# Patient Record
Sex: Female | Born: 1975 | Race: Black or African American | Hispanic: No | Marital: Single | Smoking: Current every day smoker
Health system: Southern US, Community
[De-identification: ages and names within clinical notes are randomized; demographics above are authoritative.]

## PROBLEM LIST (undated history)

## (undated) ENCOUNTER — Emergency Department (HOSPITAL_BASED_OUTPATIENT_CLINIC_OR_DEPARTMENT_OTHER): Admission: EM | Payer: Self-pay | Source: Home / Self Care

## (undated) DIAGNOSIS — G629 Polyneuropathy, unspecified: Secondary | ICD-10-CM

## (undated) DIAGNOSIS — I10 Essential (primary) hypertension: Secondary | ICD-10-CM

## (undated) DIAGNOSIS — E7800 Pure hypercholesterolemia, unspecified: Secondary | ICD-10-CM

## (undated) DIAGNOSIS — R569 Unspecified convulsions: Secondary | ICD-10-CM

## (undated) DIAGNOSIS — E079 Disorder of thyroid, unspecified: Secondary | ICD-10-CM

## (undated) DIAGNOSIS — F23 Brief psychotic disorder: Secondary | ICD-10-CM

## (undated) DIAGNOSIS — F319 Bipolar disorder, unspecified: Secondary | ICD-10-CM

---

## 1997-04-14 HISTORY — PX: TUBAL LIGATION: SHX77

## 1999-05-28 ENCOUNTER — Encounter: Payer: Self-pay | Admitting: Emergency Medicine

## 1999-05-28 ENCOUNTER — Encounter: Payer: Self-pay | Admitting: Internal Medicine

## 1999-05-28 ENCOUNTER — Inpatient Hospital Stay (HOSPITAL_COMMUNITY): Admission: EM | Admit: 1999-05-28 | Discharge: 1999-05-30 | Payer: Self-pay | Admitting: Emergency Medicine

## 1999-05-29 ENCOUNTER — Encounter: Payer: Self-pay | Admitting: Internal Medicine

## 1999-05-31 ENCOUNTER — Encounter (HOSPITAL_COMMUNITY): Admission: RE | Admit: 1999-05-31 | Discharge: 1999-08-29 | Payer: Self-pay | Admitting: Dentistry

## 1999-06-03 ENCOUNTER — Encounter: Admission: RE | Admit: 1999-06-03 | Discharge: 1999-06-03 | Payer: Self-pay | Admitting: Internal Medicine

## 1999-06-05 ENCOUNTER — Encounter: Admission: RE | Admit: 1999-06-05 | Discharge: 1999-06-05 | Payer: Self-pay | Admitting: Internal Medicine

## 1999-06-05 ENCOUNTER — Ambulatory Visit (HOSPITAL_COMMUNITY): Admission: RE | Admit: 1999-06-05 | Discharge: 1999-06-05 | Payer: Self-pay | Admitting: Internal Medicine

## 1999-06-05 ENCOUNTER — Encounter: Payer: Self-pay | Admitting: Internal Medicine

## 1999-06-05 ENCOUNTER — Inpatient Hospital Stay (HOSPITAL_COMMUNITY): Admission: EM | Admit: 1999-06-05 | Discharge: 1999-06-07 | Payer: Self-pay | Admitting: Emergency Medicine

## 1999-09-14 ENCOUNTER — Inpatient Hospital Stay (HOSPITAL_COMMUNITY): Admission: EM | Admit: 1999-09-14 | Discharge: 1999-09-17 | Payer: Self-pay | Admitting: Emergency Medicine

## 1999-09-25 ENCOUNTER — Encounter: Admission: RE | Admit: 1999-09-25 | Discharge: 1999-09-25 | Payer: Self-pay | Admitting: Hematology and Oncology

## 1999-10-07 ENCOUNTER — Encounter: Admission: RE | Admit: 1999-10-07 | Discharge: 1999-10-07 | Payer: Self-pay | Admitting: Internal Medicine

## 1999-10-30 ENCOUNTER — Encounter (HOSPITAL_COMMUNITY): Admission: RE | Admit: 1999-10-30 | Discharge: 2000-01-28 | Payer: Self-pay | Admitting: Dentistry

## 1999-10-31 ENCOUNTER — Inpatient Hospital Stay (HOSPITAL_COMMUNITY): Admission: AD | Admit: 1999-10-31 | Discharge: 1999-11-05 | Payer: Self-pay | Admitting: Hematology and Oncology

## 1999-10-31 ENCOUNTER — Encounter: Payer: Self-pay | Admitting: Internal Medicine

## 1999-10-31 ENCOUNTER — Encounter: Admission: RE | Admit: 1999-10-31 | Discharge: 1999-10-31 | Payer: Self-pay | Admitting: Hematology and Oncology

## 1999-10-31 ENCOUNTER — Ambulatory Visit (HOSPITAL_COMMUNITY): Admission: RE | Admit: 1999-10-31 | Discharge: 1999-10-31 | Payer: Self-pay | Admitting: Hematology and Oncology

## 1999-12-12 ENCOUNTER — Encounter: Admission: RE | Admit: 1999-12-12 | Discharge: 1999-12-12 | Payer: Self-pay | Admitting: Hematology and Oncology

## 1999-12-27 ENCOUNTER — Encounter: Admission: RE | Admit: 1999-12-27 | Discharge: 1999-12-27 | Payer: Self-pay | Admitting: Internal Medicine

## 2000-02-05 ENCOUNTER — Inpatient Hospital Stay (HOSPITAL_COMMUNITY): Admission: EM | Admit: 2000-02-05 | Discharge: 2000-02-09 | Payer: Self-pay | Admitting: Emergency Medicine

## 2000-02-05 ENCOUNTER — Encounter: Payer: Self-pay | Admitting: Emergency Medicine

## 2000-02-20 ENCOUNTER — Encounter: Admission: RE | Admit: 2000-02-20 | Discharge: 2000-02-20 | Payer: Self-pay | Admitting: Internal Medicine

## 2000-02-27 ENCOUNTER — Encounter: Admission: RE | Admit: 2000-02-27 | Discharge: 2000-02-27 | Payer: Self-pay | Admitting: Hematology and Oncology

## 2000-03-12 ENCOUNTER — Encounter: Admission: RE | Admit: 2000-03-12 | Discharge: 2000-03-12 | Payer: Self-pay | Admitting: Internal Medicine

## 2000-05-19 ENCOUNTER — Encounter: Payer: Self-pay | Admitting: Internal Medicine

## 2000-05-19 ENCOUNTER — Inpatient Hospital Stay (HOSPITAL_COMMUNITY): Admission: EM | Admit: 2000-05-19 | Discharge: 2000-05-23 | Payer: Self-pay | Admitting: Emergency Medicine

## 2000-05-19 ENCOUNTER — Encounter: Payer: Self-pay | Admitting: Emergency Medicine

## 2000-06-04 ENCOUNTER — Encounter: Admission: RE | Admit: 2000-06-04 | Discharge: 2000-06-04 | Payer: Self-pay | Admitting: Internal Medicine

## 2000-06-08 ENCOUNTER — Encounter: Payer: Self-pay | Admitting: Internal Medicine

## 2000-06-08 ENCOUNTER — Ambulatory Visit (HOSPITAL_COMMUNITY): Admission: RE | Admit: 2000-06-08 | Discharge: 2000-06-08 | Payer: Self-pay | Admitting: Internal Medicine

## 2000-06-11 ENCOUNTER — Encounter (HOSPITAL_COMMUNITY): Admission: RE | Admit: 2000-06-11 | Discharge: 2000-09-09 | Payer: Self-pay | Admitting: Dentistry

## 2000-08-31 ENCOUNTER — Encounter: Admission: RE | Admit: 2000-08-31 | Discharge: 2000-08-31 | Payer: Self-pay | Admitting: Internal Medicine

## 2000-09-01 ENCOUNTER — Encounter: Admission: RE | Admit: 2000-09-01 | Discharge: 2000-09-01 | Payer: Self-pay | Admitting: Hematology and Oncology

## 2000-09-01 ENCOUNTER — Ambulatory Visit (HOSPITAL_COMMUNITY): Admission: RE | Admit: 2000-09-01 | Discharge: 2000-09-01 | Payer: Self-pay | Admitting: Hematology and Oncology

## 2000-11-11 ENCOUNTER — Ambulatory Visit (HOSPITAL_COMMUNITY): Admission: RE | Admit: 2000-11-11 | Discharge: 2000-11-11 | Payer: Self-pay | Admitting: Internal Medicine

## 2000-11-11 ENCOUNTER — Encounter: Admission: RE | Admit: 2000-11-11 | Discharge: 2000-11-11 | Payer: Self-pay | Admitting: Internal Medicine

## 2000-12-19 ENCOUNTER — Emergency Department (HOSPITAL_COMMUNITY): Admission: EM | Admit: 2000-12-19 | Discharge: 2000-12-19 | Payer: Self-pay | Admitting: Emergency Medicine

## 2001-02-26 ENCOUNTER — Emergency Department (HOSPITAL_COMMUNITY): Admission: EM | Admit: 2001-02-26 | Discharge: 2001-02-26 | Payer: Self-pay | Admitting: Emergency Medicine

## 2001-07-26 ENCOUNTER — Encounter: Admission: RE | Admit: 2001-07-26 | Discharge: 2001-07-26 | Payer: Self-pay | Admitting: Internal Medicine

## 2001-08-30 ENCOUNTER — Encounter: Admission: RE | Admit: 2001-08-30 | Discharge: 2001-08-30 | Payer: Self-pay | Admitting: Internal Medicine

## 2001-11-01 ENCOUNTER — Encounter (HOSPITAL_COMMUNITY): Admission: RE | Admit: 2001-11-01 | Discharge: 2002-01-30 | Payer: Self-pay | Admitting: Dentistry

## 2002-05-20 ENCOUNTER — Encounter: Admission: RE | Admit: 2002-05-20 | Discharge: 2002-05-20 | Payer: Self-pay | Admitting: Internal Medicine

## 2002-06-11 ENCOUNTER — Inpatient Hospital Stay (HOSPITAL_COMMUNITY): Admission: EM | Admit: 2002-06-11 | Discharge: 2002-06-13 | Payer: Self-pay | Admitting: Emergency Medicine

## 2002-06-12 ENCOUNTER — Encounter: Payer: Self-pay | Admitting: Internal Medicine

## 2002-06-12 ENCOUNTER — Encounter: Payer: Self-pay | Admitting: Emergency Medicine

## 2002-10-03 ENCOUNTER — Encounter: Admission: RE | Admit: 2002-10-03 | Discharge: 2002-10-03 | Payer: Self-pay | Admitting: Internal Medicine

## 2003-03-21 ENCOUNTER — Encounter: Admission: RE | Admit: 2003-03-21 | Discharge: 2003-03-21 | Payer: Self-pay | Admitting: Internal Medicine

## 2003-09-18 ENCOUNTER — Emergency Department (HOSPITAL_COMMUNITY): Admission: EM | Admit: 2003-09-18 | Discharge: 2003-09-18 | Payer: Self-pay | Admitting: Family Medicine

## 2003-09-22 ENCOUNTER — Encounter: Admission: RE | Admit: 2003-09-22 | Discharge: 2003-09-22 | Payer: Self-pay | Admitting: Internal Medicine

## 2003-10-13 ENCOUNTER — Encounter: Admission: RE | Admit: 2003-10-13 | Discharge: 2003-10-13 | Payer: Self-pay | Admitting: Internal Medicine

## 2004-05-15 ENCOUNTER — Ambulatory Visit: Payer: Self-pay | Admitting: Internal Medicine

## 2005-07-15 ENCOUNTER — Emergency Department (HOSPITAL_COMMUNITY): Admission: EM | Admit: 2005-07-15 | Discharge: 2005-07-15 | Payer: Self-pay | Admitting: Emergency Medicine

## 2007-04-15 HISTORY — PX: ANKLE SURGERY: SHX546

## 2009-06-13 ENCOUNTER — Emergency Department (HOSPITAL_COMMUNITY): Admission: EM | Admit: 2009-06-13 | Discharge: 2009-06-14 | Payer: Self-pay | Admitting: Emergency Medicine

## 2010-04-08 ENCOUNTER — Emergency Department (HOSPITAL_BASED_OUTPATIENT_CLINIC_OR_DEPARTMENT_OTHER)
Admission: EM | Admit: 2010-04-08 | Discharge: 2010-04-08 | Payer: Self-pay | Source: Home / Self Care | Admitting: Emergency Medicine

## 2010-04-11 ENCOUNTER — Emergency Department (HOSPITAL_BASED_OUTPATIENT_CLINIC_OR_DEPARTMENT_OTHER)
Admission: EM | Admit: 2010-04-11 | Discharge: 2010-04-11 | Payer: Self-pay | Source: Home / Self Care | Admitting: Emergency Medicine

## 2010-06-14 ENCOUNTER — Emergency Department (HOSPITAL_BASED_OUTPATIENT_CLINIC_OR_DEPARTMENT_OTHER)
Admission: EM | Admit: 2010-06-14 | Discharge: 2010-06-14 | Disposition: A | Discharge status: Home / Self Care | Payer: Self-pay | Attending: Emergency Medicine | Admitting: Emergency Medicine

## 2010-06-14 DIAGNOSIS — K089 Disorder of teeth and supporting structures, unspecified: Secondary | ICD-10-CM | POA: Insufficient documentation

## 2010-06-14 DIAGNOSIS — K047 Periapical abscess without sinus: Secondary | ICD-10-CM | POA: Insufficient documentation

## 2010-06-14 DIAGNOSIS — E1169 Type 2 diabetes mellitus with other specified complication: Secondary | ICD-10-CM | POA: Insufficient documentation

## 2010-06-14 DIAGNOSIS — F17200 Nicotine dependence, unspecified, uncomplicated: Secondary | ICD-10-CM | POA: Insufficient documentation

## 2010-06-14 DIAGNOSIS — Z794 Long term (current) use of insulin: Secondary | ICD-10-CM | POA: Insufficient documentation

## 2010-06-14 DIAGNOSIS — Z79899 Other long term (current) drug therapy: Secondary | ICD-10-CM | POA: Insufficient documentation

## 2010-06-14 LAB — DIFFERENTIAL
Basophils Absolute: 0 10*3/uL (ref 0.0–0.1)
Basophils Relative: 0 % (ref 0–1)
Eosinophils Absolute: 0 10*3/uL (ref 0.0–0.7)
Eosinophils Relative: 0 % (ref 0–5)
Lymphs Abs: 2.3 10*3/uL (ref 0.7–4.0)
Monocytes Absolute: 0.4 10*3/uL (ref 0.1–1.0)
Monocytes Relative: 5 % (ref 3–12)
Neutro Abs: 5.9 10*3/uL (ref 1.7–7.7)
Neutrophils Relative %: 69 % (ref 43–77)

## 2010-06-14 LAB — BASIC METABOLIC PANEL
BUN: 8 mg/dL (ref 6–23)
CO2: 17 mEq/L — ABNORMAL LOW (ref 19–32)
Chloride: 105 mEq/L (ref 96–112)
GFR calc Af Amer: >60 mL/min (ref >60)
GFR calc non Af Amer: >60 mL/min (ref >60)
Potassium: 4.8 mEq/L (ref 3.5–5.1)
Sodium: 136 mEq/L (ref 135–145)

## 2010-06-14 LAB — URINALYSIS, ROUTINE W REFLEX MICROSCOPIC
Hgb urine dipstick: NEGATIVE
Leukocytes, UA: NEGATIVE
Nitrite: NEGATIVE
Protein, ur: NEGATIVE mg/dL
Specific Gravity, Urine: 1.043 — ABNORMAL HIGH (ref 1.005–1.030)
Urine Glucose, Fasting: >1000 mg/dL — AB
pH: 6 (ref 5.0–8.0)

## 2010-06-14 LAB — CBC
HCT: 36.9 % (ref 36.0–46.0)
Hemoglobin: 13.1 g/dL (ref 12.0–15.0)
MCH: 30.8 pg (ref 26.0–34.0)
MCV: 86.6 fL (ref 78.0–100.0)
Platelets: 336 10*3/uL (ref 150–400)
RBC: 4.26 MIL/uL (ref 3.87–5.11)
RDW: 13.1 % (ref 11.5–15.5)
WBC: 8.6 10*3/uL (ref 4.0–10.5)

## 2010-06-14 LAB — URINE MICROSCOPIC-ADD ON

## 2010-07-03 ENCOUNTER — Emergency Department (HOSPITAL_BASED_OUTPATIENT_CLINIC_OR_DEPARTMENT_OTHER)
Admission: EM | Admit: 2010-07-03 | Discharge: 2010-07-03 | Disposition: A | Discharge status: Home / Self Care | Payer: Self-pay | Attending: Emergency Medicine | Admitting: Emergency Medicine

## 2010-07-03 DIAGNOSIS — E119 Type 2 diabetes mellitus without complications: Secondary | ICD-10-CM | POA: Insufficient documentation

## 2010-07-03 DIAGNOSIS — N764 Abscess of vulva: Secondary | ICD-10-CM | POA: Insufficient documentation

## 2010-07-08 LAB — URINE MICROSCOPIC-ADD ON

## 2010-07-08 LAB — URINALYSIS, ROUTINE W REFLEX MICROSCOPIC
Ketones, ur: NEGATIVE mg/dL
Leukocytes, UA: NEGATIVE
Nitrite: NEGATIVE
Specific Gravity, Urine: 1.037 — ABNORMAL HIGH (ref 1.005–1.030)
pH: 5.5 (ref 5.0–8.0)

## 2010-07-08 LAB — GLUCOSE, CAPILLARY: Glucose-Capillary: 109 mg/dL — ABNORMAL HIGH (ref 70–99)

## 2010-08-30 NOTE — Discharge Summary (Signed)
[place]. [facility]  Patient:    Zoe Clay, Zoe Clay                     MRN: [id number] Adm. Date:  [id number] Disc. Date: [id number] Attending:  Phifer, Harriett Sine Welcome Dictator:   Zella Ball, M.D.                           Discharge Summary  DISCHARGE DIAGNOSES: 1. Diabetic ketoacidosis. 2. Insulin-dependent diabetes mellitus. 3. Hypovolemia. 4. Graves disease. 5. 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 6. History of noncompliance. 7. Signed out against medical advice.  DISCHARGE MEDICATIONS: 1. PTU 200 mg p.o. t.i.d. 2. Propranolol 40 mg p.o. t.i.d. 3. Insulin Regular/NPH (70/30) 40 units subcu q.a.m. and 20 units subcu q.p.m.  SPECIAL DISCHARGE INSTRUCTIONS:  The patient was to check her blood sugars three times per day and she was to be called at home to schedule thyroid ablation.  PROCEDURES IN HOSPITAL:  None.  CONSULTATIONS IN HOSPITAL:  None.  PRESENTING HISTORY:  This is a 35 year old African-American female well known to the medicine service secondary to multiple admissions for diabetic ketoacidosis and thyrotoxicosis, who presented with a chief complaint of nausea, vomiting, confusion, and loss of consciousness.  The patient was discharged from [facility] the day before presentation to [facility] for a similar presentation where she was unconscious secondary to diabetic ketoacidosis.  The patient stated that she was discharged from [facility] the day before presentation, but "forgot" her medications at [facility] and therefore did not have any of her medications at home and began feeling weak and nauseous.  She eventually became comatose.  She was brought to the emergency department by her boyfriend.  For full history and physical, please see dictation of February 05, 2000.  ADMISSION LABORATORY DATA:  On initial presentation, the  patient was found to have labs of sodium 136, potassium 6.8, chloride 110, bicarbonate 4, BUN 19, and glucose greater than 700.  The initial blood gas showed a pH of 7.04, a pCO2 of 14, and a pO2 of 112.  Liver enzymes:  AST 19, ALT 20, total protein 7.1, alkaline phosphatase 227, total bilirubin 2.7.  The CBC showed a white count of 13.9 and hemoglobin 11.9.  The UA showed a glucose greater than 1000, ketones greater than 800, and nothing on microscopic analysis.  The EKG and chest x-ray on admission were negative.  The patient received an amp of sodium bicarbonate in the emergency department, as well as 10 units of subcutaneous insulin and was initially placed on Glucometer with an initial drip rate of 13 units/hr.  The medicine service saw her and she was admitted for management of diabetic ketoacidosis.  HOSPITAL COURSE: #1 - DIABETIC KETOACIDOSIS:  The patient was admitted to the MICU and begun on an insulin drip of 5 units of Regular per hour.  She was also begun on PTU 100 mg p.o. q.6h.  Her progress was followed by CBGs q.1h. and BMETs q.3h.  When the patients glucose dropped to less than 300, her fluids were changed to D5 normal saline and she was replaced p.r.n. with potassium.  The patient improved over the course of the ensuing 48 hours and was transferred to a floor bed after her bicarbonate had stabilized over 15.  She was begun then on Insulin 70/30 40 units q.a.m. and 20 units  q.p.m.  She was doing quite well, however, the patient then decided to sign out against medical advice shortly after transfer to a regular bed.  She was advised to continue to take her Insulin 70/30 and to follow up as soon as possible as an outpatient.  She was given a prescription for the 70/30 prior to discharge.  #2 - GRAVES DISEASE:  The patient had been diagnosed with Graves disease in 2000 and it was felt that she was in thyroid storm on presentation as she had gone without PTU for greater  than 30 hours.  She was started on 200 mg of PTU and then given 100 mg q.6h.  Luckily there was a very similar admission to this very recently, which Fritzi Mandes, M.D., her endocrinologist, wrote a very helpful consult note, so as this admission was pretty similar to that admission, his advice on that previous admission was followed.  As stated previously, the patient did sign out AMA.  She therefore was instructed to continue her PTU 200 mg t.i.d.  It was also felt after telephone discussion with Fritzi Mandes, M.D., that the patient would most likely benefit from thyroid ablation and the patient was informed that we would call her to schedule this as an outpatient.  #3 - HISTORY OF NONCOMPLIANCE AND LOW LEVEL OF FUNCTIONING, LIKELY MILD MENTAL RETARDATION:  The patient has a history of multiple, multiple admissions for DKA.  She really seems quite unable to handle her illness and unwilling to take responsibility for it when it does occur.  This was evidenced when she did sign out against medical advice.  DISCHARGE LABORATORY DATA:  WBC 4.2, hemoglobin 8.5, platelets 280. Sodium 143, potassium 3.9, chloride 114, bicarbonate 25, glucose 137, BUN less than 3, creatinine 0.3, calcium 7.4.  TSH less than 0.019.  Alkaline phosphatase 227, ALT 20, total bilirubin 2.7, prealbumin 10.8.  The urine pregnancy test was negative. DD:  08/13/00 TD:  08/15/00 Job: [id number]

## 2010-08-30 NOTE — Discharge Summary (Signed)
[place]. [facility]  Patient:    Zoe Clay, Zoe Clay                       MRN: [id number] Adm. Date:  [id number] Disc. Date: [id number] Attending:  Alfonso Ramus Dictator:   Janyce Llanos, M.D.                           Discharge Summary  DISCHARGE DIAGNOSES: 1. Insulin-dependent diabetes mellitus with diabetic ketoacidosis. 2. Graves disease with thyrotoxicosis. 3. Major depression.  DISCHARGE MEDICATIONS: 1. Zoloft 50 mg p.o. q.d. 2. Insulin Lispro 4 units subcutaneously t.i.d. q.a.c. 3. Insulin Lantus 13 units subcutaneously q.h.s. 4. Tapazole 30 mg p.o. q.d. 5. Propranolol 60 mg p.o. b.i.d.  PROCEDURES:  None.  CONSULTATIONS:  Obtained from endocrinology regarding this patients thyrotoxicosis as well as insulin-dependent diabetes.  Dr. Criss Alvine performed this consult and recommended the change in her insulin regimen on which she was discharged.  Furthermore, he recommended the use of Tapazole with a 30% remission of Graves disease over one year.  He recommended doing this prior to considering radiation ablation of the thyroid.  ADMISSION HISTORY AND PHYSICAL:  The patient is a 35 year old African-American female with a history of multiple admissions for diabetic ketoacidosis and thyrotoxicosis who presented to the outpatient clinic on the morning of admission complaining of feeling bad with abdominal pain and vomiting for the previous two days.  The patient last took her insulin the day prior to admission and has not taken any insulin since.  Since then, the patient has felt too poorly to take anything p.o. including her medications.  PAST MEDICAL HISTORY: 1. Type 1 diabetes with multiple admissions for diabetic ketoacidosis.  The    patient has been diabetic for approximately one and one-half years. 2. Graves disease with her last TSH being less than 0.019 and a free T4 of    7.98 in June 2001.  Graves disease was diagnosed in October 2000. 3.  Medicine noncompliance. 4. Iron-deficiency anemia. 5. Major depression.  FAMILY HISTORY:  The patients father died at age 46 with some sort of heart problem that was related to his thyroid dysfunction.  Her grandmother passed away with breast cancer, and her grandfather had colon cancer.  SOCIAL HISTORY:  The patient lives with her three children and sometimes with her boyfriend.  The patients mother assists with the children and also helps Randee with her medicines.  The patient denies any tobacco, alcohol, or illicit drug use.  REVIEW OF SYSTEMS:  Noncontributory.  MEDICATIONS ON ADMISSION: 1. Insulin 70/30, 27 units in the morning and 22 units at night. 2. PTU 50 mg p.o. b.i.d. 3. Propranolol 60 mg p.o. b.i.d. 4. Ferrous sulfate 325 mg p.o. b.i.d.  ALLERGIES:  She is not allergic to any medicines.  PHYSICAL EXAMINATION ON ADMISSION:  VITAL SIGNS:  Temperature 97.0, blood pressure 131/68, right-handed 133, respiratory rate 25, 100% saturated on room air.  GENERAL:  This is an ill-appearing 35 year old African-American female who is lying supine in mild distress.  HEENT:  Marked bilateral proptosis.  Pupils are equal, round and reactive to light.  Throat is clear without exudate.  NECK:  No lymphadenopathy.  CARDIOVASCULAR:  Tachycardic but regular with a normal S1 and S2.  PMI is not displaced; however, the precordium is hyperactive and bounding.  There is a 2/6 aortic systolic murmur.  There is no JVD.  RESPIRATORY:  Clear to  auscultation bilaterally.  ABDOMEN:  Positive bowel sounds.  Diffusely tender.  There is no hepatosplenomegaly, no mass, no rebound, no guarding.  EXTREMITIES:  Without edema.  NEUROLOGIC:  She is awake and oriented x 3.  Strength and sensation unable to be obtained due to the patients somnolence.  Cranial nerves II-XII intact. Babinskis downgoing bilaterally.  LABORATORY DATA:  Initial ABG on room air is pH 7.17, PC02 25.6, P02 105,  and bicarb 9.2.  Initial blood glucose was 738.  Initial potassium 5.1, sodium 128 with an anion gap greater than 28.  CBC: White count 10.4, hemoglobin 9.1, hematocrit 28.2, platelets 434, reticulocytes 1.0.  B12 was 380, iron 66, TIBC 157.  HOSPITAL COURSE:  The patient was admitted to the stepdown unit on an insulin drip for her diabetic ketoacidosis.  The drip was started at 5 units per hour; however, the anion gap closed rather quickly, and this drip was quickly tapered off, and fluids were changed to D5 normal saline over a time span of about 6 hours.  For her thyrotoxicosis, propranolol was given p.o. as well as IV.  Heart rate declined into the low 100s with a normal systolic blood pressure of 120 to 130.  Endocrinology consult was obtained for her insulin-dependent diabetes as well as her Graves disease and thyrotoxicosis.  Dr. Criss Alvine recommended changing her insulin regimen to insulin Lantus as well as Lispro and suggested that we try a years therapy on Tapazole instead of PTU which may offer up to 30% remission over the course of one year.  He recommended doing this prior to considering radioactive ablation of the patients thyroid.  The patient is to followup with Dr. Felicity Coyer in the clinic.  Since medication noncompliance has been such an issue with this patient, radioactive thyroid ablation would not be a bad option.  DISCHARGE LABORATORY DATA:  Sodium 132, potassium 3.8, chloride 97, bicarb 29, BUN 8, creatinine 0.4, glucose 265.  White count 7.3, hemoglobin 11.4, hematocrit 34.7, platelets 330.  The patient was discharged with home health to help her with her new medication regimen, and she is to follow up with Dr. Felicity Coyer on August 16. DD:  11/11/99 TD:  11/12/99 Job: [id number]

## 2010-08-30 NOTE — Discharge Summary (Signed)
[place]. [facility]  Patient:    Zoe Clay, Zoe Clay                       MRN: [id number] Adm. Date:  05/19/00 Disc. Date: 05/23/00 Attending:  Alvester Morin, M.D. Dictator:   Doreatha Martin, M.D.                           Discharge Summary  DISCHARGE DIAGNOSES: 1. Diabetic ketoacidosis. 2. Hypoglycemia. 3. Anemia. 4. Hypothyroidism. 5. Diabetes mellitus type 1.  DISCHARGE MEDICATIONS: 1. PTU 200 mg t.i.d. 2. Propanolol 40 mg t.i.d. 3. Regular Insulin 10 units before every meal. 4. Lantis 15 units at bedtime.  HISTORY OF PRESENT ILLNESS:  Ms. Sprung is a 35 year old African-American female with a two-year history of type 1 diabetes mellitus with multiple admissions for DKA and history of Graves disease who presents with a three-day history of nausea and vomiting.  The patient states that she was in her usual state of health until three days prior to admission when she became increasingly nauseated and began having bouts of emesis.  She states that she has not missed her insulin doses and that her home CBGs were within normal limits.  However, on the morning of admission she found her CBG to be greater than 500.  At that time, the patient took an extra 10 unit dose of Regular Insulin and decided to come to the emergency room.  REVIEW OF SYSTEMS:  The patient denies any productive cough, fever, dysuria, rashes, or diarrhea.  The patient does have occasional chills and occasional stabbing chest pain and abdominal pain.  The abdominal pain began when she started having emesis.  She denies any hematemesis.  No exertional chest pain. The patient states that she has been compliant with all of her medications.  PAST MEDICAL HISTORY: 1. Type 1 diabetes mellitus diagnosed in August of 2000 with multiple    admissions for DKA and leaving against medical advice. 2. Graves disease diagnosed in September of 2000 with arrangements for    possible ablation of  the thyroid, however, patient noncompliant and    leaving against medical advice on more than one occasion. 3. History of anemia. 4. Bilateral tubal ligation in 1999. 5. History of depression. 6. History of gastritis. 7. 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  SOCIAL HISTORY:  The patient lives in [place] with fiance and children. The patient is unemployed.  She denies alcohol, tobacco use, or drug use.  FAMILY HISTORY:  Significant for a relative with diabetes mellitus.  PHYSICAL EXAMINATION:  VITAL SIGNS: Temperature 96.7, blood pressure 138/69, pulse 162, respirations 26.  GENERAL: This is a young, African-American female who is slow to respond.  Alert and oriented x 3.  HEENT: Normocephalic, atraumatic, PERRL.  Pink, moist, mucous membranes.  No sinus tenderness. Bilateral tympanic membranes clear.  NECK: No JVD.  Slightly enlarged thyroid. Supple neck.  HEART: Tachycardic with regular rhythm, no murmurs, rubs, or gallops.  LUNGS: Clear to auscultation bilaterally.  No crackles and no wheezing.  ABDOMEN: Soft, nontender, and nondistended with active bowel sounds.  EXTREMITIES: Trace lower extremity edema bilaterally.  No cords. SKIN: No rashes or ulcers or lesions.  LABORATORY DATA:  ABG; pH 7.094, pCO2 21, pO2 116, bicarb 6.  Sodium 141, potassium 4.6, chloride 114, CO2 8, BUN 14, glucose 482.  Chest x-ray clear bilaterally.  EKG sinus tachycardia with rate of 150, no ST  or T wave changes.  HOSPITAL COURSE:  #1 - DKA.  The patient was admitted to critical care unit and a central line was placed for replenishment of her fluids.  The patient responded very well to insulin drip and fluids.  Within the next 24 hours, her anion gap had closed and her bicarbonate level was 19.  As well she was taking p.o. well on the following morning.  At that time, the patient was transferred to regular bed for further monitoring.  It is felt that the DKA occurred secondary  to noncompliance with her medications as there was no evidence of infection or any other stress.  #2 - Diabetes mellitus type 1.  This patient had episodes of hypoglycemia when prescribed NPH Insulin.  She was then switched to 70/30 Insulin and she also had episodes of hypoglycemia that were asymptomatic.  Therefore, it was determined that this patient is not able to tolerate longacting insulin preparations and was placed on Regular Insulin just before meals three times a day and Lantis 15 units at bedtime.  The patient is to follow up in the [facility].  She has been instructed to seek medical care in the event that her blood sugars are elevated and/or below 60.  She has also been encouraged to comply with her medical therapy.  #3 - Hypothyroidism/Graves disease.  The patient had tachycardia at the time of admission.  It is believed that this is also secondary to noncompliance with medical prescriptions.  This could also have been secondary to stress of acute illness.  The patient was scheduled for thyroid uptake scan two to three weeks after discharge.  The patient is to continue with PTU and propanolol. She will follow up with Dr. Mady Haagensen in the outpatient clinic.  #4 - Anemia.  This patient has longstanding anemia.  There was no evidence of active bleeding at the time of admission.  This problem is to be followed by her primary care physician as an outpatient.DD:  10/06/00 TD:  10/06/00 Job: [id number]

## 2010-08-30 NOTE — H&P (Signed)
[place]. [facility]  Patient:    Zoe Clay, Zoe Clay                       MRN: [id number] Adm. Date:  [id number] Disc. Date: [id number] Attending:  Alfonso Ramus Dictator:   Milana Huntsman, M.D.                         History and Physical  DATE OF BIRTH:  06-23-1975  CHIEF COMPLAINT:  Nausea, vomiting, obtundation.  HISTORY OF PRESENT ILLNESS:  This is a 35 year old African-American female very well known to the MED B service secondary to multiple admissions for diabetic ketoacidosis and thyrotoxicosis who presents today with chief complaint of nausea, vomiting, and confusion/eventual loss of consciousness. Patient had been discharged from [facility] yesterday after being admitted there on October 21 for a similar presentation of unconsciousness secondary to diabetic ketoacidosis.  Patient states that she was discharged from [facility] yesterday morning at about 11 oclock, but she left there without her medications and, subsequently, did not have any of the medications that they gave her at home.  However, she states she did have her insulin which she continued to take when she was home. She states she took Lantus insulin 10 units q.h.s. and Humalog 5 units q.a.c. She said the only medication that she believes she missed was her thyroid medications of which she was unclear what she should be taking.  Patient states that she had been checking her blood sugars at home and they had been in the 200 to 300 range yesterday evening but then decreased to about 100 overnight and, when her boyfriend left for work this morning at approximately 6 oclock, she was feeling still fairly well.  However, she states she did take her morning insulin dose but, then at about 9 oclock in the morning, she said she started to get confused, began feeling nauseous and vomiting, and these symptoms continued until she decided to  present to the [facility] emergency department at about 1 p.m.  She then called her boyfriend who brought her to the emergency department and, by the time she arrived at the [facility] emergency department, she was unconscious.  This patients recent history is significant for her type 1 diabetes diagnosed in August 2000 and Graves disease in September 2000 since which time she has been admitted apparently almost monthly either at this hospital, [facility], or at [facility].  In reading through her old charts, there seems to be some concern that she is either noncompliant with her medications or unable to fully understand the medication regimen that she needs to be on.  At any rate, she is hospitalized for this problem quite often.  PAST MEDICAL HISTORY: 1. Type 1 diabetes diagnosed in August 2000 with multiple DKA admissions as    stated above. 2. Graves disease diagnosed in September 2000. 3. History of anemia. 4. Depression. 5. History of gastritis.  PAST SURGICAL HISTORY:  Patient is status post bilateral tubal ligation.  MEDICATIONS:  Some conflicting information here between the patient and her [facility] history and physical, but, per the patient, she is taking Lantus 10 units q.h.s. and Humalog 5 units q.a.c.  She does not know what her thyroid medications are.  Per [facility] records, her medications are: 1. Lente 10 units. 2. Humalog 25 units q.a.c. 3.  Propythiouracil or PTU 15 mg q.i.d. 4. Propranolol 40 mg t.i.d. 5. Pepcid 20 mg b.i.d.  SOCIAL HISTORY:  Patient lives in [place] with her three children.  Her mother lives nearby and looks in on her and apparently helps her with her medication.  She has a boyfriend who is the father of one of her children and she has been unemployed for the past several months secondary to her illnesses.  She denies tobacco, alcohol, or illicit drug use.  FAMILY HISTORY:  Father died of  diabetes-related complications in his 21s.  PHYSICAL EXAMINATION:  VITAL SIGNS:  Temperature 97.8, blood pressure 121/58, pulse 150, respiratory rate 28.  Patient saturating 100% on room air.  GENERAL:  This is a lethargic African-American female with slurred speech who will verbally respond to questions when asked.  HEENT:  Pupils equal and reactive to light, positive ophthalmus and mucous membranes pink, somewhat dry.  NECK:  No JVD, slight goiter.  CARDIOVASCULAR:  Tachycardic, regular rhythm, no murmurs, rubs, or gallops noted.  LUNGS:  Clear to auscultation bilaterally.  ABDOMEN:  Soft without guarding, positive bowel sounds, slightly tender to palpation on upper midline.  EXTREMITIES:  Trace edema.  LABORATORIES ON PRESENTATION:  Patients initial i-STAT showed a sodium 136, potassium 6.8, chloride 110, bicarbonate 4, BUN 19, and glucose greater than 700.  Repeat for official was sodium 135, potassium 6.6, chloride 103, bicarbonate less than 10, BUN 21, creatinine 1.2, and glucose 747.  Initial blood gas showed a pH of 7.04, PCO2 of 14.2, and a PO2 of 112.  Liver enzymes: AST 19, ALT 20, total protein 7.1, alkaline phosphatase 227, total bilirubin 2.7.  CBC:  White count of 13.6, hemoglobin 11.9, platelets of 516. Urinalysis:  Glucose greater than 1000, ketones greater than 80.  Nothing on micro.  EKG showed sinus tachycardia.  Chest x-ray showed no acute distress.  EMERGENCY DEPARTMENT COURSE:  Patient was given one amp of sodium bicarbonate in the emergency department as well as 10 units of subcutaneous insulin and placed on the Glucomander initially at a drip rate of 13 units an hour.  ASSESSMENT AND PLAN:  This is a 35 year old African-American female with diabetic ketoacidosis with thyrotoxicosis who was discharged yesterday from [facility] for the same illness.  Problem 1. Diabetic ketoacidosis.  Patients serum blood sugars are trending down,  now less than 200.  So, when patient is in the 250 to 300 range, will start D5 half normal  saline as well as potassium and will follow capillary blood glucoses with q. hour capillary blood glucose checks and q. three hour basic metabolic panels. Patient has already had roughly 4 L of fluid and has a good urine output, so we will decrease her IV fluid rate to 400 cc per hour.  Problem 2. Thyrotoxicosis.  Impression is that her diabetic ketoacidosis was precipitated by her thyrotoxicosis as she has been apparently without her PTU for greater than 30 hours.  We gave her one 200 mg dose of PTU in the emergency department and now plan to give her 100 mg q.6h. until she has normalized.  On a previous admission, Dr. Criss Alvine saw the patient and wrote out a differential of continued medical management of her thyrotoxicosis versus thyroid ablation therapy, and it seems that this ablation therapy might be something that we should address with the patient during this hospitalization.  However, with her history of noncompliance, this could be a problem and will have to really look into  the strength of her social support and if she would be able to take thyroid medications on a regular basis, unclear which way would be worse for her. Problem 3. Slightly increased white count.  Apparently, patient was discharged from [facility] with Ciprofloxacin.  However, it is unclear why she had that.  There is nothing on urinalysis to suggest urinary tract infection; however, we do have blood cultures pending and will follow her left shift as her diabetic ketoacidosis resolves.  Problem 4. Slightly increased creatinine/ increased alkaline phosphatase/increased total bilirubin.  Believe this is secondary to dehydration and will follow by labs. DD:  02/05/00 TD:  02/05/00 Job: [id number]

## 2010-08-30 NOTE — Discharge Summary (Signed)
[place]. [facility]  Patient:    Zoe Clay, Zoe Clay                       MRN: [id number] Adm. Date:  [id number] Disc. Date: [id number] Attending:  Madaline Clay Dictator:   Zoe Clay CC:         Zoe Clay/outpatient clinic             Zoe Clay, M.D./dental medicine, [facility]                           Discharge Summary  DISCHARGE DIAGNOSES: 1. Diabetes mellitus, type 1, with diabetic ketoacidosis. 2. Hyperthyroidism. 3. Dental abscess. 4. Normocytic anemia with hemoglobin 10.8.  DISCHARGE MEDICATIONS: 1. Amoxicillin 500 mg p.o. t.i.d. x 12 days for a total of 14 days of treatment. 2. Propranolol 60 mg p.o. t.i.d. 3. Propylthiouracil (PTU) 100 mg p.o. t.i.d. 4. Ramipril 2.5 mg p.o. q.d. 5. 70/30 insulin 20 units q.a.m. and 15 units q.p.m. 6. ______ rinse mouth t.i.d. until bottle completed. 7. Darvocet-N 100 one p.o. q.6h. p.r.n. pain, dispensed #15.  SPECIAL INSTRUCTIONS: 1. Diabetic diet, as discussed, with hospital nutritionist. 2. Follow-up:  Zoe Clay on Monday, 06/03/99 at 2 p.m. in the [facility] clinic. 3. Appointment w/Zoe Clay on Thursday, 06/20/99, at 4 p.m. in [facility] clinic 4. Appointment w/Dr. Kristin Clay, 06/05/99, at 10:15 a.m. in dental medicine clinic. 5. [facility] on 06/04/99 at 10 a.m.  PROCEDURES: 1. Panorex on 05/29/99 revealed periapical abscesses forming at the roots of the right first mandibular molar with the presence of multiple dental caries.  2. Two-view PA and lateral chest x-ray on 05/28/99 revealed a normal chest x-ray.  3. Right femoral line placement on 05/28/99.  Please refer to Dr. ______s note n the hospital chart.  HISTORY OF PRESENT ILLNESS:  Zoe Clay is a 35 year old African-American female  with newly diagnosed insulin dependent diabetes mellitus and hyperthyroidism. he patient presented two weeks ago, prior to admission at [facility] with chief complaint  of weakness and palpitation.  The patient also reported a 0- pound weight loss over the past 1-1/2 months.  She also complained of intermittent shortness of breath, nausea with one episode of emesis prior to admission.  The  patient also complained of intense diaphoresis, nervousness and heat intolerance. Zoe Clay further complaints included a toothache, which she said had been present for the past two days.  She denied any fever, chills, night sweats, chest pain,  abdominal pain, hematemesis, melena, hematochezia, diarrhea or dysuria.  PHYSICAL EXAMINATION:  GENERAL:  She is a cachectic, lethargic and ill-appearing African-American female with prominent proptosis.  VITAL SIGNS:  Temperature 97.3, blood pressure 143/78, pulse 108 to 152, respirations 18 and pulse ox 99% on room air.  HEENT:  Normocephalic, atraumatic.  Pupils equal, round and reactive to light and accommodation; extraocular movements intact.  Positive for conjunctival pallor, and icteric.  Oropharynx revealed dry mucous membranes.  No ulcers or exudate.  NECK:  Supple, without any lymphadenopathy; positive for a smooth nontender thyroid goiter.  No bruits.  LUNGS:  Clear to auscultation bilaterally.  CARDIOVASCULAR:  Revealed prominent tachycardia; no evidence of murmurs, gallops or rubs.  ABDOMEN:  Soft, nontender/nondistended, with decreased bowel sounds.  No hepato- splenomegaly appreciated.  EXTREMITIES:  No edema; nontender, with 1+ dorsalis pedis and posterior tibial bilaterally.  NEUROLOGIC:  She was alert and oriented x 3.  Cranial nerves were grossly intact; exam was nonfocal.  ADMISSION LABORATORY DATA SUMMARY:  Sodium 129, potassium 4.6, chloride 102, CO2 was 14, glucose 446, BUN 14, creatinine 0.8, calcium 9.0 and Total protein 6.6,  albumin 3.1, AST 27, ALT 30, alk phos 142, Total bilirubin 1.6.  Admission EKG revealed sinus tachycardia with a heart rate of 153.  Serum acetone  was read as moderate.  HbA1C of 11.2.  Serial CK and CM MB are as follows: CK #1 was 19 and MB 1.1 with Troponin-I less than 0.03. CK #2 was 19 and MB 0.8 with Troponin-I less than 0.03. CK #3 was 19 and MB 0.6 with Troponin-I ______. CK #4 was 17 and MB 0.3 with Troponin-I less than 0.03.  TSH less than 0.019, free T4 was 2.32 (reference range 0.89 to 1.80) and T3 was  285.0 (reference range 60.0 to 181.0).  Urine pregnancy negative.  Urine drug screen negative.  Serial BMPs can be referenced in the laboratory section of the chart.  Urinalysis revealed glucose greater than 1000 and ketones greater than 80; other- wise, within normal limits.  BMP from the morning of 05/30/99: sodium 138, potassium 3.3, chloride 111, CO2 as 25, BUN 4, creatinine 0.3, glucose 276.  HOSPITAL COURSE:  By problem: 1. Diabetic ketoacidosis. The patient presented in diabetic ketoacidosis as evidenced by her bicarbonate level of 14 with an anion gap of 13.  The patient was admitted to the ICU where she was given IV fluids for rehydration and was started on an insulin drip.  The patient was monitored with q.1h. capillary blood glucoses and q.2h. BMPs.  The patient responded to the aforementioned intervention.  The patients bicarbonate  returned to normal as indicated, and her listed labs and her anion gap closed. The patients diabetic ketoacidosis resolved with the fluid resuscitation and insulin drip.  The patient was switched to her home insulin schedule once she had corrected.  The patient tolerated a right femoral line placement for her fluid rehydration without any problems.  The patient did develop a transient hypokalemia with correction of her DKA, which was corrected with p.o. potassium chloride replacement.  The patient underwent extensive diabetic counseling and teaching during her hospital course.  Our hospital nutrition team discussed her diabetes with her in detail.  I also  discussed the diagnosis of diabetes and the lifestyle changes that  were required to maintain good health with diabetes.  I also was able to discuss this with Ms. Ventresca mother, who stated that she understood the importance of appropriate medical follow-up and insulin treatment.  The patient was scheduled  continued outpatient education with our diabetic management team, as listed in he follow-up section.  2. Hyperthyroidism. The patient had clinical findings suggestive of Graves hyperthyroidism.  The patients blood pressure and heart rate were controlled with Inderal 60 mg p.o. t.i.d.  She was also continued on her admission PTU dosages as were started at [facility].  I instructed the patient to continue her Inderal at the listed dose, but to closely monitor her pulse and told her to be wary of any symptoms of hypotension, including light-headedness.  The patient stated she would contact me if she had any of the aforementioned symptoms.  3. Dental abscess. It is likely that Ms. Laszlo dental abscess contributed to her DKA.  The patient  was started on amoxicillin during her hospital course and had follow-up with Dr. Kristin Clay.  Thankfully, Dr. Kristin Clay with dental medicine saw Ms. Meuser and  recommended that we treat her dental abscess with amoxicillin and ______  rinse  until her infection had resolved.  He agreed to see Ms. Scheidegger in his outpatient  clinic for further treatment, including possible extraction.  Ms. Gangemi was given the follow-up with Dr. Kristin Clay, as listed above.  4. Normocytic anemia. The patient is a menstruating young female, which is not likely the etiology of her normocytic anemia.  However, Ms. Vanosdol anemia may also be influenced by her hyper- thyroidism. DD:  07/04/99 TD:  07/04/99 Job: [id number]

## 2010-08-30 NOTE — Discharge Summary (Signed)
[place]. [facility]  Patient:    Zoe Clay, Zoe Clay                       MRN: [id number] Adm. Date:  [id number] Disc. Date: [id number] Attending:  Madaline Guthrie Dictator:   Rene Paci, M.D. CC:         Madaline Guthrie, M.D.                           Discharge Summary  DISCHARGE DIAGNOSES: 1. Status post diabetic ketoacidosis. 2. Type 1 diabetes, poorly controlled. 3. Graves disease. 4. Iron deficiency anemia. 5. Depression.  DISCHARGE MEDICATIONS: 1. Insulin 70/30 25 units q.a.m. and 20 units q.p.m. 2. Propranolol 60 mg one p.o. b.i.d. 3. PTU 50 mg three tablets p.o. b.i.d. 4. Zoloft 50 mg one tablet p.o. q.h.s. 5. Iron sulfate 325 mg one tablet p.o. b.i.d.  FOLLOWUP:  Scheduled for Wednesday, June 13, at 2 p.m. at the outpatient clinic.  At that appointment, Dr. Felicity Coyer will review log of CBGs as well as symptoms of hyperthyroidism.  PROCEDURES:  None.  CONSULTATIONS:  None.  HISTORY OF PRESENT ILLNESS:  This is a 35 year old woman with diabetes diagnosed in January 2001, with history of multiple DKA episodes who presents to [facility] with complaint of vomiting numerous times the morning of admission but no hematemesis.  She has been unable to keep anything down including liquids.  She reports running out of her insulin yesterday and says she has only missed two doses.  She says that she feels cramping in her abdomen, shaking all over and like her heart is racing.  She also claims polydipsia and polyuria.  CBG this morning before coming to the emergency department was 464.  She denies any fevers, chills, chest pain, cough or pleurisy.  She denies any diarrhea, dysuria or vaginal discharge.  SOCIAL HISTORY:  She lives with her three children and boyfriend.  The mother lives near and checks in on her to help ensure medical compliance.  The mother is responsible for obtaining the patients medications.  The patient has been unemployed  since January 2001, when she was diagnosed with diabetes.  She denies any alcohol, drugs or tobacco.  PHYSICAL EXAMINATION:  GENERAL:  She is a thin, tremulous, ill-appearing young woman.  VITAL SIGNS:  Temperature 97.3, blood pressure 144/75, heart rate 140-166, respiratory rate 24, saturations 100% on room air.  HEENT:  Reveals mild to moderate proptosis with exophthalmos, pupils are equal, round and reactive to light, no lid lag.  Oropharynx is clear.  NECK:  Obvious visible goiter.  Mildly tender to palpation.  No thyroid nodules or bruits.  No LAD.  Neck is supple with full range of motion.  CARDIAC:  Tachycardic with normal S1 and S2.  LUNGS:  Clear to auscultation bilaterally.  ABDOMEN:  Soft, nontender with good bowel sounds, though the patient has voluntary guarding over the epigastric area.  EXTREMITIES:  Warm without edema.  NEUROLOGIC:  She has brisk biceps with patellar reflexes.  No gross deficits.  LABORATORY DATA AND X-RAY FINDINGS:  ABG of pH 7.22, pCO2 37, pO2 129.  Sodium 135, potassium 6.2, chloride 98, CO2 16, BUN 18, creatinine 0.9, glucose 491. Alk phos of 194, total bilirubin 2.4.  Anion gap of 21.  Amylase and lipase normal.  Urinalysis with greater than 1000 glucose, greater than 80 ketones and negative for leukocyte esterase or nitrites.  Urine  pregnancy test was negative.  HOSPITAL COURSE:  #1 - DIABETIC KETOACIDOSIS:  The patient was admitted to step-down after being given 10 units of regular IV bolus.  She was begun on an insulin drip and followed by protocol with CBGs every hour and BMP every two hours to follow her bicarb, potassium as well as glucose.  Potassium was repleted as the acidosis corrected.  Her IV fluids were changed to normal saline and CBG was less than 200.  She was rehydrated aggressively until CO2 was greater than 20 with two BMPs in a row.  The acidosis was corrected without difficulty.  #2 - DIABETES MELLITUS TYPE 1,  POORLY CONTROLLED:  The patient needed continued teaching for improved compliance with her insulin regimen.  The patient is somewhat immature and fails to take responsibility for her own disease and in fact is very depressed believing that his not fair that these diseases have happened to her.  She blames her mother for running out of insulin and much effort was spent during this hospitalization in trying to encourage the patient to take responsibility for her own wellness.  Her insulin doses were adjusted and slightly increased from the time of admission. She will now be taking 20 units in the evening as well as the previous 25 units a.m.  #3 - HYPERTHYROIDISM/GRAVES DISEASE:  This continues to be a patient problems as these symptoms of tachycardia, being jittery and heat intolerance are at times unbearable to the patient.  We felt that compliance might also be improved with b.i.d. dosing, so the biggest change made was changing her from t.i.d. dosing to b.i.d. of her PTU and Inderal.  Otherwise, the dose of medications have not been changed during this hospitalization.  #4 - DEPRESSION:  The patient previously had been prescribed Zoloft at 25 mg daily, but says that she has not been taking this medication for some weeks as she does not believe it has helped her at all.  As the patient appears to have symptoms of depression and helplessness related to her other diseases, Zoloft was restarted during this hospitalization at a higher dose of 50 mg q.d.  The patient already reports feeling better and says she will continue these medications on an outpatient basis.  #5 - ANEMIA:  Although the patient was initially heme concentrated with a hemoglobin of 13.6 secondary to dehydration, her final hemoglobin was 8.5 when the patient was stabilized.  Iron levels were checked and the patient was found to be significantly iron deficient with a low ferritin level and a high TIBC level.  Therefore,  iron sulfate was added to the patients regimen and  she will continue to take one tablet twice daily as an outpatient for improvement of her iron deficiency anemia.  Overall, the problem is likely secondary to heavy menses. DD:  09/25/99 TD:  09/29/99 Job: [id number]

## 2010-08-30 NOTE — Discharge Summary (Signed)
[place]. [facility]  Patient:    Zoe Clay, Zoe Clay                       MRN: [id number] Adm. Date:  [id number] Disc. Date: [id number] Attending:  Madaline Guthrie Dictator:   Dr. Remer Macho CC:         Dr. Mizelle/outpatient clinic             Adelene Amas. [place], M.D.             Cindra Eves, M.D./dental medicine                           Discharge Summary  DISCHARGE DIAGNOSES: 1. Diabetes mellitus, type 1 - status post diabetic ketoacidosis. 2. Hyperthyroidism. 3. Medical noncompliance. 4. Depression. 5. Poor dentition.  DISCHARGE MEDICATIONS: 1. Zoloft 25 mg p.o. q.a.m. 2. Inderal 40 mg p.o. t.i.d. 3. PTU 100 mg p.o. t.i.d. 4. 70/30 insulin 25 units q.a.m. and 15 units q.p.m. (home health nursing to assist    with medications).  HISTORY OF PRESENT ILLNESS:  Ms. Degroote is a 35 year old African-American female  with a history of diabetes mellitus type 1 with DKA and hyperthyroidism who presents with nausea, vomiting and abdominal pain along with "flesh feeling." he went to the [facility] emergency room on the evening prior to admission, with a  reported history and a CBG less than 60.  She reports she was observed for six hours in the emergency department and was then sent home.  On the morning of admission, she had presented to the dental clinic for an appointment with Dr. Kristin Bruins.  After Dr. Chucky May evaluation, he referred her to our outpatient clinic, where she was there found to be in diabetic ketoacidosis and admitted to the medical teaching service.  The patient did report polydipsia, polyuria and denied any associated dysuria.  ADMISSION PHYSICAL EXAMINATION:  GENERAL:  A thin, ill-appearing African-American female with a fruity-smelling breath.  VITAL SIGNS:  Temperature 98.1, pulse 148, respirations 24 and blood pressure 97/41.  HEENT:  Marked proptosis, extraocular movements intact, pupils equal, round and  reactive to  light and oropharynx with thickened secretions; no erythema or exudate.  NECK:  Supple, without mass.  Positive for mild thyromegaly/tenderness.  BACK:  Negative for spinal or CVA tenderness.  CARDIOVASCULAR:  Tachycardic without any evidence of murmurs, rubs or gallops.  RESPIRATORY:  Clear to auscultation bilaterally with no respiratory distress.  ABDOMEN:  Soft, positive bowel sounds; mild diffuse tenderness.  EXTREMITIES:  Trace bilateral edema.  She had full range of motion.  NEUROLOGIC:  She had increased DTRs bilaterally.  Cranial nerves II-XII are intact. She was grossly nonfocal.  LABORATORY DATA SUMMARY:  Admission BMP - sodium 126, potassium 6.2, chloride 94, CO2 was 14, BUN 15, creatinine 1.2, serum glucose 846, calcium 9.7, Total protein 7.4, albumin 3.5, AST 33, ALT 29, alk phos 138 and Total bilirubin 2.4.  Repeat BMP - sodium 134, potassium 4.3, chloride 106, CO2 was 21, BUN 7, creatinine 0.4, glucose 245, calcium 8.6.  TSH less than 0.019.  Free T4 was 2.31.  T3 uptake 47.9.  HOSPITAL COURSE:  By problem: 1. Diabetic ketoacidosis. The patient was admitted to the surgical ICU where she was treated with fluid resuscitation and an insulin drip.  The patients diabetic ketoacidosis resolved  after implementing the aforementioned therapy.  The patient quickly resolved, as noted by her second BMP.  Once  the patients anion gap and CO2 had corrected, she was restarted on her home insulin 70/30 schedule.  The patient remained stable during her hospital course and was transferred to a regular bed.  As noted from her 05/25/99 admission, the patient had been treated for the same  problems, including her diabetic ketoacidosis.  Given the frequency of her admission, we felt that she may need a psychiatric evaluation to evaluate her mental capacity and competence in regard to her self-care.  2. Hyperthyroidism. The patient was continued on her PTU and Inderal, as  noted above.  The patient remained stable throughout her hospital course with no changes in her medications made.  The patient is to follow-up with Dr. Herminio Heads in the outpatient clinic for continued management of her hyperthyroidism.  3. Poor dentition. The patient had presented to Dr. Kristin Bruins on the day of admission but was unable to undergo dental care, given her acute illness.  The patient will follow-up with r. Kulinski on an outpatient basis.  4. Psychosocial. Dr. Jeanie Sewer with psychiatry evaluated Ms. Mika at our request.  He deemed Ms. Aung incapable of taking care of herself and felt that she would need facilitated care.  Dr. Jeanie Sewer also felt that Ms. Licht was suffering from depression and  began her on Zoloft.  DISPOSITION:   The patient underwent continued diabetic teaching during her hospital course.  The patient was counseled and educated by both the diabetes coordinator as well as myself.  Given the assessment by Dr. Jeanie Sewer and our own evaluation, we felt that Ms. Weatherman would benefit from home health care.  Ms. Axelson had home health care established thanks to Social Work.  FOLLOW-UP: 1. Patient is to follow-up with [facility] on 06/27/99 at 9 .m. 2. Patient also to [facility] with [facility] on 06/28/99 at 8 .m. 3. Patient to follow-up with Dr. Herminio Heads on 06/20/99 at 4 p.m. 4. Patient was instructed to call Dr. Kristin Bruins to reschedule that appointment in    the dental medicine clinic. DD:  07/04/99 TD:  07/04/99 Job: [id number]

## 2010-08-30 NOTE — Consult Note (Signed)
[place]. [facility]  Patient:    Zoe Clay, Zoe Clay                       MRN: [id number] Proc. Date: 05/29/99 Adm. Date:  [id number] Disc. Date: [id number] Attending:  Phifer, Harriett Sine [place] CC:         Remer Macho, M.D.             Alvester Morin, M.D.                          Consultation Report  DATE OF BIRTH:  1975/10/16  REFERRING PHYSICIANS:  Remer Macho, M.D. and Alvester Morin, M.D.  HISTORY OF PRESENT ILLNESS:  Zoe Clay is a 35 year old black female referred by Remer Macho, M.D. and Alvester Morin, M.D. for a dental consultation. The  patient was admitted on May 28, 1999 with a history of heart palpitation, shortness of breath, and fatigue. Myocardial infarction was ruled out and subsequently diagnosed with hyperthyroidism and diabetic ketoacidosis. Dental consultation was requested for the evaluation of a lower right toothache.  PAST MEDICAL HISTORY: 1. Diabetes mellitus type 1.    a. Recently diagnosed at [facility] two weeks ago.    b. Diagnosis of diabetic ketoacidosis this admission, corrected this admission. 2. Hyperthyroidism, currently on propylthiouracil (PTU). 3. History of heart arrhythmia. Sinus tachycardia per EKG. 4. Status post bilateral tubal ligation in February of 2000. 5. History of anemia.  ALLERGIES/ADVERSE DRUG REACTIONS:  None known.  CURRENT MEDICATIONS: (Per MAR). 1. Potassium chloride 40 mEq x 1 dose. 2. Insulin 70/30 20 units every morning, 10 units every evening. 3. Enteric-coated aspirin 325 mg daily. 4. Altace 2.5 mg daily. 5. PTU 100 mg three times daily. 6. Inderal 60 mg three times daily. 7. Darvocet-N 100 every four hours as needed for pain. 8. Tylenol 650 mg every four to six hours as needed.  SOCIAL HISTORY:  The patient is single and lives alone. The patient has one 73-year-old child. The patient is a nonsmoker - nondrinker by report.  FAMILY HISTORY:  Father has a  history of diabetes mellitus and thyroid problems.  FUNCTIONAL ASSESSMENT:  The patient was independent for all ADLs prior to this admission.  REVIEW OF SYSTEMS:  (Reviewed from history and physical/chart - this admission nd is included in the dental consultation record.)  DENTAL HISTORY:  CHIEF COMPLAINT:  The patient complains of "lower right toothache for the last three days."  HISTORY OF PRESENT ILLNESS:  The patient gives a history of sharp, spontaneous ain from the lower right tooth (points to tooth #30) for the past three days. The patient also complains of right facial swelling at this time. Pain medication "helps a little bit" but it "aint strong enough".  The patient last saw a private dentist "a long time ago". The patient indicates  that she did have her teeth last cleaned in 1998 by a private dentist. The patient does not seek regular dental care.  LABORATORY DATA:  On May 28, 1999, the white cell count was elevated at 13.1, hemoglobin depressed at 10.8, hematocrit depressed at 33.3, and 372,000 platelets.  DENTAL EXAMINATION:  GENERAL:  The patient is a well-developed, slightly-built black female laying in the hospital bed in obvious acute pain.  VITAL SIGNS:  Blood pressure is 114/60, pulse is 98, respirations are 19, temperature is 98.2.  HEAD/NECK:  The patient has right facial swelling  and lymphadenopathy with tenderness to palpitation by her report. There are no acute TMJ symptoms or trismus by patient report.  INTRAORAL:  The patient has right buccal swelling associated with tooth #30. The patient allowed a minimal exam due to patient discomfort.  PERIODONTAL:  The patient has chronic periodontal disease with evidence of plaque and calculus accumulation/accretions, gingival recession, tooth mobility, and bone loss. I would need a further periodontal charting and full series of radiographs to evaluate more fully.  DENTITION:  The  patient has tooth #1 and 16 present as root segments. All other  teeth are grossly present and intact.  DENTAL CARIES:  The patient has multiple dental caries which are noted. I would  need a full series of radiographs and a more thorough exam to finalize treatment plan and treatment options.  ENDODONTIC:  There is a history of acute, spontaneous pain associated with tooth #30 for the past three days. There are multiple periapical radiolucencies associated with the apices of tooth #1, 16, 19, and 30.  CROWN OR BRIDGE/PROSTHODONTIC:  There is no history of crown or bridge restorations or prosthodontic restorations.  OCCLUSION:  The occlusion appears to be stable at this time.  RADIOGRAPHIC INTERPRETATION:  (Panoramic x-ray was taken was on May 29, 1999 - I would need a full series of radiographs to allow a fabrication of a comprehensive treatment plan in the future.)  Root segments #1 and 16 are noted. Incipient horizontal and vertical bone loss s noted. There is radiographic calculus which is noted. There are multiple periapical radiolucencies associated with the apices of tooth #1, 16, 19, and 30. There are multiple dental caries which are noted. There is one amalgam restoration on tooth #14 noted.  ASSESSMENT: 1. Acute, irreversible pulpitis of tooth #30 with right facial    swelling/lymphadenopathy . 2. Periapical radiolucency associated with tooth #1, 16, 19, and 30. 3. Multiple dental caries. 4. Chronic periodontal disease with incipient bone loss. 5. Gingival recession. 6. Accretions. 7. Multiple root segments #1 and 16.  PLAN/RECOMMENDATIONS: 1. Discuss the findings with Remer Macho, M.D. and Alvester Morin, M.D. as    indicated. Suggest initiation of antibiotic therapy to cover oral microorganisms    as follows: Unasyn IV, amoxicillin/Augmentin by mouth. Will follow up with    extraction of the offending teeth/tooth after resolution of the  facial swelling.     This procedure can be done as an inpatient or outpatient if the patient is to be    discharged soon. 2. Discussion of treatment options with the patient including risks, benefits, nd    complications. The patient initially just wanted to have the "tooth pulled", but    I will present root canal therapy versus extraction therapy as well as the need    for follow-up evaluation for comprehensive dental care to include a full series    of radiographs, periodontal therapy, dental restorations, crown or bridge    therapy, root canal therapy as indicated. 3. Suggest initiation of chlorhexidine rinse 0.12% - rinsing with 15 ml three times    daily at this time. DD:  05/31/99 TD:  05/31/99 Job: [id number]

## 2010-10-02 ENCOUNTER — Emergency Department (HOSPITAL_BASED_OUTPATIENT_CLINIC_OR_DEPARTMENT_OTHER)
Admission: EM | Admit: 2010-10-02 | Discharge: 2010-10-02 | Disposition: A | Discharge status: Home / Self Care | Payer: Self-pay | Attending: Emergency Medicine | Admitting: Emergency Medicine

## 2010-10-02 DIAGNOSIS — G40909 Epilepsy, unspecified, not intractable, without status epilepticus: Secondary | ICD-10-CM | POA: Insufficient documentation

## 2010-10-02 DIAGNOSIS — E119 Type 2 diabetes mellitus without complications: Secondary | ICD-10-CM | POA: Insufficient documentation

## 2010-10-02 DIAGNOSIS — N751 Abscess of Bartholin's gland: Secondary | ICD-10-CM | POA: Insufficient documentation

## 2010-11-10 ENCOUNTER — Emergency Department (HOSPITAL_BASED_OUTPATIENT_CLINIC_OR_DEPARTMENT_OTHER)
Admission: EM | Admit: 2010-11-10 | Discharge: 2010-11-10 | Disposition: A | Discharge status: Home / Self Care | Payer: Self-pay | Attending: Emergency Medicine | Admitting: Emergency Medicine

## 2010-11-10 ENCOUNTER — Encounter: Payer: Self-pay | Admitting: Emergency Medicine

## 2010-11-10 DIAGNOSIS — Z202 Contact with and (suspected) exposure to infections with a predominantly sexual mode of transmission: Secondary | ICD-10-CM | POA: Insufficient documentation

## 2010-11-10 DIAGNOSIS — E119 Type 2 diabetes mellitus without complications: Secondary | ICD-10-CM | POA: Insufficient documentation

## 2010-11-10 DIAGNOSIS — K029 Dental caries, unspecified: Secondary | ICD-10-CM | POA: Insufficient documentation

## 2010-11-10 DIAGNOSIS — A599 Trichomoniasis, unspecified: Secondary | ICD-10-CM | POA: Insufficient documentation

## 2010-11-10 DIAGNOSIS — K1370 Unspecified lesions of oral mucosa: Secondary | ICD-10-CM | POA: Insufficient documentation

## 2010-11-10 DIAGNOSIS — Z79899 Other long term (current) drug therapy: Secondary | ICD-10-CM | POA: Insufficient documentation

## 2010-11-10 DIAGNOSIS — E079 Disorder of thyroid, unspecified: Secondary | ICD-10-CM | POA: Insufficient documentation

## 2010-11-10 HISTORY — DX: Disorder of thyroid, unspecified: E07.9

## 2010-11-10 HISTORY — DX: Unspecified convulsions: R56.9

## 2010-11-10 LAB — URINALYSIS, ROUTINE W REFLEX MICROSCOPIC
Glucose, UA: >1000 mg/dL — AB
Ketones, ur: 15 mg/dL — AB
pH: 5.5 (ref 5.0–8.0)

## 2010-11-10 LAB — WET PREP, GENITAL

## 2010-11-10 LAB — URINE MICROSCOPIC-ADD ON

## 2010-11-10 MED ORDER — AZITHROMYCIN 250 MG PO TABS
1000.0000 mg | ORAL_TABLET | Freq: Once | ORAL | Status: AC
  Administered 2010-11-10: 1000 mg via ORAL

## 2010-11-10 MED ORDER — AZITHROMYCIN 250 MG PO TABS
ORAL_TABLET | ORAL | Status: AC
  Administered 2010-11-10: 1000 mg via ORAL
  Filled 2010-11-10: qty 4

## 2010-11-10 MED ORDER — AZITHROMYCIN 1 G PO PACK
1.0000 g | PACK | Freq: Once | ORAL | Status: DC
  Filled 2010-11-10: qty 1

## 2010-11-10 MED ORDER — LIDOCAINE HCL (PF) 1 % IJ SOLN
INTRAMUSCULAR | Status: AC
  Administered 2010-11-10: 2 mL
  Filled 2010-11-10: qty 5

## 2010-11-10 MED ORDER — CEFTRIAXONE SODIUM 250 MG IJ SOLR
250.0000 mg | Freq: Once | INTRAMUSCULAR | Status: AC
  Administered 2010-11-10: 250 mg via INTRAMUSCULAR
  Filled 2010-11-10: qty 250

## 2010-11-10 MED ORDER — METRONIDAZOLE 500 MG PO TABS
2000.0000 mg | ORAL_TABLET | Freq: Once | ORAL | Status: AC
  Administered 2010-11-10: 2000 mg via ORAL
  Filled 2010-11-10: qty 4

## 2010-11-10 NOTE — ED Provider Notes (Signed)
History     Chief Complaint  Patient presents with  . Vaginal Discharge  . Oral Swelling   Patient is a 35 y.o. female presenting with vaginal discharge and tooth pain. The history is provided by the patient. No language interpreter was used.  Vaginal Discharge This is a new problem. The current episode started in the past 7 days. The problem occurs constantly. The problem has been unchanged. Pertinent negatives include no abdominal pain, change in bowel habit, fever, headaches, neck pain, rash, sore throat, vomiting or weakness. The symptoms are aggravated by nothing. She has tried nothing for the symptoms.  Dental PainThe primary symptoms include mouth pain. Primary symptoms do not include headaches, fever or sore throat. The symptoms began yesterday. The symptoms are unchanged. The symptoms are new. The symptoms occur constantly.  Additional symptoms do not include: trouble swallowing. Medical issues do not include: alcohol problem.    Past Medical History  Diagnosis Date  . Diabetes mellitus   . Seizures   . Thyroid disease     Past Surgical History  Procedure Date  . Tubal ligation     History reviewed. No pertinent family history.  History  Substance Use Topics  . Smoking status: Current Everyday Smoker  . Smokeless tobacco: Not on file  . Alcohol Use: No    OB History    Grav Para Term Preterm Abortions TAB SAB Ect Mult Living                  Review of Systems  Constitutional: Negative for fever.  HENT: Negative for sore throat, trouble swallowing and neck pain.        Pt c/o left lower dental pain  Respiratory: Negative.   Gastrointestinal: Negative for vomiting, abdominal pain and change in bowel habit.  Genitourinary: Positive for vaginal discharge.  Skin: Negative for rash.  Neurological: Negative for weakness and headaches.  All other systems reviewed and are negative.    Physical Exam  LMP 11/07/2010  Physical Exam  Constitutional: She is  oriented to person, place, and time. She appears well-developed and well-nourished.  HENT:  Head: Normocephalic and atraumatic.  Mouth/Throat:    Eyes: Pupils are equal, round, and reactive to light.  Cardiovascular: Normal rate and regular rhythm.   Pulmonary/Chest: Effort normal and breath sounds normal.  Abdominal: Soft. There is no tenderness.  Genitourinary: Right adnexum displays no tenderness. Left adnexum displays no tenderness. Vaginal discharge found.       Pt has green vaginal discharge  Musculoskeletal: Normal range of motion.  Neurological: She is alert and oriented to person, place, and time.  Skin: Skin is warm and dry.  Psychiatric: She has a normal mood and affect.    ED Course  Procedures  MDM Pt treated for std here based on green discharge:educated pt:no need for any medication for the gums at this time      Teressa Lower, NP 11/10/10 1317

## 2010-11-10 NOTE — ED Notes (Signed)
Pt states she is having clear to white discharge x 3 days.  No odor.  Some itching.  No pain.   Also c/o gum pain on left lower gum area.  Denies fever.

## 2010-11-10 NOTE — ED Provider Notes (Signed)
Medical screening examination/treatment/procedure(s) were performed by non-physician practitioner and as supervising physician I was immediately available for consultation/collaboration.   Rolan Bucco, MD 11/10/10 1501

## 2010-11-11 LAB — GC/CHLAMYDIA PROBE AMP, GENITAL: Chlamydia, DNA Probe: NEGATIVE

## 2010-11-16 ENCOUNTER — Encounter (HOSPITAL_BASED_OUTPATIENT_CLINIC_OR_DEPARTMENT_OTHER): Payer: Self-pay | Admitting: Emergency Medicine

## 2010-11-16 ENCOUNTER — Emergency Department (HOSPITAL_BASED_OUTPATIENT_CLINIC_OR_DEPARTMENT_OTHER)
Admission: EM | Admit: 2010-11-16 | Discharge: 2010-11-16 | Disposition: A | Discharge status: Home / Self Care | Payer: Self-pay | Attending: Emergency Medicine | Admitting: Emergency Medicine

## 2010-11-16 DIAGNOSIS — F17200 Nicotine dependence, unspecified, uncomplicated: Secondary | ICD-10-CM | POA: Insufficient documentation

## 2010-11-16 DIAGNOSIS — E079 Disorder of thyroid, unspecified: Secondary | ICD-10-CM | POA: Insufficient documentation

## 2010-11-16 DIAGNOSIS — M7989 Other specified soft tissue disorders: Secondary | ICD-10-CM | POA: Insufficient documentation

## 2010-11-16 DIAGNOSIS — E1010 Type 1 diabetes mellitus with ketoacidosis without coma: Secondary | ICD-10-CM | POA: Insufficient documentation

## 2010-11-16 LAB — COMPREHENSIVE METABOLIC PANEL
ALT: 8 U/L (ref 0–35)
ALT: 14 U/L (ref 0–35)
AST: 19 U/L (ref 0–37)
AST: 19 U/L (ref 0–37)
Albumin: 3.8 g/dL (ref 3.5–5.2)
Alkaline Phosphatase: 147 U/L — ABNORMAL HIGH (ref 39–117)
CO2: 13 mEq/L — ABNORMAL LOW (ref 19–32)
CO2: 17 mEq/L — ABNORMAL LOW (ref 19–32)
Calcium: 9.6 mg/dL (ref 8.4–10.5)
Chloride: 87 mEq/L — ABNORMAL LOW (ref 96–112)
Creatinine, Ser: 0.8 mg/dL (ref 0.50–1.10)
GFR calc non Af Amer: >60 mL/min (ref >60)
GFR calc non Af Amer: >60 mL/min (ref >60)
Potassium: 4.3 mEq/L (ref 3.5–5.1)
Sodium: 126 mEq/L — ABNORMAL LOW (ref 135–145)
Sodium: 133 mEq/L — ABNORMAL LOW (ref 135–145)
Total Bilirubin: 0.3 mg/dL (ref 0.3–1.2)

## 2010-11-16 LAB — URINE MICROSCOPIC-ADD ON

## 2010-11-16 LAB — URINALYSIS, ROUTINE W REFLEX MICROSCOPIC
Bilirubin Urine: NEGATIVE
Hgb urine dipstick: NEGATIVE
Protein, ur: NEGATIVE mg/dL
Urobilinogen, UA: 0.2 mg/dL (ref 0.0–1.0)

## 2010-11-16 MED ORDER — SODIUM CHLORIDE 0.9 % IV BOLUS (SEPSIS)
1000.0000 mL | Freq: Once | INTRAVENOUS | Status: AC
  Administered 2010-11-16: 1000 mL via INTRAVENOUS

## 2010-11-16 MED ORDER — INSULIN REGULAR HUMAN 100 UNIT/ML IJ SOLN
10.0000 [IU] | Freq: Once | INTRAMUSCULAR | Status: AC
  Administered 2010-11-16: 10 [IU] via INTRAVENOUS
  Filled 2010-11-16: qty 3

## 2010-11-16 NOTE — ED Notes (Signed)
Pt report hx of diabetes and bilateral ankle surgery in 2009. Ankle  Swelling and calf pain and tightness x 24hrs Pulses intact

## 2010-11-16 NOTE — ED Notes (Signed)
CBG 152 Pt CO leg and calf pain 7/10 Cleatrice Burke RN notified
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
## 2010-11-16 NOTE — ED Notes (Signed)
Patient is resting comfortably. 

## 2010-11-16 NOTE — ED Provider Notes (Signed)
I advised pt on need for admission but she refused  Medical screening examination/treatment/procedure(s) were conducted as a shared visit with non-physician practitioner(s) and myself.  I personally evaluated the patient during the encounter  Joya Gaskins, MD 11/16/10 [phone]

## 2010-11-16 NOTE — ED Notes (Signed)
The patient's CBG is 304. It was check by the [employer] Glucose Meter.

## 2011-01-24 ENCOUNTER — Encounter (HOSPITAL_BASED_OUTPATIENT_CLINIC_OR_DEPARTMENT_OTHER): Payer: Self-pay | Admitting: *Deleted

## 2011-01-24 ENCOUNTER — Emergency Department (HOSPITAL_BASED_OUTPATIENT_CLINIC_OR_DEPARTMENT_OTHER)
Admission: EM | Admit: 2011-01-24 | Discharge: 2011-01-24 | Disposition: A | Discharge status: Home / Self Care | Payer: Self-pay | Attending: Emergency Medicine | Admitting: Emergency Medicine

## 2011-01-24 DIAGNOSIS — L0291 Cutaneous abscess, unspecified: Secondary | ICD-10-CM

## 2011-01-24 DIAGNOSIS — L02219 Cutaneous abscess of trunk, unspecified: Secondary | ICD-10-CM | POA: Insufficient documentation

## 2011-01-24 MED ORDER — HYDROCODONE-ACETAMINOPHEN 5-325 MG PO TABS
1.0000 | ORAL_TABLET | Freq: Once | ORAL | Status: AC
  Administered 2011-01-24: 1 via ORAL
  Filled 2011-01-24: qty 1

## 2011-01-24 MED ORDER — DOXYCYCLINE HYCLATE 100 MG PO TABS
100.0000 mg | ORAL_TABLET | Freq: Once | ORAL | Status: AC
  Administered 2011-01-24: 100 mg via ORAL
  Filled 2011-01-24: qty 1

## 2011-01-24 MED ORDER — DOXYCYCLINE HYCLATE 100 MG PO CAPS: 100.0000 mg | ORAL_CAPSULE | Freq: Two times a day (BID) | ORAL | Status: AC

## 2011-01-24 MED ORDER — LIDOCAINE HCL (PF) 1 % IJ SOLN
INTRAMUSCULAR | Status: AC
  Filled 2011-01-24: qty 5

## 2011-01-24 MED ORDER — LIDOCAINE-EPINEPHRINE 2 %-1:100000 IJ SOLN
20.0000 mL | Freq: Once | INTRAMUSCULAR | Status: AC
  Administered 2011-01-24: 20 mL via INTRADERMAL

## 2011-01-24 MED ORDER — LIDOCAINE-EPINEPHRINE 2 %-1:100000 IJ SOLN
INTRAMUSCULAR | Status: AC
  Administered 2011-01-24: 20 mL via INTRADERMAL
  Filled 2011-01-24: qty 1

## 2011-01-24 MED ORDER — HYDROCODONE-ACETAMINOPHEN 5-325 MG PO TABS: 1.0000 | ORAL_TABLET | Freq: Four times a day (QID) | ORAL | Status: AC | PRN

## 2011-01-24 NOTE — ED Provider Notes (Addendum)
History     CSN: [id number] Arrival date & time: 01/24/2011  7:52 PM  Chief Complaint  Patient presents with  . Abscess    (Consider location/radiation/quality/duration/timing/severity/associated sxs/prior treatment) HPI This is a 35 year old black female with a 3 day history of pain and swelling in the left pubic region. It is moderately painful and worse with palpation or movement. She denies systemic symptoms such as fever or malaise.  Past Medical History  Diagnosis Date  . Diabetes mellitus   . Seizures   . Thyroid disease     Past Surgical History  Procedure Date  . Tubal ligation   . Ankle surgery     No family history on file.  History  Substance Use Topics  . Smoking status: Current Everyday Smoker -- 0.5 packs/day  . Smokeless tobacco: Not on file  . Alcohol Use: No    OB History    Grav Para Term Preterm Abortions TAB SAB Ect Mult Living                  Review of Systems  All other systems reviewed and are negative.    Allergies  Review of patient's allergies indicates no known allergies.  Home Medications   Current Outpatient Rx  Name Route Sig Dispense Refill  . INSULIN ASPART 100 UNIT/ML [place] SOLN Subcutaneous Inject 10-12 Units into the skin 3 (three) times daily. 10 units with breakfast, lunch, and dinner when blood sugar is below 200. 12 units when blood sugar 200 or above.     . INSULIN GLARGINE 100 UNIT/ML [place] SOLN Subcutaneous Inject 45 Units into the skin at bedtime.     Marland Kitchen LEVOTHYROXINE SODIUM 125 MCG PO TABS Oral Take 125 mcg by mouth daily.      Marland Kitchen LISINOPRIL PO Oral Take 1 tablet by mouth daily.      Marland Kitchen PHENYTOIN SODIUM EXTENDED 100 MG PO CAPS Oral Take 200 mg by mouth 2 (two) times daily.     Marland Kitchen PRESCRIPTION MEDICATION Oral Take 1 tablet by mouth daily. Unknown cholesterol medication      . QUETIAPINE FUMARATE 25 MG PO TABS Oral Take 25 mg by mouth at bedtime.      . INSULIN ASPART 100 UNIT/ML [place] SOLN Subcutaneous Inject 10 Units into  the skin 3 (three) times daily before meals.      . INSULIN GLARGINE 100 UNIT/ML  SOLN Subcutaneous Inject 35 Units into the skin at bedtime.        BP 112/70  Pulse 112  Temp(Src) 98.2 F (36.8 C) (Oral)  Resp 16  SpO2 100%  LMP 01/14/2011  Physical Exam General: Well-developed, well-nourished female in no acute distress; appearance consistent with age of record HENT: normocephalic, atraumatic Eyes: Normal appearance Neck: supple Heart: regular rate and rhythm Lungs: clear to auscultation bilaterally Abdomen: soft; nondistended  Extremities: No deformity; full range of motion Neurologic: Awake, alert and oriented;motor function intact in all extremities and symmetric; no facial droop Skin: Warm and dry; abscess left pubic region Psychiatric: Normal mood and affect   ED Course  Procedures (including critical care time)     MDM  All elements of the H&P performed by myself. Langston Masker, PA-C, performed the I&D procedure following my examination the patient.        Hanley Seamen, MD 01/24/11 2155  Hanley Seamen, MD 01/24/11 2158

## 2011-01-24 NOTE — ED Notes (Signed)
Pt has an "infected hair" on her left lower abdomen x3 days.

## 2011-01-24 NOTE — ED Notes (Signed)
Patient groin area clipped to allow visual access to the abscess area.

## 2011-01-24 NOTE — ED Provider Notes (Signed)
History     CSN: [id number] Arrival date & time: 01/24/2011  7:52 PM  Chief Complaint  Patient presents with  . Abscess    (Consider location/radiation/quality/duration/timing/severity/associated sxs/prior treatment) HPI  Past Medical History  Diagnosis Date  . Diabetes mellitus   . Seizures   . Thyroid disease     Past Surgical History  Procedure Date  . Tubal ligation   . Ankle surgery     No family history on file.  History  Substance Use Topics  . Smoking status: Current Everyday Smoker -- 0.5 packs/day  . Smokeless tobacco: Not on file  . Alcohol Use: No    OB History    Grav Para Term Preterm Abortions TAB SAB Ect Mult Living                  Review of Systems  Allergies  Review of patient's allergies indicates no known allergies.  Home Medications   Current Outpatient Rx  Name Route Sig Dispense Refill  . INSULIN ASPART 100 UNIT/ML [place] SOLN Subcutaneous Inject 10-12 Units into the skin 3 (three) times daily. 10 units with breakfast, lunch, and dinner when blood sugar is below 200. 12 units when blood sugar 200 or above.     . INSULIN GLARGINE 100 UNIT/ML [place] SOLN Subcutaneous Inject 45 Units into the skin at bedtime.     Marland Kitchen LEVOTHYROXINE SODIUM 125 MCG PO TABS Oral Take 125 mcg by mouth daily.      Marland Kitchen LISINOPRIL PO Oral Take 1 tablet by mouth daily.      Marland Kitchen PHENYTOIN SODIUM EXTENDED 100 MG PO CAPS Oral Take 200 mg by mouth 2 (two) times daily.     Marland Kitchen PRESCRIPTION MEDICATION Oral Take 1 tablet by mouth daily. Unknown cholesterol medication      . QUETIAPINE FUMARATE 25 MG PO TABS Oral Take 25 mg by mouth at bedtime.      . INSULIN ASPART 100 UNIT/ML [place] SOLN Subcutaneous Inject 10 Units into the skin 3 (three) times daily before meals.      . INSULIN GLARGINE 100 UNIT/ML  SOLN Subcutaneous Inject 35 Units into the skin at bedtime.        BP 112/70  Pulse 112  Temp(Src) 98.2 F (36.8 C) (Oral)  Resp 16  SpO2 100%  LMP 01/14/2011  Physical  Exam  ED Course  INCISION AND DRAINAGE Date/Time: 01/24/2011 9:55 PM Performed by: Langston Masker Authorized by: Langston Masker Consent: Verbal consent obtained. Risks and benefits: risks, benefits and alternatives were discussed Consent given by: patient Patient understanding: patient states understanding of the procedure being performed Patient identity confirmed: verbally with patient Time out: Immediately prior to procedure a "time out" was called to verify the correct patient, procedure, equipment, support staff and site/side marked as required. Type: abscess Anesthesia: local infiltration Patient sedated: no Scalpel size: 11 Incision type: single straight Complexity: simple Drainage: purulent Drainage amount: copious Wound treatment: wound left open Packing material: 1/4 in iodoform gauze Patient tolerance: Patient tolerated the procedure well with no immediate complications.   (including critical care time)  Labs Reviewed - No data to display No results found.   No diagnosis found.    MDM  Pt advised to return in 2 days for packing removal        Langston Masker, [place] 01/24/11 2156

## 2011-01-24 NOTE — ED Notes (Signed)
States she has an infected hair follicle in her groin area. Hx of same with I&D in the past.

## 2011-02-17 ENCOUNTER — Emergency Department (INDEPENDENT_AMBULATORY_CARE_PROVIDER_SITE_OTHER): Payer: Self-pay

## 2011-02-17 ENCOUNTER — Emergency Department (HOSPITAL_BASED_OUTPATIENT_CLINIC_OR_DEPARTMENT_OTHER)
Admission: EM | Admit: 2011-02-17 | Discharge: 2011-02-17 | Disposition: A | Discharge status: Home / Self Care | Payer: Self-pay | Attending: Emergency Medicine | Admitting: Emergency Medicine

## 2011-02-17 ENCOUNTER — Encounter (HOSPITAL_BASED_OUTPATIENT_CLINIC_OR_DEPARTMENT_OTHER): Payer: Self-pay | Admitting: Family Medicine

## 2011-02-17 DIAGNOSIS — M542 Cervicalgia: Secondary | ICD-10-CM

## 2011-02-17 DIAGNOSIS — F319 Bipolar disorder, unspecified: Secondary | ICD-10-CM | POA: Insufficient documentation

## 2011-02-17 DIAGNOSIS — X503XXA Overexertion from repetitive movements, initial encounter: Secondary | ICD-10-CM | POA: Insufficient documentation

## 2011-02-17 DIAGNOSIS — M545 Low back pain: Secondary | ICD-10-CM

## 2011-02-17 DIAGNOSIS — E079 Disorder of thyroid, unspecified: Secondary | ICD-10-CM | POA: Insufficient documentation

## 2011-02-17 DIAGNOSIS — Z79899 Other long term (current) drug therapy: Secondary | ICD-10-CM | POA: Insufficient documentation

## 2011-02-17 DIAGNOSIS — S335XXA Sprain of ligaments of lumbar spine, initial encounter: Secondary | ICD-10-CM | POA: Insufficient documentation

## 2011-02-17 DIAGNOSIS — T148XXA Other injury of unspecified body region, initial encounter: Secondary | ICD-10-CM

## 2011-02-17 DIAGNOSIS — E7800 Pure hypercholesterolemia, unspecified: Secondary | ICD-10-CM | POA: Insufficient documentation

## 2011-02-17 DIAGNOSIS — E119 Type 2 diabetes mellitus without complications: Secondary | ICD-10-CM | POA: Insufficient documentation

## 2011-02-17 DIAGNOSIS — F17200 Nicotine dependence, unspecified, uncomplicated: Secondary | ICD-10-CM | POA: Insufficient documentation

## 2011-02-17 HISTORY — DX: Essential (primary) hypertension: I10

## 2011-02-17 HISTORY — DX: Bipolar disorder, unspecified: F31.9

## 2011-02-17 HISTORY — DX: Pure hypercholesterolemia, unspecified: E78.00

## 2011-02-17 IMAGING — CR DG CERVICAL SPINE COMPLETE 4+V
6 series · 6 of 6 positions shown · non-contrast
Comparison: None

CLINICAL DATA: Neck stiffness, onset of pain at work

CERVICAL SPINE - COMPLETE 4+ VIEW

[w c-spine lat]
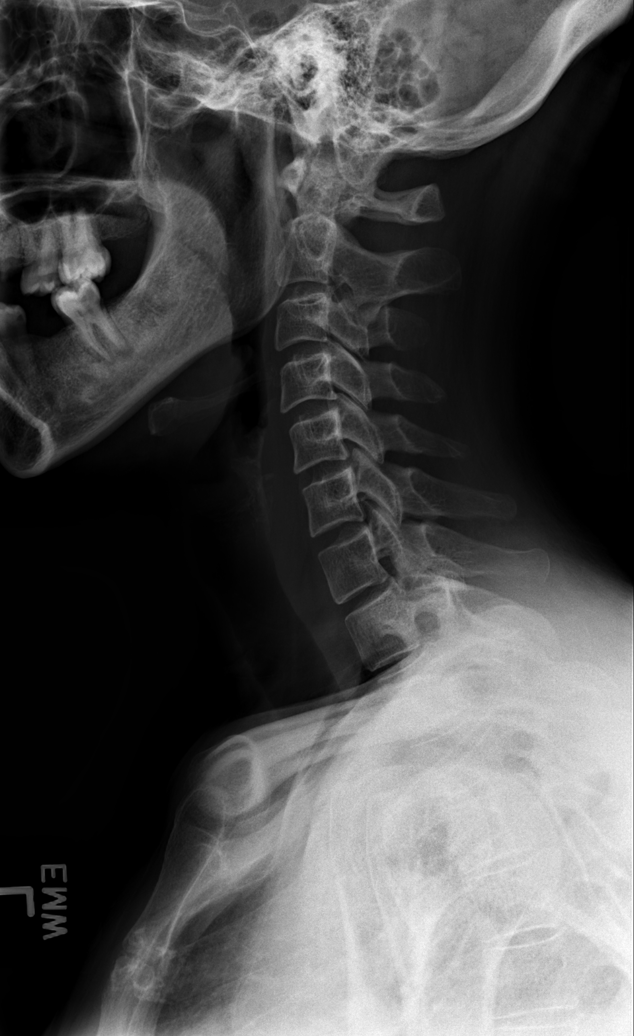

[w c-spine oblique (1 of 2)]
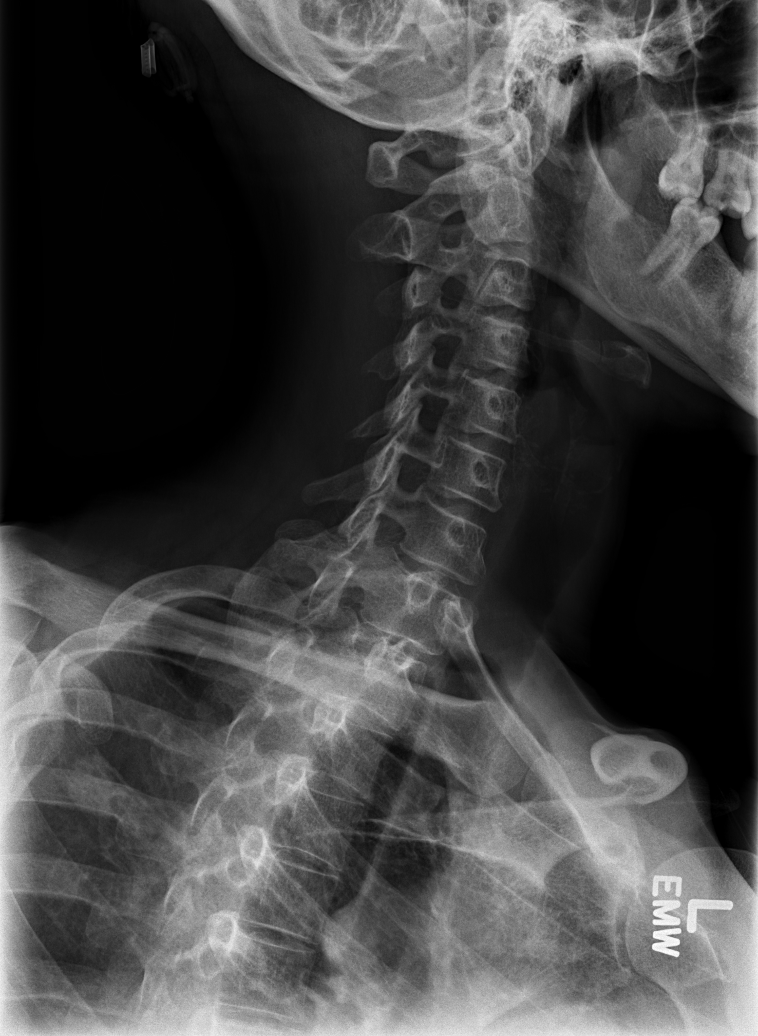

[w c-spine oblique (2 of 2)]
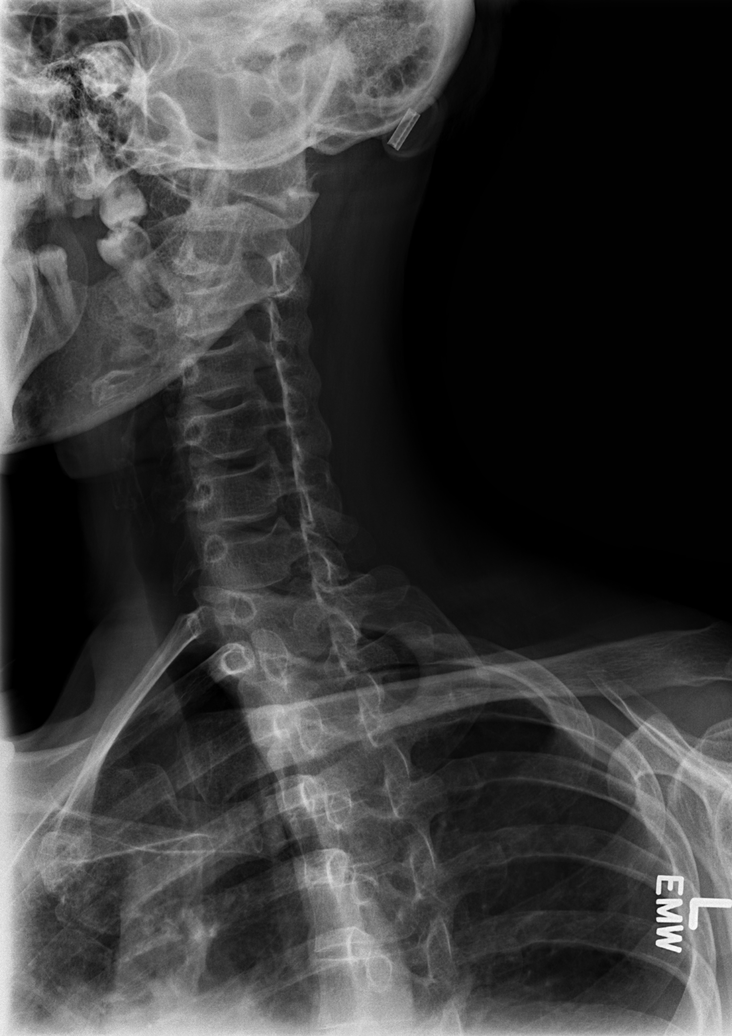

[w c-spine a.p.]
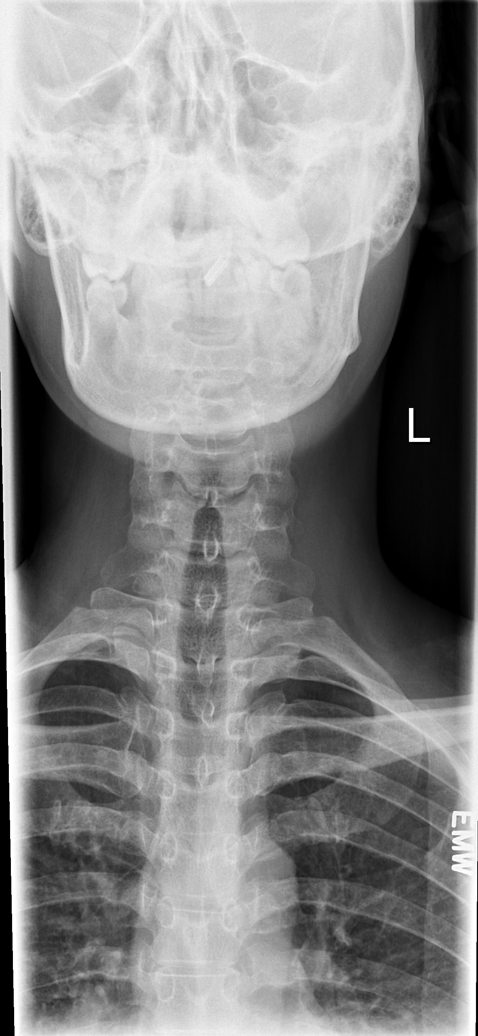

[w c-spine odontoid (1 of 2)]
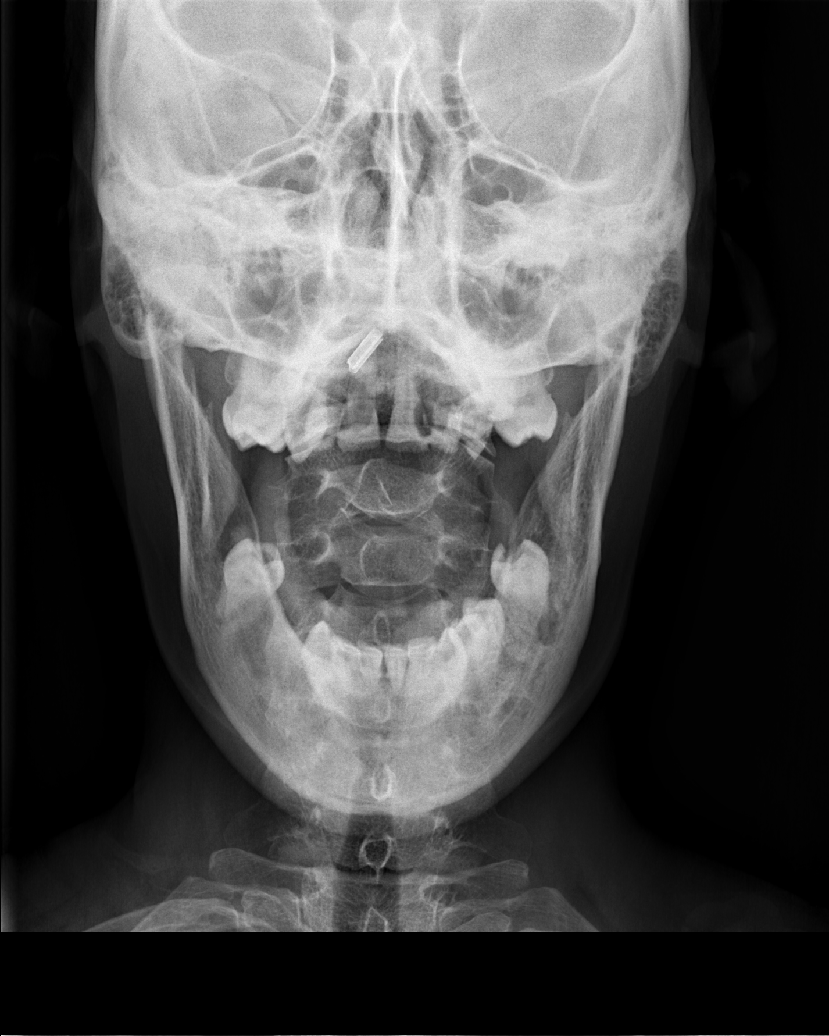

[w c-spine odontoid (2 of 2)]
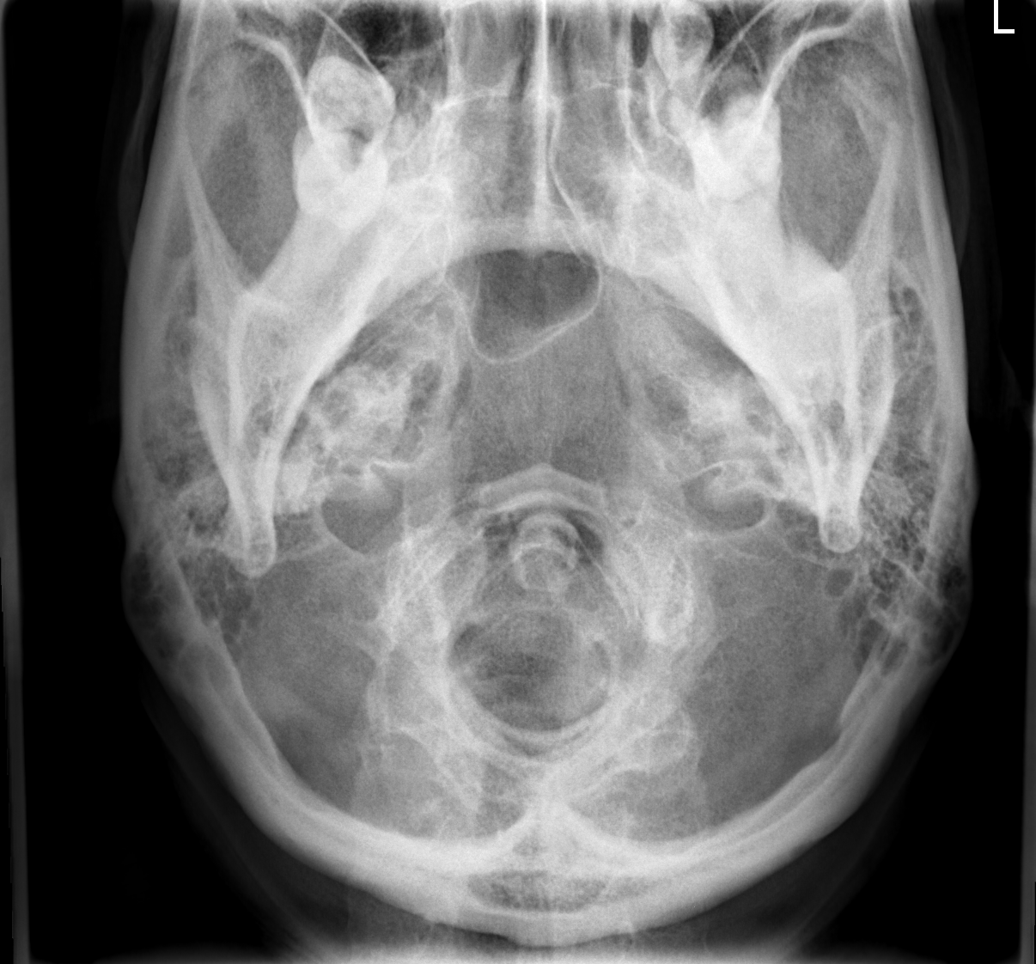

[6 of 6 positions shown; findings below may reference images not displayed]

FINDINGS: Question osseous demineralization versus artifact from
overpenetration.
Vertebral body and disc space heights maintained.
Prevertebral soft tissues normal thickness.
Bony foramina patent.
No acute fracture, subluxation or bone destruction.
Questionable sclerotic focus anterolateral right second rib versus
summation artifact.
IMPRESSION: No acute cervical spine abnormalities.
Questionable nonspecific sclerotic focus at right second rib, of
uncertain significance.

## 2011-02-17 IMAGING — CR DG LUMBAR SPINE COMPLETE 4+V
6 series · 6 of 6 positions shown · non-contrast
Comparison: None

CLINICAL DATA: Low back pain, onset at work

LUMBAR SPINE - COMPLETE 4+ VIEW

[t l-spine a.p.]
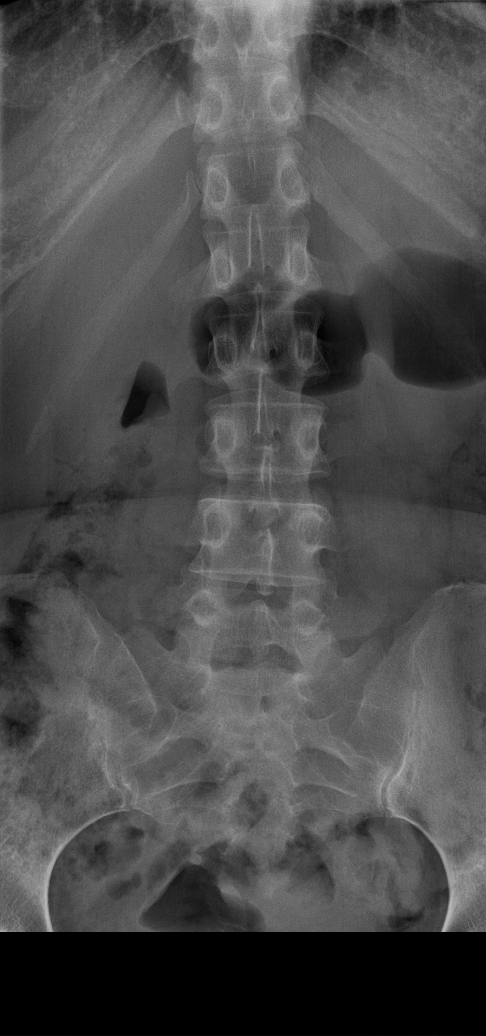

[t l-spine oblique exposure (1 of 3)]
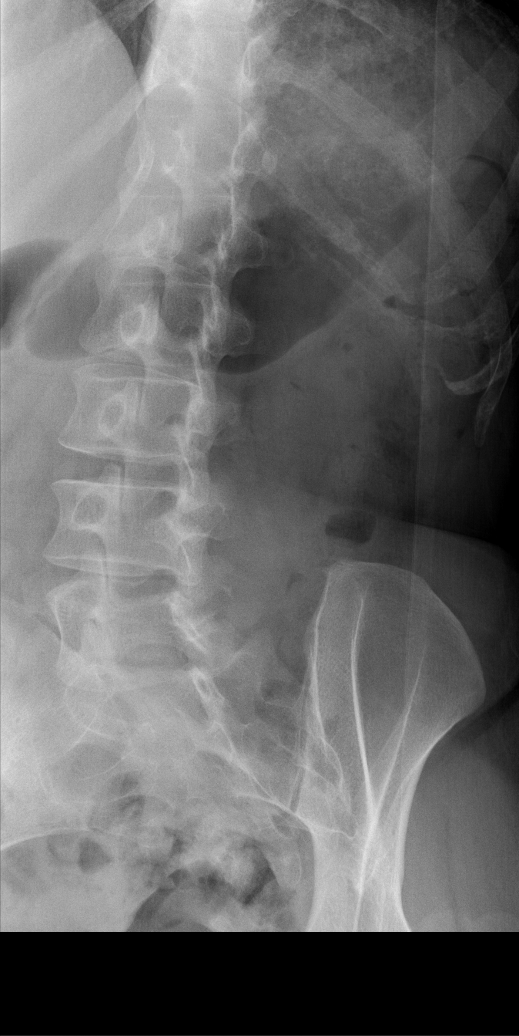

[t l-spine oblique exposure (2 of 3)]
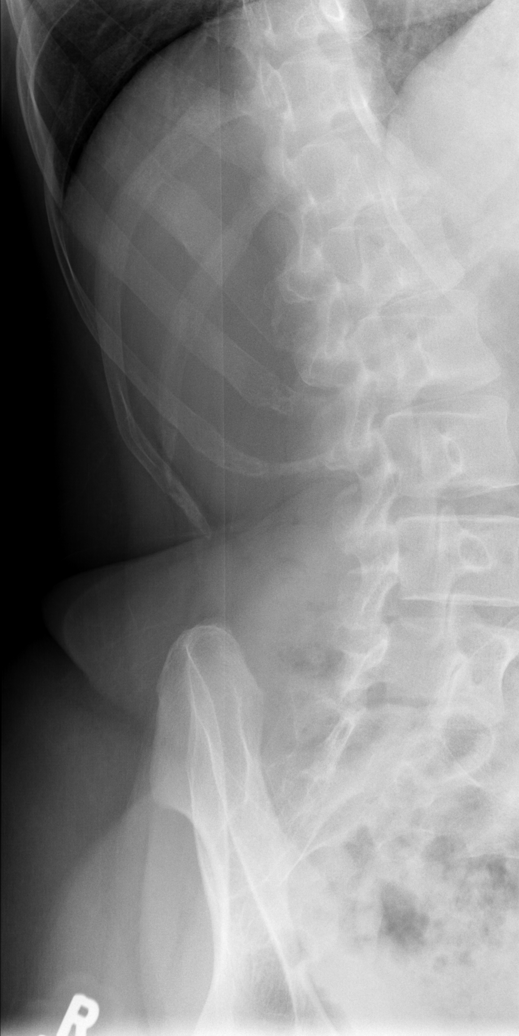

[t l-spine oblique exposure (3 of 3)]
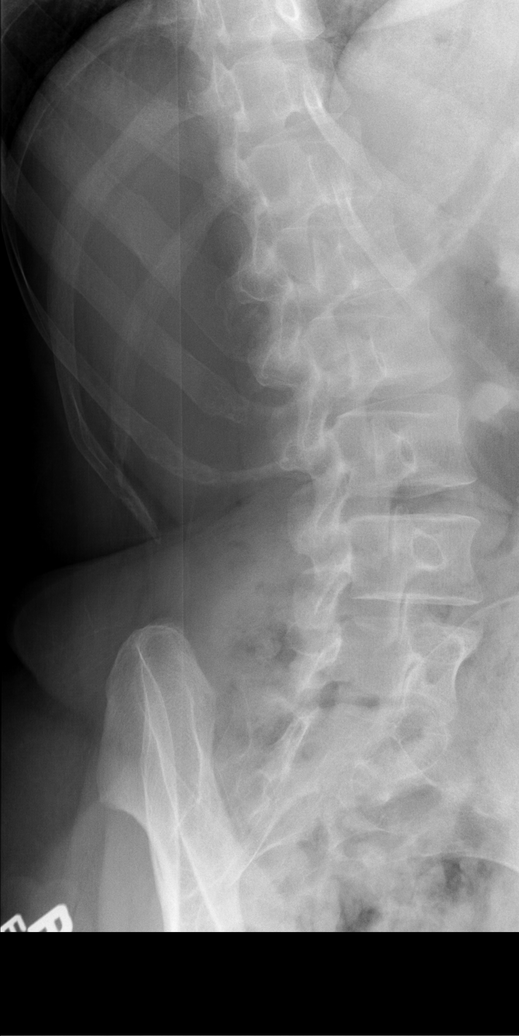

[t l-spine lat]
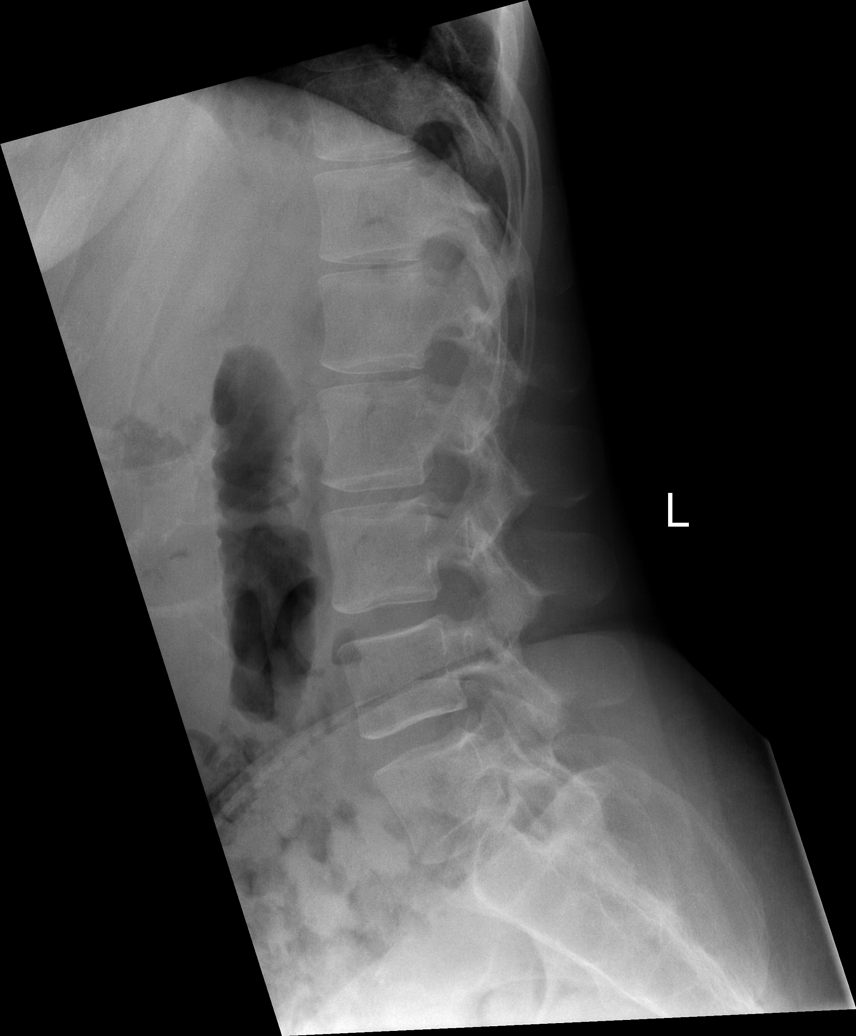

[t l-spine l5-s1 spot]
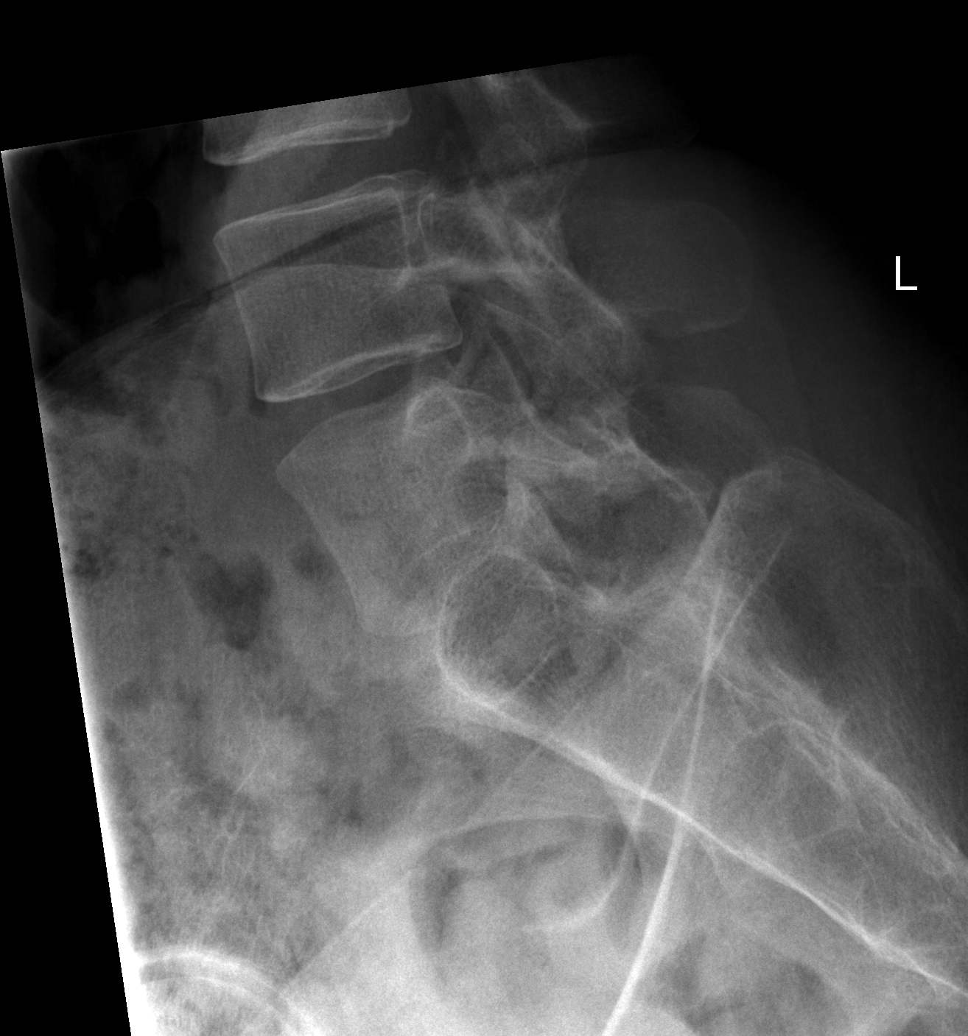

[6 of 6 positions shown; findings below may reference images not displayed]

FINDINGS: Five non-rib bearing lumbar vertebrae.
Bones appear demineralized.
Mild broad-based levoconvex scoliosis questioned versus positional
finding.
Vertebral body and disc space heights maintained.
No acute fracture, subluxation or bone destruction.
No spondylolysis.
SI joints grossly unremarkable.
IMPRESSION: Question mild levoconvex scoliosis.
No acute bony abnormalities.

## 2011-02-17 MED ORDER — CYCLOBENZAPRINE HCL 10 MG PO TABS: 10.0000 mg | ORAL_TABLET | Freq: Two times a day (BID) | ORAL | Status: AC | PRN

## 2011-02-17 MED ORDER — IBUPROFEN 800 MG PO TABS
800.0000 mg | ORAL_TABLET | Freq: Once | ORAL | Status: AC
  Administered 2011-02-17: 800 mg via ORAL
  Filled 2011-02-17: qty 1

## 2011-02-17 NOTE — ED Provider Notes (Signed)
History     CSN: [id number] Arrival date & time: 02/17/2011  9:10 AM   First MD Initiated Contact with Patient 02/17/11 [phone]      Chief Complaint  Patient presents with  . Back Pain    (Consider location/radiation/quality/duration/timing/severity/associated sxs/prior treatment) Patient is a 35 y.o. female presenting with back pain. The history is provided by the patient.  Back Pain  This is a new problem. The current episode started 2 days ago. The problem occurs constantly. The problem has not changed since onset.The pain is associated with lifting heavy objects. The pain is present in the lumbar spine (c-spine). The quality of the pain is described as stabbing and aching. The pain does not radiate. The pain is at a severity of 7/10. The pain is moderate. The symptoms are aggravated by certain positions and bending. The pain is the same all the time. Stiffness is present all day. Pertinent negatives include no fever, no headaches, no bowel incontinence, no bladder incontinence, no paresthesias, no paresis, no tingling and no weakness. She has tried analgesics for the symptoms. The treatment provided no relief.    Past Medical History  Diagnosis Date  . Diabetes mellitus   . Seizures   . Thyroid disease   . Hypertension   . High cholesterol   . Bipolar 1 disorder     Past Surgical History  Procedure Date  . Tubal ligation   . Ankle surgery     No family history on file.  History  Substance Use Topics  . Smoking status: Current Everyday Smoker -- 0.5 packs/day  . Smokeless tobacco: Not on file  . Alcohol Use: No    OB History    Grav Para Term Preterm Abortions TAB SAB Ect Mult Living                  Review of Systems  Constitutional: Negative for fever.  Gastrointestinal: Negative for bowel incontinence.  Genitourinary: Negative for bladder incontinence.  Musculoskeletal: Positive for back pain.  Neurological: Negative for tingling, weakness, headaches and  paresthesias.    Allergies  Review of patient's allergies indicates no known allergies.  Home Medications   Current Outpatient Rx  Name Route Sig Dispense Refill  . SIMVASTATIN PO Oral Take by mouth.      . AMBIEN PO Oral Take by mouth.      . INSULIN ASPART 100 UNIT/ML [place] SOLN Subcutaneous Inject 10-12 Units into the skin 3 (three) times daily. 10 units with breakfast, lunch, and dinner when blood sugar is below 200. 12 units when blood sugar 200 or above.     . INSULIN ASPART 100 UNIT/ML [place] SOLN Subcutaneous Inject 10 Units into the skin 3 (three) times daily before meals.      . INSULIN GLARGINE 100 UNIT/ML [place] SOLN Subcutaneous Inject 45 Units into the skin at bedtime.     . INSULIN GLARGINE 100 UNIT/ML [place] SOLN Subcutaneous Inject 35 Units into the skin at bedtime.      Marland Kitchen LEVOTHYROXINE SODIUM 125 MCG PO TABS Oral Take 125 mcg by mouth daily.      Marland Kitchen LISINOPRIL PO Oral Take 1 tablet by mouth daily.      Marland Kitchen PHENYTOIN SODIUM EXTENDED 100 MG PO CAPS Oral Take 200 mg by mouth 2 (two) times daily.     Marland Kitchen PRESCRIPTION MEDICATION Oral Take 1 tablet by mouth daily. Unknown cholesterol medication      . QUETIAPINE FUMARATE 25 MG PO TABS Oral Take 25  mg by mouth at bedtime.        BP 121/74  Pulse 99  Temp(Src) 97.4 F (36.3 C) (Oral)  Resp 20  SpO2 100%  LMP 02/17/2011  Physical Exam  Nursing note and vitals reviewed. Constitutional: She is oriented to person, place, and time. She appears well-developed and well-nourished. No distress.  HENT:  Head: Normocephalic and atraumatic.  Mouth/Throat: Oropharynx is clear and moist.  Eyes: EOM are normal. Pupils are equal, round, and reactive to light.  Neck: Normal range of motion. Neck supple. No spinous process tenderness and no muscular tenderness present. No rigidity. Normal range of motion present.  Cardiovascular: Normal rate, regular rhythm, normal heart sounds and intact distal pulses.   No murmur heard. Pulmonary/Chest: Effort normal  and breath sounds normal. She has no wheezes. She has no rales.  Abdominal: Soft. She exhibits no distension. There is no tenderness. There is no CVA tenderness.  Musculoskeletal: She exhibits no tenderness.       Cervical back: She exhibits tenderness, bony tenderness, pain and spasm.       Lumbar back: She exhibits tenderness and pain. She exhibits normal range of motion, no swelling, no deformity and normal pulse.       Spasm in bilateral trapezium muscles  Neurological: She is alert and oriented to person, place, and time. Coordination normal.  Reflex Scores:      Patellar reflexes are 1+ on the right side and 1+ on the left side. Skin: Skin is warm and dry. No rash noted.  Psychiatric: She has a normal mood and affect.    ED Course  Procedures (including critical care time)  Labs Reviewed - No data to display Dg Cervical Spine Complete  02/17/2011  *RADIOLOGY REPORT*  Clinical Data: Neck stiffness, onset of pain at work  CERVICAL SPINE - COMPLETE 4+ VIEW  Comparison: None  Findings: Question osseous demineralization versus artifact from overpenetration. Vertebral body and disc space heights maintained. Prevertebral soft tissues normal thickness. Bony foramina patent. No acute fracture, subluxation or bone destruction. Questionable sclerotic focus anterolateral right second rib versus summation artifact.  IMPRESSION: No acute cervical spine abnormalities. Questionable nonspecific sclerotic focus at right second rib, of uncertain significance.  Original Report Authenticated By: Lollie Marrow, M.D.   Dg Lumbar Spine Complete  02/17/2011  *RADIOLOGY REPORT*  Clinical Data: Low back pain, onset at work  LUMBAR SPINE - COMPLETE 4+ VIEW  Comparison: None  Findings: Five non-rib bearing lumbar vertebrae. Bones appear demineralized. Mild broad-based levoconvex scoliosis questioned versus positional finding. Vertebral body and disc space heights maintained. No acute fracture, subluxation or bone  destruction. No spondylolysis. SI joints grossly unremarkable.  IMPRESSION: Question mild levoconvex scoliosis. No acute bony abnormalities.  Original Report Authenticated By: Lollie Marrow, M.D.     No diagnosis found.    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  No neurovascular compromise and no incontinence.  Pt has no infectious sx, hx of CA  or other red flags concerning for pathologic back pain.  Pt is able to ambulate.  Normal strength and reflexes on exam.  States did feel her neck and back pop when picking up the pt. Will give pt pain control and check C and L spine films.  Films neg.  Will give muscle relaxor and NSAIDs.         Gwyneth Sprout, MD 02/17/11 1353

## 2011-02-17 NOTE — ED Notes (Signed)
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

## 2011-03-15 ENCOUNTER — Encounter (HOSPITAL_BASED_OUTPATIENT_CLINIC_OR_DEPARTMENT_OTHER): Payer: Self-pay | Admitting: *Deleted

## 2011-03-15 ENCOUNTER — Emergency Department (HOSPITAL_BASED_OUTPATIENT_CLINIC_OR_DEPARTMENT_OTHER)
Admission: EM | Admit: 2011-03-15 | Discharge: 2011-03-16 | Disposition: A | Discharge status: Home / Self Care | Payer: Self-pay | Attending: Emergency Medicine | Admitting: Emergency Medicine

## 2011-03-15 DIAGNOSIS — E7800 Pure hypercholesterolemia, unspecified: Secondary | ICD-10-CM | POA: Insufficient documentation

## 2011-03-15 DIAGNOSIS — I10 Essential (primary) hypertension: Secondary | ICD-10-CM | POA: Insufficient documentation

## 2011-03-15 DIAGNOSIS — F17200 Nicotine dependence, unspecified, uncomplicated: Secondary | ICD-10-CM | POA: Insufficient documentation

## 2011-03-15 DIAGNOSIS — E079 Disorder of thyroid, unspecified: Secondary | ICD-10-CM | POA: Insufficient documentation

## 2011-03-15 DIAGNOSIS — Z79899 Other long term (current) drug therapy: Secondary | ICD-10-CM | POA: Insufficient documentation

## 2011-03-15 DIAGNOSIS — E1169 Type 2 diabetes mellitus with other specified complication: Secondary | ICD-10-CM | POA: Insufficient documentation

## 2011-03-15 DIAGNOSIS — F319 Bipolar disorder, unspecified: Secondary | ICD-10-CM | POA: Insufficient documentation

## 2011-03-15 DIAGNOSIS — R739 Hyperglycemia, unspecified: Secondary | ICD-10-CM

## 2011-03-15 LAB — GLUCOSE, CAPILLARY: Glucose-Capillary: 161 mg/dL — ABNORMAL HIGH (ref 70–99)

## 2011-03-15 NOTE — ED Notes (Signed)
Pt states she feels like her sugar has been up since yesterday. Ordered a steak and cheese sub at work and has felt bed since.
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
## 2011-03-16 ENCOUNTER — Encounter (HOSPITAL_BASED_OUTPATIENT_CLINIC_OR_DEPARTMENT_OTHER): Payer: Self-pay

## 2011-03-16 MED ORDER — ONDANSETRON 4 MG PO TBDP: 4.0000 mg | ORAL_TABLET | Freq: Three times a day (TID) | ORAL | Status: AC | PRN

## 2011-04-02 ENCOUNTER — Encounter (HOSPITAL_BASED_OUTPATIENT_CLINIC_OR_DEPARTMENT_OTHER): Payer: Self-pay | Admitting: *Deleted

## 2011-04-02 ENCOUNTER — Emergency Department (HOSPITAL_BASED_OUTPATIENT_CLINIC_OR_DEPARTMENT_OTHER)
Admission: EM | Admit: 2011-04-02 | Discharge: 2011-04-02 | Discharge status: Against Medical Advice | Payer: Self-pay | Attending: Emergency Medicine | Admitting: Emergency Medicine

## 2011-04-02 DIAGNOSIS — R739 Hyperglycemia, unspecified: Secondary | ICD-10-CM

## 2011-04-02 DIAGNOSIS — R7309 Other abnormal glucose: Secondary | ICD-10-CM | POA: Insufficient documentation

## 2011-04-02 LAB — GLUCOSE, CAPILLARY: Glucose-Capillary: 371 mg/dL — ABNORMAL HIGH (ref 70–99)

## 2011-04-02 NOTE — ED Notes (Signed)
BS 371

## 2011-04-02 NOTE — ED Notes (Signed)
Pt sent here from pMD office for High BS abd n/d

## 2011-04-03 ENCOUNTER — Encounter (HOSPITAL_BASED_OUTPATIENT_CLINIC_OR_DEPARTMENT_OTHER): Payer: Self-pay | Admitting: *Deleted

## 2011-04-03 ENCOUNTER — Emergency Department (HOSPITAL_BASED_OUTPATIENT_CLINIC_OR_DEPARTMENT_OTHER)
Admission: EM | Admit: 2011-04-03 | Discharge: 2011-04-03 | Disposition: A | Discharge status: Home / Self Care | Payer: Self-pay | Attending: Emergency Medicine | Admitting: Emergency Medicine

## 2011-04-03 DIAGNOSIS — I10 Essential (primary) hypertension: Secondary | ICD-10-CM | POA: Insufficient documentation

## 2011-04-03 DIAGNOSIS — E108 Type 1 diabetes mellitus with unspecified complications: Secondary | ICD-10-CM | POA: Insufficient documentation

## 2011-04-03 DIAGNOSIS — Z794 Long term (current) use of insulin: Secondary | ICD-10-CM | POA: Insufficient documentation

## 2011-04-03 DIAGNOSIS — E7800 Pure hypercholesterolemia, unspecified: Secondary | ICD-10-CM | POA: Insufficient documentation

## 2011-04-03 DIAGNOSIS — E079 Disorder of thyroid, unspecified: Secondary | ICD-10-CM | POA: Insufficient documentation

## 2011-04-03 DIAGNOSIS — E1065 Type 1 diabetes mellitus with hyperglycemia: Secondary | ICD-10-CM

## 2011-04-03 DIAGNOSIS — F319 Bipolar disorder, unspecified: Secondary | ICD-10-CM | POA: Insufficient documentation

## 2011-04-03 LAB — GLUCOSE, CAPILLARY: Glucose-Capillary: 337 mg/dL — ABNORMAL HIGH (ref 70–99)

## 2011-04-03 MED ORDER — PENICILLIN V POTASSIUM 500 MG PO TABS: 500.0000 mg | ORAL_TABLET | Freq: Three times a day (TID) | ORAL | Status: AC

## 2011-04-03 NOTE — ED Notes (Signed)
Patient ate cereal and milk at 0630 just prior to arrival.

## 2011-04-03 NOTE — ED Notes (Signed)
Patient states she had high blood sugar yesterday of 300+.  Called her doctor and was told to come to the ed.  Was here yesterday and signed out ama.  Today she is stating she had high blood sugar which was 89 this morning.  Patient is very angry because she was not seen yesterday.

## 2011-04-03 NOTE — ED Provider Notes (Signed)
History     CSN: [id number]  Arrival date & time 04/03/11  7253   First MD Initiated Contact with Patient 04/03/11 [phone]      Chief Complaint  Patient presents with  . Hyperglycemia    blood sugar 89    (Consider location/radiation/quality/duration/timing/severity/associated sxs/prior treatment) HPI  Patient states she was here for hyperglycemia yesterday, because her teeth and mouth were hurting.  She was seen at her pmd because she had diarrhea.  She had loose stools x 4, no fever, vomited x 2.  She was seen by her pmd and bs was "high" and told to come to hospital.  Patient came here states she waited a long time and left ama.  She states bs was 350 here.  Diarrhea and vomiting resolved.  BS this a.m. 89. Patient ate breakfast with frosted flakes and blood sugar increased to 337.  Patient took usual a.m. Regular 10 units.  Patient with toothache and states she is not eating as much as usual.    Past Medical History  Diagnosis Date  . Diabetes mellitus   . Seizures   . Thyroid disease   . Hypertension   . High cholesterol   . Bipolar 1 disorder     Past Surgical History  Procedure Date  . Tubal ligation   . Ankle surgery     No family history on file.  History  Substance Use Topics  . Smoking status: Current Some Day Smoker -- 0.5 packs/day    Types: Cigarettes  . Smokeless tobacco: Not on file  . Alcohol Use: No    OB History    Grav Para Term Preterm Abortions TAB SAB Ect Mult Living                  Review of Systems  All other systems reviewed and are negative.    Allergies  Review of patient's allergies indicates no known allergies.  Home Medications   Current Outpatient Rx  Name Route Sig Dispense Refill  . ARIPIPRAZOLE 10 MG PO TABS Oral Take 10 mg by mouth daily.      . ASPIRIN 81 MG PO TABS Oral Take 81 mg by mouth daily.      . INSULIN ASPART 100 UNIT/ML [place] SOLN Subcutaneous Inject 10-12 Units into the skin 3 (three) times daily. 10 units  with breakfast, lunch, and dinner when blood sugar is below 200. 12 units when blood sugar 200 or above.     . INSULIN GLARGINE 100 UNIT/ML [place] SOLN Subcutaneous Inject 45 Units into the skin at bedtime.     Marland Kitchen LEVOTHYROXINE SODIUM 125 MCG PO TABS Oral Take 125 mcg by mouth daily.      Marland Kitchen LISINOPRIL PO Oral Take 1 tablet by mouth daily.      Marland Kitchen NICOTINE 10 MG IN INHA Inhalation Inhale 2 puffs into the lungs daily as needed. To stop smoking     . PHENYTOIN SODIUM EXTENDED 100 MG PO CAPS Oral Take 200 mg by mouth 2 (two) times daily.     . QUETIAPINE FUMARATE 25 MG PO TABS Oral Take 25 mg by mouth at bedtime.      Marland Kitchen SIMVASTATIN PO Oral Take 1 tablet by mouth daily.     Marland Kitchen ZOLPIDEM TARTRATE 10 MG PO TABS Oral Take 10 mg by mouth at bedtime as needed. For sleep       BP 120/80  Pulse 114  Temp(Src) 97.6 F (36.4 C) (Oral)  Resp 20  Ht 5\' 4"  (1.626 m)  Wt 133 lb (60.328 kg)  BMI 22.83 kg/m2  SpO2 100%  LMP 03/19/2011  Physical Exam  Nursing note and vitals reviewed. Constitutional: She is oriented to person, place, and time. She appears well-developed and well-nourished.  HENT:  Head: Normocephalic and atraumatic.       Diffuse poor dentition, no abscess noted, some tenderness noted over right upper second molar  Eyes: Conjunctivae and EOM are normal. Pupils are equal, round, and reactive to light.  Neck: Normal range of motion. Neck supple.  Cardiovascular: Normal rate, regular rhythm, normal heart sounds and intact distal pulses.   Pulmonary/Chest: Effort normal and breath sounds normal.  Abdominal: Soft. Bowel sounds are normal.  Musculoskeletal: Normal range of motion.  Neurological: She is oriented to person, place, and time. She has normal reflexes.  Skin: Skin is warm and dry.  Psychiatric: She has a normal mood and affect. Her behavior is normal. Judgment and thought content normal.    ED Course  Procedures (including critical care time)  Labs Reviewed - No data to display No  results found.   No diagnosis found.    MDM  Patient without current complaints.  Patient with hyperglycemia as per expected after breakfast of frosted flakes.  Patient instructed regarding diet and scheduled for follow up at her doctor's office tomorrow per previous schedule.          Hilario Quarry, MD 04/03/11 [phone]

## 2011-05-01 ENCOUNTER — Encounter (HOSPITAL_BASED_OUTPATIENT_CLINIC_OR_DEPARTMENT_OTHER): Payer: Self-pay | Admitting: Family Medicine

## 2011-05-01 ENCOUNTER — Emergency Department (HOSPITAL_BASED_OUTPATIENT_CLINIC_OR_DEPARTMENT_OTHER)
Admission: EM | Admit: 2011-05-01 | Discharge: 2011-05-01 | Disposition: A | Discharge status: Home / Self Care | Payer: Self-pay | Attending: Emergency Medicine | Admitting: Emergency Medicine

## 2011-05-01 DIAGNOSIS — J329 Chronic sinusitis, unspecified: Secondary | ICD-10-CM | POA: Insufficient documentation

## 2011-05-01 DIAGNOSIS — E079 Disorder of thyroid, unspecified: Secondary | ICD-10-CM | POA: Insufficient documentation

## 2011-05-01 DIAGNOSIS — E7800 Pure hypercholesterolemia, unspecified: Secondary | ICD-10-CM | POA: Insufficient documentation

## 2011-05-01 DIAGNOSIS — I10 Essential (primary) hypertension: Secondary | ICD-10-CM | POA: Insufficient documentation

## 2011-05-01 DIAGNOSIS — K089 Disorder of teeth and supporting structures, unspecified: Secondary | ICD-10-CM | POA: Insufficient documentation

## 2011-05-01 DIAGNOSIS — F17200 Nicotine dependence, unspecified, uncomplicated: Secondary | ICD-10-CM | POA: Insufficient documentation

## 2011-05-01 DIAGNOSIS — K0889 Other specified disorders of teeth and supporting structures: Secondary | ICD-10-CM

## 2011-05-01 DIAGNOSIS — E119 Type 2 diabetes mellitus without complications: Secondary | ICD-10-CM | POA: Insufficient documentation

## 2011-05-01 DIAGNOSIS — F319 Bipolar disorder, unspecified: Secondary | ICD-10-CM | POA: Insufficient documentation

## 2011-05-01 DIAGNOSIS — Z79899 Other long term (current) drug therapy: Secondary | ICD-10-CM | POA: Insufficient documentation

## 2011-05-01 MED ORDER — TRAMADOL HCL 50 MG PO TABS
50.0000 mg | ORAL_TABLET | Freq: Once | ORAL | Status: AC
  Administered 2011-05-01: 50 mg via ORAL
  Filled 2011-05-01: qty 1

## 2011-05-01 MED ORDER — AMOXICILLIN-POT CLAVULANATE 875-125 MG PO TABS
1.0000 | ORAL_TABLET | Freq: Once | ORAL | Status: AC
  Administered 2011-05-01: 1 via ORAL
  Filled 2011-05-01: qty 1

## 2011-05-01 MED ORDER — OXYMETAZOLINE HCL 0.05 % NA SOLN
1.0000 | Freq: Once | NASAL | Status: AC
  Administered 2011-05-01: 1 via NASAL
  Filled 2011-05-01: qty 15

## 2011-05-01 MED ORDER — AMOXICILLIN-POT CLAVULANATE 875-125 MG PO TABS: 1.0000 | ORAL_TABLET | Freq: Two times a day (BID) | ORAL | Status: AC

## 2011-05-01 MED ORDER — TRAMADOL HCL 50 MG PO TABS: 50.0000 mg | ORAL_TABLET | Freq: Three times a day (TID) | ORAL | Status: AC | PRN

## 2011-05-01 NOTE — ED Notes (Signed)
Pt c/o right lower "gum aching because tooth is gone" and right ear pain x 1 day. Pt denies fever, v/d.

## 2011-05-01 NOTE — ED Provider Notes (Signed)
History     CSN: [id number]  Arrival date & time 05/01/11  1417   First MD Initiated Contact with Patient 05/01/11 1429      Chief Complaint  Patient presents with  . Dental Pain  . Otalgia    (Consider location/radiation/quality/duration/timing/severity/associated sxs/prior treatment) HPI  Past Medical History  Diagnosis Date  . Diabetes mellitus   . Seizures   . Thyroid disease   . Hypertension   . High cholesterol   . Bipolar 1 disorder     Past Surgical History  Procedure Date  . Tubal ligation   . Ankle surgery     No family history on file.  History  Substance Use Topics  . Smoking status: Current Some Day Smoker -- 0.5 packs/day    Types: Cigarettes  . Smokeless tobacco: Not on file  . Alcohol Use: No    OB History    Grav Para Term Preterm Abortions TAB SAB Ect Mult Living                  Review of Systems  Constitutional: Positive for fever. Negative for chills.  HENT: Positive for congestion, sore throat, rhinorrhea, mouth sores, dental problem and sinus pressure. Negative for hearing loss, trouble swallowing, voice change and ear discharge.   Eyes: Negative for visual disturbance.  Respiratory: Negative.   Cardiovascular: Negative.   Gastrointestinal: Negative for nausea, vomiting and diarrhea.  Genitourinary: Negative.   Musculoskeletal: Negative.   Skin: Negative for rash.  Neurological: Positive for headaches. Negative for dizziness, syncope and light-headedness.    Allergies  Review of patient's allergies indicates no known allergies.  Home Medications   Current Outpatient Rx  Name Route Sig Dispense Refill  . ARIPIPRAZOLE 10 MG PO TABS Oral Take 10 mg by mouth daily.      . ASPIRIN 81 MG PO TABS Oral Take 81 mg by mouth daily.      . INSULIN ASPART 100 UNIT/ML [place] SOLN Subcutaneous Inject 10-12 Units into the skin 3 (three) times daily. 10 units with breakfast, lunch, and dinner when blood sugar is below 200. 12 units when blood  sugar 200 or above.     . INSULIN GLARGINE 100 UNIT/ML [place] SOLN Subcutaneous Inject 45 Units into the skin at bedtime.     Marland Kitchen LEVOTHYROXINE SODIUM 125 MCG PO TABS Oral Take 125 mcg by mouth daily.      Marland Kitchen LISINOPRIL PO Oral Take 1 tablet by mouth daily.      Marland Kitchen NICOTINE 10 MG IN INHA Inhalation Inhale 2 puffs into the lungs daily as needed. To stop smoking     . PHENYTOIN SODIUM EXTENDED 100 MG PO CAPS Oral Take 200 mg by mouth 2 (two) times daily.     . QUETIAPINE FUMARATE 25 MG PO TABS Oral Take 25 mg by mouth at bedtime.      Marland Kitchen SIMVASTATIN PO Oral Take 1 tablet by mouth daily.     Marland Kitchen ZOLPIDEM TARTRATE 10 MG PO TABS Oral Take 10 mg by mouth at bedtime as needed. For sleep       BP 119/70  Pulse 111  Temp(Src) 97.5 F (36.4 C) (Oral)  Resp 16  Ht 5\' 4"  (1.626 m)  Wt 125 lb (56.7 kg)  BMI 21.46 kg/m2  SpO2 99%  LMP 03/19/2011  Physical Exam  Constitutional: She is oriented to person, place, and time. She appears well-developed and well-nourished. No distress.  HENT:  Head: Normocephalic and atraumatic. No trismus in  the jaw.  Right Ear: Hearing, external ear and ear canal normal. A middle ear effusion is present.  Nose:    Mouth/Throat: Oropharynx is clear and moist. No oral lesions. Abnormal dentition. No dental abscesses, uvula swelling, lacerations or dental caries. No oropharyngeal exudate, posterior oropharyngeal edema, posterior oropharyngeal erythema or tonsillar abscesses.    Eyes: Conjunctivae and EOM are normal. Pupils are equal, round, and reactive to light.  Cardiovascular: Regular rhythm.  Tachycardia present.   Pulmonary/Chest: Effort normal. No respiratory distress.  Abdominal: She exhibits no distension.  Musculoskeletal: She exhibits no edema.  Neurological: She is alert and oriented to person, place, and time. No cranial nerve deficit. She exhibits normal muscle tone. Coordination normal.  Skin: Skin is warm and dry. She is not diaphoretic. No erythema.    Psychiatric: She has a normal mood and affect.    ED Course  Procedures (including critical care time)  Labs Reviewed - No data to display No results found.   No diagnosis found.    MDM  This 36 year old female with diabetes now presents with one day of pain in her right jaw. On exam the patient has mild middle ear effusion, tenderness to palpation about the right maxillary sinus, and no overt signs of infection near her previously removed teeth. Given the patient's tachycardia, presentation there is concern for sinusitis. The patient will be prescribed antibiotics, analgesics and decongestants. She already has follow-up scheduled.  D/C in stable condition.        Gerhard Munch, MD 05/01/11 1454

## 2012-04-01 ENCOUNTER — Emergency Department (HOSPITAL_BASED_OUTPATIENT_CLINIC_OR_DEPARTMENT_OTHER)
Admission: EM | Admit: 2012-04-01 | Discharge: 2012-04-02 | Disposition: A | Discharge status: Home / Self Care | Payer: Self-pay | Attending: Emergency Medicine | Admitting: Emergency Medicine

## 2012-04-01 ENCOUNTER — Encounter (HOSPITAL_BASED_OUTPATIENT_CLINIC_OR_DEPARTMENT_OTHER): Payer: Self-pay | Admitting: *Deleted

## 2012-04-01 DIAGNOSIS — E7800 Pure hypercholesterolemia, unspecified: Secondary | ICD-10-CM | POA: Insufficient documentation

## 2012-04-01 DIAGNOSIS — Z9889 Other specified postprocedural states: Secondary | ICD-10-CM | POA: Insufficient documentation

## 2012-04-01 DIAGNOSIS — I10 Essential (primary) hypertension: Secondary | ICD-10-CM | POA: Insufficient documentation

## 2012-04-01 DIAGNOSIS — Z794 Long term (current) use of insulin: Secondary | ICD-10-CM | POA: Insufficient documentation

## 2012-04-01 DIAGNOSIS — F319 Bipolar disorder, unspecified: Secondary | ICD-10-CM | POA: Insufficient documentation

## 2012-04-01 DIAGNOSIS — M79609 Pain in unspecified limb: Secondary | ICD-10-CM | POA: Insufficient documentation

## 2012-04-01 DIAGNOSIS — G8918 Other acute postprocedural pain: Secondary | ICD-10-CM | POA: Insufficient documentation

## 2012-04-01 DIAGNOSIS — F17200 Nicotine dependence, unspecified, uncomplicated: Secondary | ICD-10-CM | POA: Insufficient documentation

## 2012-04-01 DIAGNOSIS — Z79899 Other long term (current) drug therapy: Secondary | ICD-10-CM | POA: Insufficient documentation

## 2012-04-01 DIAGNOSIS — Z7982 Long term (current) use of aspirin: Secondary | ICD-10-CM | POA: Insufficient documentation

## 2012-04-01 DIAGNOSIS — R569 Unspecified convulsions: Secondary | ICD-10-CM | POA: Insufficient documentation

## 2012-04-01 DIAGNOSIS — E119 Type 2 diabetes mellitus without complications: Secondary | ICD-10-CM | POA: Insufficient documentation

## 2012-04-01 DIAGNOSIS — E079 Disorder of thyroid, unspecified: Secondary | ICD-10-CM | POA: Insufficient documentation

## 2012-04-01 DIAGNOSIS — M79606 Pain in leg, unspecified: Secondary | ICD-10-CM

## 2012-04-01 LAB — CBC WITH DIFFERENTIAL/PLATELET
Basophils Absolute: 0 10*3/uL (ref 0.0–0.1)
Basophils Relative: 0 % (ref 0–1)
HCT: 33.2 % — ABNORMAL LOW (ref 36.0–46.0)
Hemoglobin: 11.6 g/dL — ABNORMAL LOW (ref 12.0–15.0)
Lymphocytes Relative: 49 % — ABNORMAL HIGH (ref 12–46)
MCHC: 34.9 g/dL (ref 30.0–36.0)
Monocytes Absolute: 0.5 10*3/uL (ref 0.1–1.0)
Monocytes Relative: 7 % (ref 3–12)
Neutro Abs: 3.3 10*3/uL (ref 1.7–7.7)
Neutrophils Relative %: 43 % (ref 43–77)
RBC: 3.73 MIL/uL — ABNORMAL LOW (ref 3.87–5.11)
WBC: 7.7 10*3/uL (ref 4.0–10.5)

## 2012-04-01 MED ORDER — TRAMADOL HCL 50 MG PO TABS
50.0000 mg | ORAL_TABLET | Freq: Once | ORAL | Status: AC
  Administered 2012-04-01: 50 mg via ORAL
  Filled 2012-04-01: qty 1

## 2012-04-01 NOTE — ED Notes (Signed)
Family at bedside. 

## 2012-04-01 NOTE — ED Provider Notes (Signed)
History     CSN: [id number]  Arrival date & time 04/01/12  2234   First MD Initiated Contact with Patient 04/01/12 2317      Chief Complaint  Patient presents with  . Leg Pain    (Consider location/radiation/quality/duration/timing/severity/associated sxs/prior treatment) Patient is a 36 y.o. female presenting with leg pain. The history is provided by the patient.  Leg Pain  The incident occurred more than 1 week ago. The incident occurred at home. Injury mechanism: post surgery on ankles in 2009. The pain location is generalized. The quality of the pain is described as aching. The pain is severe. The pain has been constant since onset. Pertinent negatives include no numbness, no inability to bear weight, no loss of motion, no muscle weakness, no loss of sensation and no tingling. She reports no foreign bodies present. Nothing aggravates the symptoms. She has tried nothing for the symptoms. The treatment provided no relief.  On neurontin for pain not helping x 2 days walking a lot at work.  No falls.  Past Medical History  Diagnosis Date  . Diabetes mellitus   . Seizures   . Thyroid disease   . Hypertension   . High cholesterol   . Bipolar 1 disorder     Past Surgical History  Procedure Date  . Tubal ligation   . Ankle surgery     No family history on file.  History  Substance Use Topics  . Smoking status: Current Some Day Smoker -- 0.5 packs/day    Types: Cigarettes  . Smokeless tobacco: Not on file  . Alcohol Use: No    OB History    Grav Para Term Preterm Abortions TAB SAB Ect Mult Living                  Review of Systems  Neurological: Negative for tingling and numbness.  All other systems reviewed and are negative.    Allergies  Review of patient's allergies indicates no known allergies.  Home Medications   Current Outpatient Rx  Name  Route  Sig  Dispense  Refill  . ARIPIPRAZOLE 10 MG PO TABS   Oral   Take 10 mg by mouth daily.            . ASPIRIN 81 MG PO TABS   Oral   Take 81 mg by mouth daily.           . INSULIN ASPART 100 UNIT/ML [place] SOLN   Subcutaneous   Inject 10-12 Units into the skin 3 (three) times daily. 10 units with breakfast, lunch, and dinner when blood sugar is below 200. 12 units when blood sugar 200 or above.          . INSULIN GLARGINE 100 UNIT/ML [place] SOLN   Subcutaneous   Inject 45 Units into the skin at bedtime.          Marland Kitchen LEVOTHYROXINE SODIUM 125 MCG PO TABS   Oral   Take 125 mcg by mouth daily.           Marland Kitchen LISINOPRIL PO   Oral   Take 1 tablet by mouth daily.           Marland Kitchen NICOTINE 10 MG IN INHA   Inhalation   Inhale 2 puffs into the lungs daily as needed. To stop smoking          . PHENYTOIN SODIUM EXTENDED 100 MG PO CAPS   Oral   Take 200 mg by mouth 2 (two)  times daily.          . QUETIAPINE FUMARATE 25 MG PO TABS   Oral   Take 25 mg by mouth at bedtime.           Marland Kitchen SIMVASTATIN PO   Oral   Take 1 tablet by mouth daily.          Marland Kitchen ZOLPIDEM TARTRATE 10 MG PO TABS   Oral   Take 10 mg by mouth at bedtime as needed. For sleep            BP 106/63  Pulse 112  Temp 98 F (36.7 C) (Oral)  Resp 18  Ht 5\' 5"  (1.651 m)  Wt 137 lb (62.143 kg)  BMI 22.80 kg/m2  SpO2 100%  Physical Exam  Constitutional: She is oriented to person, place, and time. She appears well-developed and well-nourished.  HENT:  Head: Normocephalic and atraumatic.  Mouth/Throat: Oropharynx is clear and moist.  Eyes: Conjunctivae normal are normal. Pupils are equal, round, and reactive to light.  Neck: Normal range of motion. Neck supple.  Cardiovascular: Normal rate, regular rhythm and intact distal pulses.   Pulmonary/Chest: Effort normal and breath sounds normal.  Abdominal: Soft. Bowel sounds are normal. There is no tenderness.  Musculoskeletal: Normal range of motion. She exhibits no edema and no tenderness.       FROM with 5/5 strength of BLE. Sensation intact cap refill < 2 sec to  toes B.    Neurological: She is alert and oriented to person, place, and time. She has normal reflexes.  Skin: Skin is warm and dry. No rash noted. No erythema. No pallor.  Psychiatric: She has a normal mood and affect.    ED Course  Procedures (including critical care time)  Labs Reviewed - No data to display No results found.   No diagnosis found.    MDM  Neuropathic pain from surgery in 2009.  Worse due to increased activity in the past 2 days.  Follow up with your PMD.  Elevate legs nsaids for pain.  Patient verbalize understanding and agrees to follow up       Sheritha Louis K Brayten Komar-Rasch, MD 04/02/12 [phone]
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
## 2012-04-02 LAB — BASIC METABOLIC PANEL
BUN: 12 mg/dL (ref 6–23)
CO2: 25 mEq/L (ref 19–32)
Chloride: 99 mEq/L (ref 96–112)
Creatinine, Ser: 0.5 mg/dL (ref 0.50–1.10)
GFR calc Af Amer: >90 mL/min (ref >90)
Potassium: 3.7 mEq/L (ref 3.5–5.1)

## 2012-04-02 MED ORDER — NAPROXEN 375 MG PO TABS: 375.0000 mg | ORAL_TABLET | Freq: Two times a day (BID) | ORAL | Status: DC

## 2012-06-19 ENCOUNTER — Encounter (HOSPITAL_BASED_OUTPATIENT_CLINIC_OR_DEPARTMENT_OTHER): Payer: Self-pay | Admitting: Emergency Medicine

## 2012-06-19 ENCOUNTER — Inpatient Hospital Stay (HOSPITAL_BASED_OUTPATIENT_CLINIC_OR_DEPARTMENT_OTHER): Payer: Self-pay

## 2012-06-19 ENCOUNTER — Inpatient Hospital Stay (HOSPITAL_BASED_OUTPATIENT_CLINIC_OR_DEPARTMENT_OTHER)
Admission: EM | Admit: 2012-06-19 | Discharge: 2012-06-22 | DRG: 637 | Disposition: A | Discharge status: Home / Self Care | Payer: MEDICAID | Attending: Internal Medicine | Admitting: Internal Medicine

## 2012-06-19 DIAGNOSIS — Z7982 Long term (current) use of aspirin: Secondary | ICD-10-CM

## 2012-06-19 DIAGNOSIS — I10 Essential (primary) hypertension: Secondary | ICD-10-CM | POA: Diagnosis present

## 2012-06-19 DIAGNOSIS — Z79899 Other long term (current) drug therapy: Secondary | ICD-10-CM

## 2012-06-19 DIAGNOSIS — E039 Hypothyroidism, unspecified: Secondary | ICD-10-CM

## 2012-06-19 DIAGNOSIS — D638 Anemia in other chronic diseases classified elsewhere: Secondary | ICD-10-CM | POA: Diagnosis present

## 2012-06-19 DIAGNOSIS — E871 Hypo-osmolality and hyponatremia: Secondary | ICD-10-CM

## 2012-06-19 DIAGNOSIS — R112 Nausea with vomiting, unspecified: Secondary | ICD-10-CM

## 2012-06-19 DIAGNOSIS — E1069 Type 1 diabetes mellitus with other specified complication: Secondary | ICD-10-CM | POA: Diagnosis present

## 2012-06-19 DIAGNOSIS — IMO0002 Reserved for concepts with insufficient information to code with codable children: Secondary | ICD-10-CM | POA: Diagnosis present

## 2012-06-19 DIAGNOSIS — G9341 Metabolic encephalopathy: Secondary | ICD-10-CM | POA: Diagnosis present

## 2012-06-19 DIAGNOSIS — I959 Hypotension, unspecified: Secondary | ICD-10-CM | POA: Diagnosis not present

## 2012-06-19 DIAGNOSIS — E1110 Type 2 diabetes mellitus with ketoacidosis without coma: Secondary | ICD-10-CM

## 2012-06-19 DIAGNOSIS — F319 Bipolar disorder, unspecified: Secondary | ICD-10-CM | POA: Diagnosis present

## 2012-06-19 DIAGNOSIS — E1010 Type 1 diabetes mellitus with ketoacidosis without coma: Principal | ICD-10-CM

## 2012-06-19 DIAGNOSIS — G40909 Epilepsy, unspecified, not intractable, without status epilepticus: Secondary | ICD-10-CM

## 2012-06-19 DIAGNOSIS — F17200 Nicotine dependence, unspecified, uncomplicated: Secondary | ICD-10-CM | POA: Diagnosis present

## 2012-06-19 DIAGNOSIS — N390 Urinary tract infection, site not specified: Secondary | ICD-10-CM | POA: Diagnosis present

## 2012-06-19 DIAGNOSIS — Z794 Long term (current) use of insulin: Secondary | ICD-10-CM

## 2012-06-19 DIAGNOSIS — E7800 Pure hypercholesterolemia, unspecified: Secondary | ICD-10-CM | POA: Diagnosis present

## 2012-06-19 DIAGNOSIS — J069 Acute upper respiratory infection, unspecified: Secondary | ICD-10-CM | POA: Diagnosis present

## 2012-06-19 LAB — GLUCOSE, CAPILLARY
Glucose-Capillary: >600 mg/dL (ref 70–99)
Glucose-Capillary: 422 mg/dL — ABNORMAL HIGH (ref 70–99)
Glucose-Capillary: 295 mg/dL — ABNORMAL HIGH (ref 70–99)

## 2012-06-19 LAB — URINALYSIS, ROUTINE W REFLEX MICROSCOPIC
Nitrite: NEGATIVE
Specific Gravity, Urine: 1.026 (ref 1.005–1.030)
Urobilinogen, UA: 0.2 mg/dL (ref 0.0–1.0)
pH: 5 (ref 5.0–8.0)

## 2012-06-19 LAB — CBC
HCT: 35.5 % — ABNORMAL LOW (ref 36.0–46.0)
Hemoglobin: 12 g/dL (ref 12.0–15.0)
MCH: 30.8 pg (ref 26.0–34.0)
MCHC: 33.8 g/dL (ref 30.0–36.0)
MCV: 91 fL (ref 78.0–100.0)
Platelets: 340 10*3/uL (ref 150–400)
RBC: 3.9 MIL/uL (ref 3.87–5.11)
RDW: 13.5 % (ref 11.5–15.5)
WBC: 4.1 10*3/uL (ref 4.0–10.5)

## 2012-06-19 LAB — POCT I-STAT 3, ART BLOOD GAS (G3+)
Acid-base deficit: 14 mmol/L — ABNORMAL HIGH (ref 0.0–2.0)
Bicarbonate: 11.2 mEq/L — ABNORMAL LOW (ref 20.0–24.0)
O2 Saturation: 97 %
Patient temperature: 98.6
TCO2: 12 mmol/L (ref 0–100)
pCO2 arterial: 25.4 mmHg — ABNORMAL LOW (ref 35.0–45.0)
pH, Arterial: 7.253 — ABNORMAL LOW (ref 7.350–7.450)
pO2, Arterial: 104 mmHg — ABNORMAL HIGH (ref 80.0–100.0)

## 2012-06-19 LAB — HEPATIC FUNCTION PANEL
ALT: 16 U/L (ref 0–35)
AST: 25 U/L (ref 0–37)
Albumin: 3.9 g/dL (ref 3.5–5.2)
Alkaline Phosphatase: 127 U/L — ABNORMAL HIGH (ref 39–117)
Total Protein: 7.6 g/dL (ref 6.0–8.3)

## 2012-06-19 LAB — DIFFERENTIAL
Basophils Absolute: 0 10*3/uL (ref 0.0–0.1)
Basophils Relative: 1 % (ref 0–1)
Eosinophils Absolute: 0 10*3/uL (ref 0.0–0.7)
Eosinophils Relative: 0 % (ref 0–5)
Lymphocytes Relative: 18 % (ref 12–46)
Lymphs Abs: 0.7 10*3/uL (ref 0.7–4.0)
Monocytes Absolute: 0.3 10*3/uL (ref 0.1–1.0)
Monocytes Relative: 8 % (ref 3–12)
Neutro Abs: 3 10*3/uL (ref 1.7–7.7)
Neutrophils Relative %: 73 % (ref 43–77)

## 2012-06-19 LAB — BASIC METABOLIC PANEL
Chloride: 90 mEq/L — ABNORMAL LOW (ref 96–112)
GFR calc Af Amer: >90 mL/min (ref >90)
GFR calc non Af Amer: >90 mL/min (ref >90)
Potassium: 5.7 mEq/L — ABNORMAL HIGH (ref 3.5–5.1)
Sodium: 131 mEq/L — ABNORMAL LOW (ref 135–145)

## 2012-06-19 LAB — URINE MICROSCOPIC-ADD ON

## 2012-06-19 IMAGING — CR DG CHEST 1V PORT
1 series · 1 of 1 positions shown · non-contrast
Comparison: None.

CLINICAL DATA: Status post central line placement

PORTABLE CHEST - 1 VIEW

[view not recorded]
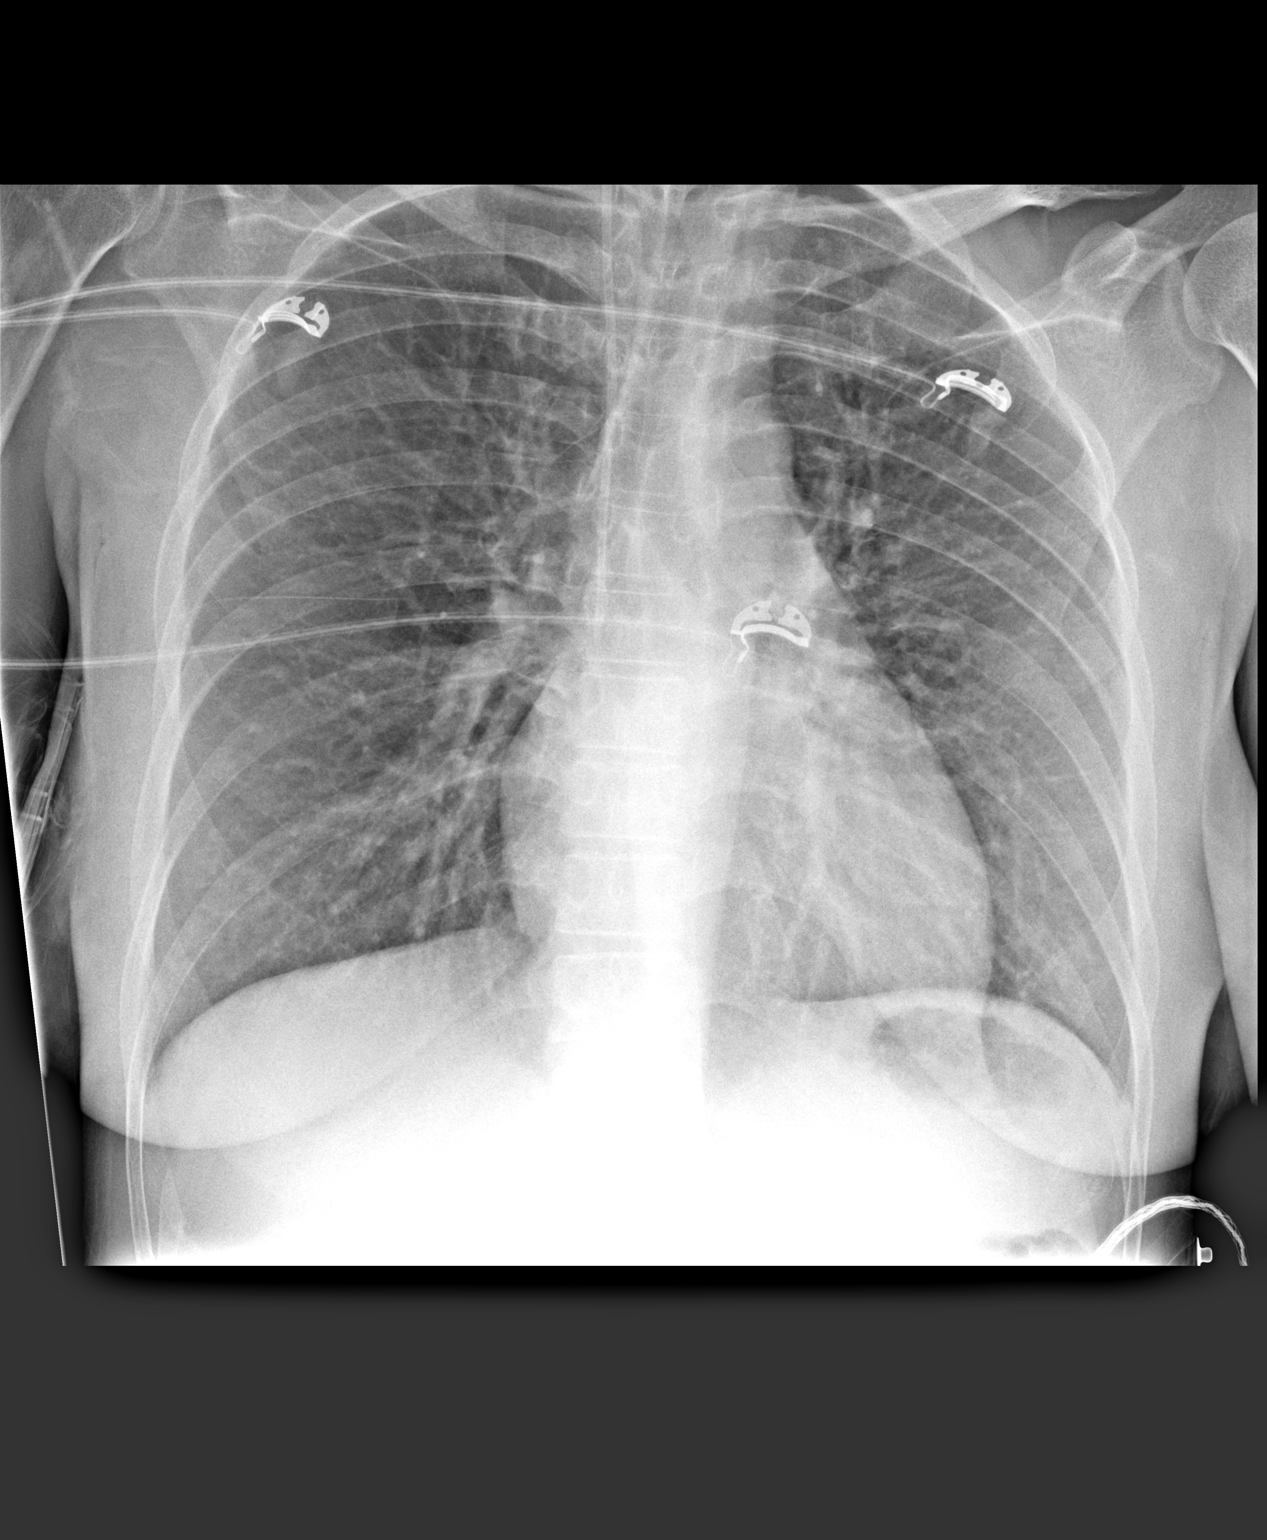

[1 of 1 positions shown; findings below may reference images not displayed]

FINDINGS: Right IJ central venous catheter.  The catheter tip
projects over the distal SVC.   No evidence of pneumothorax.  Given
portable technique, the lungs are clear.  Cardiac and mediastinal
contours within normal limits.  No acute osseous abnormality.
IMPRESSION: Right IJ central venous catheter with the tip projecting over the
distal SVC.  No evidence of complication or acute cardiopulmonary
disease.

## 2012-06-19 MED ORDER — INSULIN REGULAR HUMAN 100 UNIT/ML IJ SOLN
INTRAMUSCULAR | Status: AC
  Filled 2012-06-19: qty 1

## 2012-06-19 MED ORDER — SODIUM CHLORIDE 0.9 % IV SOLN
INTRAVENOUS | Status: DC
  Administered 2012-06-19 (×2): via INTRAVENOUS

## 2012-06-19 MED ORDER — SODIUM CHLORIDE 0.9 % IV SOLN
1000.0000 mL | INTRAVENOUS | Status: DC
  Administered 2012-06-19: 1000 mL via INTRAVENOUS

## 2012-06-19 MED ORDER — LIDOCAINE-EPINEPHRINE 2 %-1:100000 IJ SOLN
INTRAMUSCULAR | Status: AC
  Filled 2012-06-19: qty 1

## 2012-06-19 MED ORDER — SODIUM CHLORIDE 0.9 % IV SOLN
1000.0000 mL | Freq: Once | INTRAVENOUS | Status: AC
  Administered 2012-06-19: 1000 mL via INTRAVENOUS

## 2012-06-19 NOTE — ED Notes (Signed)
Assigned to 3303 @ [facility], RN aware, [facility] called for transport.

## 2012-06-19 NOTE — ED Notes (Signed)
Per EMS:  Pt has been feeling bad this am.  Blood sugar 594.  HR 112.

## 2012-06-19 NOTE — ED Provider Notes (Signed)
History  This chart was scribed for Hilario Quarry, MD, by Candelaria Stagers, ED Scribe. This patient was seen in room MH10/MH10 and the patient's care was started at 4:20 PM   CSN: [id number]  Arrival date & time 06/19/12  1530   First MD Initiated Contact with Patient 06/19/12 1549      Chief Complaint  Patient presents with  . Hyperglycemia  . Weakness    The history is provided by the patient. No language interpreter was used.   Zoe Clay is a 37 y.o. female who presents to the Emergency Department complaining of abdominal pain and hyperglycemia that started earlier today while at work.  Pt is diabetic.  She reports she felt fine this morning and reports her blood sugar level was around 300.  Pt ate breakfast and went to work and began experiencing abdominal pain, nausea, vomiting, and diarrhea around 11AM.  She reports she experienced diarrhea once and vomited twice.  She states her blood sugar level was 594 via EMS, which brought her to ED.  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  She has taken nothing for the abdominal pain.  Pt reports no current abdominal pain, but is still experiencing nausea.  Moving makes the pain worse.  She has a h/o thyroid problems, seizure, bipolar disorder, HTN, and high cholesterol.  She has experienced DKA once before last year.  Pt smokes.  She has had a tubal ligation and reports regular periods with her LMP 06/14/12.   Past Medical History  Diagnosis Date  . Diabetes mellitus   . Seizures   . Thyroid disease   . Hypertension   . High cholesterol   . Bipolar 1 disorder     Past Surgical History  Procedure Laterality Date  . Tubal ligation    . Ankle surgery      No family history on file.  History  Substance Use Topics  . Smoking status: Current Some Day Smoker -- 0.50 packs/day    Types: Cigarettes  . Smokeless tobacco: Not on file  . Alcohol Use: No    OB History   Grav Para Term Preterm  Abortions TAB SAB Ect Mult Living                  Review of Systems  Constitutional: Negative for fever and chills.       Hyperglycemia   Gastrointestinal: Positive for nausea, vomiting and diarrhea.  All other systems reviewed and are negative.    Allergies  Review of patient's allergies indicates no known allergies.  Home Medications   Current Outpatient Rx  Name  Route  Sig  Dispense  Refill  . ARIPiprazole (ABILIFY) 10 MG tablet   Oral   Take 10 mg by mouth daily.           Marland Kitchen aspirin 81 MG tablet   Oral   Take 81 mg by mouth daily.           . insulin aspart (NOVOLOG FLEXPEN) 100 UNIT/ML injection   Subcutaneous   Inject 10-12 Units into the skin 3 (three) times daily. 10 units with breakfast, lunch, and dinner when blood sugar is below 200. 12 units when blood sugar 200 or above.          . insulin glargine (LANTUS SOLOSTAR) 100 UNIT/ML injection   Subcutaneous   Inject 45 Units into the skin at bedtime.          Marland Kitchen  levothyroxine (SYNTHROID, LEVOTHROID) 125 MCG tablet   Oral   Take 125 mcg by mouth daily.           Marland Kitchen LISINOPRIL PO   Oral   Take 1 tablet by mouth daily.           . naproxen (NAPROSYN) 375 MG tablet   Oral   Take 1 tablet (375 mg total) by mouth 2 (two) times daily.   20 tablet   0   . nicotine (NICOTROL) 10 MG inhaler   Inhalation   Inhale 2 puffs into the lungs daily as needed. To stop smoking          . phenytoin (DILANTIN) 100 MG ER capsule   Oral   Take 200 mg by mouth 2 (two) times daily.          . QUEtiapine (SEROQUEL) 25 MG tablet   Oral   Take 25 mg by mouth at bedtime.           Marland Kitchen SIMVASTATIN PO   Oral   Take 1 tablet by mouth daily.          Marland Kitchen zolpidem (AMBIEN) 10 MG tablet   Oral   Take 10 mg by mouth at bedtime as needed. For sleep            BP 125/66  Pulse 113  Temp(Src) 98.6 F (37 C) (Oral)  Resp 18  Ht 5\' 5"  (1.651 m)  Wt 147 lb (66.679 kg)  BMI 24.46 kg/m2  SpO2  100%  Physical Exam  Nursing note and vitals reviewed. Constitutional: She is oriented to person, place, and time. She appears well-developed and well-nourished. No distress.  HENT:  Head: Normocephalic and atraumatic.  Eyes: EOM are normal.  Neck: Neck supple. No tracheal deviation present.  Cardiovascular: Regular rhythm.   Tachycardic   Pulmonary/Chest: Effort normal. No respiratory distress.  Abdominal: Soft. There is no tenderness.  No CVA tenderness bilaterally.   Musculoskeletal: Normal range of motion.  Neurological: She is alert and oriented to person, place, and time.  Skin: Skin is warm and dry.  Psychiatric: She has a normal mood and affect. Her behavior is normal.    ED Course  Procedures   DIAGNOSTIC STUDIES: Oxygen Saturation is 100% on room air, normal by my interpretation.    COORDINATION OF CARE:  4:30 PM Discussed course of care with pt which includes IV Fluids.  Pt understands and agrees.  Patient initially had peripheral line placed by nursing staff. This line became unusable and a second peripheral line was unable to be established. 1 attempt at external jugular placement was attempted but do to volume depletion collapse was seen on ultrasound and is unable to obtain access display. Contrary to this central line was placed. 8:55 PM US guided right sided internal jugular line placement performed by Dr. Margarita Grizzle. 20 cm triple lumen Distal and proximal port flushwith blood return. Medial port- unable to flush or draw blood.  Sterile procedures followed.  Pt tolerated procedures with no complications.  Flushed proximal and distal ports with draw back of blood, medial port will not draw back blood or flush.  Line is 15 cm in and sewn in place.  Post procedure xray ordered.   Labs Reviewed  GLUCOSE, CAPILLARY - Abnormal; Notable for the following:    Glucose-Capillary >600 (*)    All other components within normal limits  URINALYSIS, ROUTINE W REFLEX  MICROSCOPIC - Abnormal; Notable for the following:  Glucose, UA >1000 (*)    Ketones, ur >80 (*)    All other components within normal limits  BASIC METABOLIC PANEL - Abnormal; Notable for the following:    Sodium 131 (*)    Potassium 5.7 (*)    Chloride 90 (*)    CO2 13 (*)    Glucose, Bld 623 (*)    All other components within normal limits  CBC - Abnormal; Notable for the following:    HCT 35.5 (*)    All other components within normal limits  HEPATIC FUNCTION PANEL - Abnormal; Notable for the following:    Alkaline Phosphatase 127 (*)    Total Bilirubin 0.2 (*)    Indirect Bilirubin 0.1 (*)    All other components within normal limits  GLUCOSE, CAPILLARY - Abnormal; Notable for the following:    Glucose-Capillary 422 (*)    All other components within normal limits  GLUCOSE, CAPILLARY - Abnormal; Notable for the following:    Glucose-Capillary 323 (*)    All other components within normal limits  POCT I-STAT 3, BLOOD GAS (G3+) - Abnormal; Notable for the following:    pH, Arterial 7.253 (*)    pCO2 arterial 25.4 (*)    pO2, Arterial 104.0 (*)    Bicarbonate 11.2 (*)    Acid-base deficit 14.0 (*)    All other components within normal limits  URINE MICROSCOPIC-ADD ON  DIFFERENTIAL  CBC WITH DIFFERENTIAL  BLOOD GAS, ARTERIAL   No results found.   No diagnosis found.  Anion gap 28  Post procedure x-ray reviewed with distal catheter tip in good position and no evidence of pneumothorax. MDM  Patient is on glucose stabilizer.  Heart rate has decreased from 113-102. Blood pressure has remained systolically 12-110.   CRITICAL CARE Performed by: Hilario Quarry   Total critical care time: 75  Critical care time was exclusive of separately billable procedures and treating other patients.  Critical care was necessary to treat or prevent imminent or life-threatening deterioration.  Critical care was time spent personally by me on the following activities:  development of treatment plan with patient and/or surrogate as well as nursing, discussions with consultants, evaluation of patient's response to treatment, examination of patient, obtaining history from patient or surrogate, ordering and performing treatments and interventions, ordering and review of laboratory studies, ordering and review of radiographic studies, pulse oximetry and re-evaluation of patient's condition.    I personally performed the services described in this documentation, which was scribed in my presence. The recorded information has been reviewed and considered.    Hilario Quarry, MD 06/19/12 2219

## 2012-06-19 NOTE — ED Notes (Signed)
Insulin restarted at 2.2 units/ hour

## 2012-06-19 NOTE — ED Notes (Signed)
Insulin drip decreased to 2.6 units/ hour

## 2012-06-19 NOTE — ED Notes (Signed)
Insulin drip decreased to 3.6 units/ hour

## 2012-06-20 ENCOUNTER — Encounter (HOSPITAL_COMMUNITY): Payer: Self-pay | Admitting: Internal Medicine

## 2012-06-20 DIAGNOSIS — R112 Nausea with vomiting, unspecified: Secondary | ICD-10-CM

## 2012-06-20 DIAGNOSIS — E039 Hypothyroidism, unspecified: Secondary | ICD-10-CM | POA: Diagnosis present

## 2012-06-20 DIAGNOSIS — E1010 Type 1 diabetes mellitus with ketoacidosis without coma: Principal | ICD-10-CM | POA: Diagnosis present

## 2012-06-20 DIAGNOSIS — E871 Hypo-osmolality and hyponatremia: Secondary | ICD-10-CM | POA: Diagnosis present

## 2012-06-20 DIAGNOSIS — G40909 Epilepsy, unspecified, not intractable, without status epilepticus: Secondary | ICD-10-CM

## 2012-06-20 DIAGNOSIS — E1110 Type 2 diabetes mellitus with ketoacidosis without coma: Secondary | ICD-10-CM | POA: Diagnosis present

## 2012-06-20 LAB — LACTIC ACID, PLASMA: Lactic Acid, Venous: 0.5 mmol/L (ref 0.5–2.2)

## 2012-06-20 LAB — BASIC METABOLIC PANEL
BUN: 11 mg/dL (ref 6–23)
BUN: 9 mg/dL (ref 6–23)
BUN: 8 mg/dL (ref 6–23)
BUN: 10 mg/dL (ref 6–23)
CO2: 8 mEq/L — CL (ref 19–32)
CO2: 9 mEq/L — CL (ref 19–32)
Calcium: 7.9 mg/dL — ABNORMAL LOW (ref 8.4–10.5)
Calcium: 7.6 mg/dL — ABNORMAL LOW (ref 8.4–10.5)
Calcium: 7.4 mg/dL — ABNORMAL LOW (ref 8.4–10.5)
Chloride: 100 mEq/L (ref 96–112)
Chloride: 104 mEq/L (ref 96–112)
Chloride: 104 mEq/L (ref 96–112)
Chloride: 104 mEq/L (ref 96–112)
Creatinine, Ser: 0.42 mg/dL — ABNORMAL LOW (ref 0.50–1.10)
GFR calc Af Amer: >90 mL/min (ref >90)
GFR calc Af Amer: >90 mL/min (ref >90)
GFR calc Af Amer: >90 mL/min (ref >90)
GFR calc Af Amer: >90 mL/min (ref >90)
GFR calc Af Amer: >90 mL/min (ref >90)
GFR calc Af Amer: >90 mL/min (ref >90)
GFR calc non Af Amer: >90 mL/min (ref >90)
GFR calc non Af Amer: >90 mL/min (ref >90)
GFR calc non Af Amer: >90 mL/min (ref >90)
GFR calc non Af Amer: >90 mL/min (ref >90)
GFR calc non Af Amer: >90 mL/min (ref >90)
GFR calc non Af Amer: >90 mL/min (ref >90)
Glucose, Bld: 342 mg/dL — ABNORMAL HIGH (ref 70–99)
Glucose, Bld: 105 mg/dL — ABNORMAL HIGH (ref 70–99)
Potassium: 4.2 mEq/L (ref 3.5–5.1)
Potassium: 4.5 mEq/L (ref 3.5–5.1)
Potassium: 4.1 mEq/L (ref 3.5–5.1)
Potassium: 3.7 mEq/L (ref 3.5–5.1)
Potassium: 3.5 mEq/L (ref 3.5–5.1)
Sodium: 132 mEq/L — ABNORMAL LOW (ref 135–145)
Sodium: 133 mEq/L — ABNORMAL LOW (ref 135–145)
Sodium: 132 mEq/L — ABNORMAL LOW (ref 135–145)
Sodium: 132 mEq/L — ABNORMAL LOW (ref 135–145)
Sodium: 129 mEq/L — ABNORMAL LOW (ref 135–145)
Sodium: 131 mEq/L — ABNORMAL LOW (ref 135–145)

## 2012-06-20 LAB — BLOOD GAS, ARTERIAL
Drawn by: 105521
FIO2: 0.21 %
Patient temperature: 98.6
TCO2: 9 mmol/L (ref 0–100)
pCO2 arterial: 21.5 mmHg — ABNORMAL LOW (ref 35.0–45.0)
pH, Arterial: 7.212 — ABNORMAL LOW (ref 7.350–7.450)

## 2012-06-20 LAB — GLUCOSE, CAPILLARY
Glucose-Capillary: 118 mg/dL — ABNORMAL HIGH (ref 70–99)
Glucose-Capillary: 311 mg/dL — ABNORMAL HIGH (ref 70–99)
Glucose-Capillary: 368 mg/dL — ABNORMAL HIGH (ref 70–99)
Glucose-Capillary: 123 mg/dL — ABNORMAL HIGH (ref 70–99)
Glucose-Capillary: 87 mg/dL (ref 70–99)
Glucose-Capillary: 100 mg/dL — ABNORMAL HIGH (ref 70–99)
Glucose-Capillary: 94 mg/dL (ref 70–99)
Glucose-Capillary: 216 mg/dL — ABNORMAL HIGH (ref 70–99)
Glucose-Capillary: 285 mg/dL — ABNORMAL HIGH (ref 70–99)

## 2012-06-20 LAB — COMPREHENSIVE METABOLIC PANEL
AST: 18 U/L (ref 0–37)
Albumin: 3.3 g/dL — ABNORMAL LOW (ref 3.5–5.2)
Alkaline Phosphatase: 102 U/L (ref 39–117)
BUN: 10 mg/dL (ref 6–23)
Potassium: 4.5 mEq/L (ref 3.5–5.1)
Sodium: 134 mEq/L — ABNORMAL LOW (ref 135–145)
Total Protein: 6.5 g/dL (ref 6.0–8.3)

## 2012-06-20 LAB — CBC WITH DIFFERENTIAL/PLATELET
Basophils Absolute: 0 10*3/uL (ref 0.0–0.1)
Eosinophils Absolute: 0 10*3/uL (ref 0.0–0.7)
Eosinophils Relative: 0 % (ref 0–5)
HCT: 32.2 % — ABNORMAL LOW (ref 36.0–46.0)
Lymphocytes Relative: 30 % (ref 12–46)
Lymphs Abs: 1.7 10*3/uL (ref 0.7–4.0)
MCH: 31 pg (ref 26.0–34.0)
MCV: 89.9 fL (ref 78.0–100.0)
Monocytes Absolute: 0.5 10*3/uL (ref 0.1–1.0)
RDW: 14 % (ref 11.5–15.5)
WBC: 5.7 10*3/uL (ref 4.0–10.5)

## 2012-06-20 LAB — URINALYSIS, ROUTINE W REFLEX MICROSCOPIC
Bilirubin Urine: NEGATIVE
Leukocytes, UA: NEGATIVE
Nitrite: NEGATIVE
Specific Gravity, Urine: 1.021 (ref 1.005–1.030)
Urobilinogen, UA: 0.2 mg/dL (ref 0.0–1.0)
pH: 5 (ref 5.0–8.0)

## 2012-06-20 LAB — URINE MICROSCOPIC-ADD ON

## 2012-06-20 LAB — MRSA PCR SCREENING: MRSA by PCR: NEGATIVE

## 2012-06-20 MED ORDER — ONDANSETRON HCL 4 MG/2ML IJ SOLN: 4.0000 mg | Freq: Four times a day (QID) | INTRAMUSCULAR | Status: DC | PRN

## 2012-06-20 MED ORDER — SODIUM CHLORIDE 0.9 % IJ SOLN
INTRAMUSCULAR | Status: AC
  Administered 2012-06-20: 20:00:00
  Filled 2012-06-20: qty 10

## 2012-06-20 MED ORDER — HYDROCODONE-ACETAMINOPHEN 5-325 MG PO TABS
1.0000 | ORAL_TABLET | Freq: Four times a day (QID) | ORAL | Status: DC | PRN
  Administered 2012-06-20 – 2012-06-21 (×2): 1 via ORAL
  Filled 2012-06-20 (×2): qty 1

## 2012-06-20 MED ORDER — QUETIAPINE FUMARATE 25 MG PO TABS
25.0000 mg | ORAL_TABLET | Freq: Every day | ORAL | Status: DC
  Administered 2012-06-20 – 2012-06-21 (×2): 25 mg via ORAL
  Filled 2012-06-20 (×4): qty 1

## 2012-06-20 MED ORDER — DEXTROSE 50 % IV SOLN
25.0000 mL | INTRAVENOUS | Status: DC | PRN
  Administered 2012-06-22 (×2): 25 mL via INTRAVENOUS

## 2012-06-20 MED ORDER — OXYCODONE HCL 5 MG PO TABS: 5.0000 mg | ORAL_TABLET | Freq: Four times a day (QID) | ORAL | Status: DC | PRN

## 2012-06-20 MED ORDER — ONDANSETRON HCL 4 MG/2ML IJ SOLN
INTRAMUSCULAR | Status: AC
  Administered 2012-06-20: 09:00:00
  Filled 2012-06-20: qty 2

## 2012-06-20 MED ORDER — SODIUM CHLORIDE 0.9 % IV SOLN
INTRAVENOUS | Status: DC
  Administered 2012-06-20: 5.7 [IU]/h via INTRAVENOUS
  Administered 2012-06-20: 5.1 [IU]/h via INTRAVENOUS
  Administered 2012-06-20: 5 [IU]/h via INTRAVENOUS
  Administered 2012-06-20: 3.1 [IU]/h via INTRAVENOUS
  Administered 2012-06-20: 6.4 [IU]/h via INTRAVENOUS
  Administered 2012-06-20: 0.4 [IU]/h via INTRAVENOUS
  Administered 2012-06-20: 3.1 [IU]/h via INTRAVENOUS
  Administered 2012-06-20: 4.5 [IU]/h via INTRAVENOUS
  Administered 2012-06-20: 1.9 [IU]/h via INTRAVENOUS
  Administered 2012-06-20: 0.5 [IU]/h via INTRAVENOUS
  Administered 2012-06-20: 4 [IU]/h via INTRAVENOUS
  Filled 2012-06-20 (×3): qty 1

## 2012-06-20 MED ORDER — MORPHINE SULFATE 2 MG/ML IJ SOLN: 1.0000 mg | INTRAMUSCULAR | Status: DC | PRN

## 2012-06-20 MED ORDER — INSULIN ASPART 100 UNIT/ML ~~LOC~~ SOLN: 0.0000 [IU] | SUBCUTANEOUS | Status: DC

## 2012-06-20 MED ORDER — SODIUM CHLORIDE 0.9 % IV SOLN
INTRAVENOUS | Status: DC
  Administered 2012-06-20: 09:00:00 via INTRAVENOUS

## 2012-06-20 MED ORDER — SODIUM CHLORIDE 0.9 % IV SOLN
200.0000 mg | Freq: Two times a day (BID) | INTRAVENOUS | Status: DC
  Administered 2012-06-20: 200 mg via INTRAVENOUS
  Filled 2012-06-20 (×4): qty 4

## 2012-06-20 MED ORDER — LEVOTHYROXINE SODIUM 100 MCG IV SOLR
75.0000 ug | Freq: Every day | INTRAVENOUS | Status: DC
  Administered 2012-06-20: 75 ug via INTRAVENOUS
  Filled 2012-06-20 (×2): qty 5

## 2012-06-20 MED ORDER — DEXTROSE-NACL 5-0.45 % IV SOLN
INTRAVENOUS | Status: DC
  Administered 2012-06-20 (×2): via INTRAVENOUS

## 2012-06-20 MED ORDER — ZOLPIDEM TARTRATE 5 MG PO TABS: 5.0000 mg | ORAL_TABLET | Freq: Every evening | ORAL | Status: DC | PRN

## 2012-06-20 MED ORDER — ASPIRIN EC 81 MG PO TBEC
81.0000 mg | DELAYED_RELEASE_TABLET | Freq: Every day | ORAL | Status: DC
  Filled 2012-06-20: qty 1

## 2012-06-20 MED ORDER — POTASSIUM CHLORIDE 10 MEQ/100ML IV SOLN
10.0000 meq | INTRAVENOUS | Status: AC
  Administered 2012-06-20 (×2): 10 meq via INTRAVENOUS
  Filled 2012-06-20: qty 200

## 2012-06-20 MED ORDER — SODIUM CHLORIDE 0.9 % IV BOLUS (SEPSIS)
500.0000 mL | Freq: Once | INTRAVENOUS | Status: DC
  Administered 2012-06-20: 500 mL via INTRAVENOUS

## 2012-06-20 MED ORDER — HYDROMORPHONE HCL PF 1 MG/ML IJ SOLN: 0.5000 mg | INTRAMUSCULAR | Status: DC | PRN

## 2012-06-20 MED ORDER — LEVOTHYROXINE SODIUM 125 MCG PO TABS
125.0000 ug | ORAL_TABLET | Freq: Every day | ORAL | Status: DC
  Filled 2012-06-20 (×2): qty 1

## 2012-06-20 MED ORDER — SODIUM CHLORIDE 0.9 % IV BOLUS (SEPSIS)
500.0000 mL | Freq: Once | INTRAVENOUS | Status: AC
  Administered 2012-06-20: 500 mL via INTRAVENOUS

## 2012-06-20 MED ORDER — SODIUM CHLORIDE 0.9 % IJ SOLN
INTRAMUSCULAR | Status: AC
  Administered 2012-06-20: 10 mL
  Filled 2012-06-20: qty 20

## 2012-06-20 MED ORDER — PHENYTOIN SODIUM EXTENDED 100 MG PO CAPS
200.0000 mg | ORAL_CAPSULE | Freq: Two times a day (BID) | ORAL | Status: DC
  Administered 2012-06-20: 200 mg via ORAL
  Filled 2012-06-20 (×4): qty 2

## 2012-06-20 MED ORDER — OXYCODONE-ACETAMINOPHEN 5-325 MG PO TABS: 1.0000 | ORAL_TABLET | Freq: Three times a day (TID) | ORAL | Status: DC | PRN

## 2012-06-20 MED ORDER — INSULIN GLARGINE 100 UNIT/ML ~~LOC~~ SOLN: 45.0000 [IU] | Freq: Every day | SUBCUTANEOUS | Status: DC

## 2012-06-20 MED ORDER — ARIPIPRAZOLE 10 MG PO TABS
10.0000 mg | ORAL_TABLET | Freq: Every day | ORAL | Status: DC
  Filled 2012-06-20: qty 1

## 2012-06-20 MED ORDER — SIMVASTATIN 20 MG PO TABS
20.0000 mg | ORAL_TABLET | Freq: Every day | ORAL | Status: DC
  Filled 2012-06-20: qty 1

## 2012-06-20 NOTE — Consult Note (Signed)
PULMONARY  / CRITICAL CARE MEDICINE  Name: Zoe Clay MRN: [id number] DOB: Jul 20, 1975    ADMISSION DATE:  06/19/2012 CONSULTATION DATE:  06/20/12  REFERRING MD :  Dr. Sunnie Nielsen  CHIEF COMPLAINT:  DKA  BRIEF PATIENT DESCRIPTION: 37 y/o AAF, smoker,  with DM-I admitted 3/8 from [facility] with DKA after viral URI.    SIGNIFICANT EVENTS / STUDIES:  3/8 - Admit with DKA   LINES / TUBES:  CULTURES: 3/9 MRSA PCR>>>neg 3/9 UA>>>neg UTI, positive glucose >1000 and ketones 3/9 UC>>> 3/9 BCx2>>>  ANTIBIOTICS:   HISTORY OF PRESENT ILLNESS:  37 y/o AAF with DM-I on lantus only admitted 3/8 from [facility] with DKA after viral URI. 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  She noted on am of 3/8 that her blood sugar was elevated in the 300 range but she felt fine.  She went to work and began having abdominal pain, nausea / vomiting and one episode of diarrhea.  She called EMS and her initial blood sugar was 594.  During transport, her insulin gtt was turned off.  On arrival, it was not restarted.  She had worsening of gap acidosis and PCCM consulted for assistance.   PAST MEDICAL HISTORY :  Past Medical History  Diagnosis Date  . Diabetes mellitus   . Seizures   . Thyroid disease   . Hypertension   . High cholesterol   . Bipolar 1 disorder    Past Surgical History  Procedure Laterality Date  . Tubal ligation    . Ankle surgery     Prior to Admission medications   Medication Sig Start Date End Date Taking? Authorizing Provider  ARIPiprazole (ABILIFY) 10 MG tablet Take 10 mg by mouth daily.     Yes Historical Provider, MD  insulin glargine (LANTUS) 100 UNIT/ML injection Inject 30 Units into the skin daily.   Yes Historical Provider, MD  lisinopril (PRINIVIL,ZESTRIL) 5 MG tablet Take 5 mg by mouth daily.   Yes Historical Provider, MD  phenytoin (DILANTIN) 100 MG ER capsule Take 200 mg by mouth 2 (two) times daily.     Yes Historical Provider, MD  SIMVASTATIN PO Take 1 tablet by mouth daily.    Yes Historical Provider, MD  levothyroxine (SYNTHROID, LEVOTHROID) 125 MCG tablet Take 125 mcg by mouth daily.      Historical Provider, MD  nicotine (NICOTROL) 10 MG inhaler Inhale 2 puffs into the lungs daily as needed. To stop smoking    Historical Provider, MD  QUEtiapine (SEROQUEL) 25 MG tablet Take 25 mg by mouth at bedtime.      Historical Provider, MD   No Known Allergies  FAMILY HISTORY:  Family History  Problem Relation Age of Onset  . Diabetes Father   . Cancer Maternal Grandfather    SOCIAL HISTORY:  reports that she has been smoking Cigarettes.  She has been smoking about 0.50 packs per day. She does not have any smokeless tobacco history on file. She reports that she does not drink alcohol or use illicit drugs.  REVIEW OF SYSTEMS:  All systems reviewed.  Pertinent positives in HPI, otherwise negative.   SUBJECTIVE: Patient denies fevers, chills, cough, n/v/d, urinary symptoms.    VITAL SIGNS: Temp:  [98.2 F (36.8 C)-99.3 F (37.4 C)] 98.7 F (37.1 C) (03/09 0815) Pulse Rate:  [101-116] 107 (03/09 0324) Resp:  [17-30] 26 (03/09 0324) BP: (103-139)/(53-75) 118/73 mmHg (03/09 0324)  SpO2:  [98 %-100 %] 100 % (03/09 0324) Weight:  [133 lb 6.1 oz (60.5 kg)-147 lb (66.679 kg)] 133 lb 6.1 oz (60.5 kg) (03/09 0324)  INTAKE / OUTPUT: Intake/Output     03/08 0701 - 03/09 0700 03/09 0701 - 03/10 0700   I.V. (mL/kg) 941.7 (15.6)    IV Piggyback 58.3    Total Intake(mL/kg) 1000 (16.5)    Urine (mL/kg/hr) 250    Total Output 250     Net +750            PHYSICAL EXAMINATION: General:  Thin adult female in NAD Neuro:  AAOx4 HEENT:  Mm pink/dry Cardiovascular:  s1s2 rrr, no m/r/g Lungs:  resp's even/non-labored, lungs bilaterally clear Abdomen:  Flat/soft, bsx4 active Musculoskeletal:  No acute deformities Skin:  Warm/dry, no edema  LABS:  Recent Labs Lab 06/19/12 1720 06/19/12 1933  06/20/12 0630 06/20/12 0908  HGB 12.0  --  11.1*  --   WBC 4.1  --  5.7  --   PLT 340  --  361  --   NA 131*  --  134*  --   K 5.7*  --  4.5  --   CL 90*  --  101  --   CO2 13*  --  12*  --   GLUCOSE 623*  --  286*  --   BUN 20  --  10  --   CREATININE 0.60  --  0.46*  --   CALCIUM 9.3  --  7.9*  --   AST 25  --  18  --   ALT 16  --  12  --   ALKPHOS 127*  --  102  --   BILITOT 0.2*  --  0.2*  --   PROT 7.6  --  6.5  --   ALBUMIN 3.9  --  3.3*  --   PHART  --  7.253*  --  7.212*  PCO2ART  --  25.4*  --  21.5*  PO2ART  --  104.0*  --  123.0*    Recent Labs Lab 06/20/12 0013 06/20/12 0128 06/20/12 0222 06/20/12 0703 06/20/12 0851  GLUCAP 179* 129* 118* 311* 368*    CXR: 3/8 R IJ in good position, no acute disease  ASSESSMENT / PLAN:  PULMONARY A: Viral URI  P:   -supportive care  CARDIOVASCULAR A:  Sinus Tachycardia - in setting of DKA  P:  -volume replacement  RENAL A:   Hyponatremia Anion gap acidosis - in setting of DKA  P:   -volume resuscitation, correct hyperglycemia  HEMATOLOGIC A:   Anemia -likely chronic disease.   P:  -no evidence bleeding.   -follow h/h  INFECTIOUS A:   Viral URI  P:   -follow cultures   ENDOCRINE A:   DKA - insulin gtt stopped prior to transfer to [facility].  Anion gap not closed prior to stopping.     P:   -follow DKA protocol -insulin gtt restart until AG closed in full and bicarb greater 18 on chemsitry -q4h chem panels - NO role bicarb will prolong resolution acidosis -ABG   Does not need ICu at this stage  Canary Brim, NP-C [facility] Pgr: [phone] or [phone]  I have personally obtained a history, examined the patient, evaluated laboratory and imaging results, formulated the assessment and plan and placed orders.  06/20/2012, 9:26 AM  Mcarthur Rossetti. Tyson Alias, MD, FACP Pgr: [phone] [facility]

## 2012-06-20 NOTE — Progress Notes (Signed)
[facility] PROGRESS NOTE  Zoe Clay UUV:[id number] DOB: Dec 11, 1975 DOA: 06/19/2012 PCP: No primary [facility] on file.  Assessment/Plan: 1. DKA: Gap at 20, bicarb at 46.        Start insulin Gtt stat, IV fluids, stat ABG, B-met Q 2 hours. When Blood sugar decrease below 250 will transition fluids to D 5 %NS. Replete K as needed. No         evidence of infection, chest x ray negative for PNA. Will check UA, culture, blood culture.  2. Encephalopathy: patient with slow respond, fall sleep. This is probably related to acidosis. Stat ABG. Will ask CCM to assess.   Code Status: Full Code.  Family Communication: none at bedside.  Disposition Plan: Remain step down.    Consultants:  None.  Procedures: None.  Antibiotics:    HPI/Subjective: Patient relates resolution of abdominal pain. Denies dysuria, does relates cough.   Objective: Filed Vitals:   06/20/12 0100 06/20/12 0120 06/20/12 0324 06/20/12 0815  BP:  139/75 118/73   Pulse: 101 110 107   Temp:  99.3 F (37.4 C) 98.2 F (36.8 C) 98.7 F (37.1 C)  TempSrc:  Oral Oral Oral  Resp: 27 30 26    Height:   5\' 5"  (1.651 m)   Weight:   60.5 kg (133 lb 6.1 oz)   SpO2: 100% 98% 100%     Intake/Output Summary (Last 24 hours) at 06/20/12 0842 Last data filed at 06/20/12 0636  Gross per 24 hour  Intake   1000 ml  Output    250 ml  Net    750 ml   Filed Weights   06/19/12 1530 06/20/12 0324  Weight: 66.679 kg (147 lb) 60.5 kg (133 lb 6.1 oz)    Exam:   General:  Slow respond, somnolent.   Cardiovascular: S 1, S 2 RRR  Respiratory: CTA  Abdomen: Bs soft, NT, ND.  Musculoskeletal: No edema.   Data Reviewed: Basic Metabolic Panel:  Recent Labs Lab 06/19/12 1720 06/20/12 0630  NA 131* 134*  K 5.7* 4.5  CL 90* 101  CO2 13* 12*  GLUCOSE 623* 286*  BUN 20 10  CREATININE 0.60 0.46*  CALCIUM 9.3 7.9*   Liver Function Tests:  Recent Labs Lab 06/19/12 1720 06/20/12 0630  AST 25 18  ALT 16 12   ALKPHOS 127* 102  BILITOT 0.2* 0.2*  PROT 7.6 6.5  ALBUMIN 3.9 3.3*   No results found for this basename: LIPASE, AMYLASE,  in the last 168 hours No results found for this basename: AMMONIA,  in the last 168 hours CBC:  Recent Labs Lab 06/19/12 1720 06/20/12 0630  WBC 4.1 5.7  NEUTROABS 3.0 3.5  HGB 12.0 11.1*  HCT 35.5* 32.2*  MCV 91.0 89.9  PLT 340 361   Cardiac Enzymes: No results found for this basename: CKTOTAL, CKMB, CKMBINDEX, TROPONINI,  in the last 168 hours BNP (last 3 results) No results found for this basename: PROBNP,  in the last 8760 hours CBG:  Recent Labs Lab 06/19/12 2141 06/20/12 0013 06/20/12 0128 06/20/12 0222 06/20/12 0703  GLUCAP 278* 179* 129* 118* 311*    Recent Results (from the past 240 hour(s))  MRSA PCR SCREENING     Status: None   Collection Time    06/20/12  3:40 AM      Result Value Range Status   MRSA by PCR NEGATIVE  NEGATIVE Final   Comment:  The GeneXpert MRSA Assay (FDA     approved for NASAL specimens     only), is one component of a     comprehensive MRSA colonization     surveillance program. It is not     intended to diagnose MRSA     infection nor to guide or     monitor treatment for     MRSA infections.     Studies: Dg Chest Portable 1 View  06/19/2012  *RADIOLOGY REPORT*  Clinical Data: Status post central line placement  PORTABLE CHEST - 1 VIEW  Comparison: None.  Findings: Right IJ central venous catheter.  The catheter tip projects over the distal SVC.   No evidence of pneumothorax.  Given portable technique, the lungs are clear.  Cardiac and mediastinal contours within normal limits.  No acute osseous abnormality.  IMPRESSION: Right IJ central venous catheter with the tip projecting over the distal SVC.  No evidence of complication or acute cardiopulmonary disease.   Original Report Authenticated By: Malachy Moan, M.D.     Scheduled Meds: . ARIPiprazole  10 mg Oral Daily  . aspirin EC  81 mg  Oral Daily  . levothyroxine  125 mcg Oral QAC breakfast  . lidocaine-EPINEPHrine      . phenytoin  200 mg Oral BID  . potassium chloride  10 mEq Intravenous Q1H  . QUEtiapine  25 mg Oral QHS  . simvastatin  20 mg Oral q1800   Continuous Infusions: . sodium chloride    . dextrose 5 % and 0.45% NaCl Stopped (06/20/12 0836)  . insulin (NOVOLIN-R) infusion      Principal Problem:   DKA (diabetic ketoacidoses) Active Problems:   Nausea with vomiting   Type I (juvenile type) diabetes mellitus with ketoacidosis, uncontrolled   Seizure disorder   Unspecified hypothyroidism   Hyponatremia    Time spent: 35 minutes    REGALADO,BELKYS  [facility] Pager [phone]. If 7PM-7AM, please contact night-coverage at [email or web address], password [facility] 06/20/2012, 8:42 AM  LOS: 1 day

## 2012-06-20 NOTE — Progress Notes (Signed)
  CRITICAL VALUE ALERT  Critical value received:  CO2-8 Date of notification: 06/20/2012 Time of notification: 1019  Critical value read back:yes  Nurse who received alert:  J.W.  MD notified (1st page): Tyson Alias Time of first page: 1020 MD notified (2nd page):  Time of second page:  Responding MD: Tyson Alias Time MD responded:  1021

## 2012-06-20 NOTE — H&P (Signed)
[facility] History and Physical  Zoe Clay ZOX:[id number] DOB: 1975-12-13 DOA: 06/19/2012  Referring physician:  EDP PCP: No primary provider on file.  Specialists:   Chief Complaint: [place] and vomiting and increased blood sugars  HPI: Zoe Clay is a 37 y.o. female with Type 1 DM who presented to the [facility] ED with complaints of nausea and vomiting and diffuse ABD pain along with increased glucose levels.  She denies having fevers or chills or diarrhea.  She has not been able to hold down foods or liquids.  She reports that a virus has been going around where she works.   She was evaluated in the ED and found to have a glucose level of 600, and an Anion Gap of 28, she was placed on the IV Insulin protocol and by arrival or transfer her glucose levels had decreased to the 100s and the glucostablizer was discontinued.      Review of Systems: The patient denies anorexia, fever, chills, weight loss, vision loss, decreased hearing, hoarseness, chest pain, syncope, dyspnea on exertion, peripheral edema, balance deficits, hemoptysis, abdominal pain, diarrhea, hematemesis, melena, hematochezia, severe indigestion/heartburn, dysuria, hematuria, incontinence, muscle weakness, suspicious skin lesions, transient blindness, difficulty walking, depression, unusual weight change, abnormal bleeding, enlarged lymph nodes, angioedema, and breast masses.    Past Medical History  Diagnosis Date  . Diabetes mellitus   . Seizures   . Thyroid disease   . Hypertension   . High cholesterol   . Bipolar 1 disorder       Past Surgical History  Procedure Laterality Date  . Tubal ligation    . Ankle surgery       Medications:  HOME MEDS: Prior to Admission medications   Medication Sig Start Date End Date Taking? Authorizing Provider  ARIPiprazole (ABILIFY) 10 MG tablet Take 10 mg by mouth daily.      Historical Provider, MD  aspirin 81 MG tablet Take 81 mg by mouth daily.      Historical  Provider, MD  insulin aspart (NOVOLOG FLEXPEN) 100 UNIT/ML injection Inject 10-12 Units into the skin 3 (three) times daily. 10 units with breakfast, lunch, and dinner when blood sugar is below 200. 12 units when blood sugar 200 or above.     Historical Provider, MD  insulin glargine (LANTUS SOLOSTAR) 100 UNIT/ML injection Inject 45 Units into the skin at bedtime.     Historical Provider, MD  levothyroxine (SYNTHROID, LEVOTHROID) 125 MCG tablet Take 125 mcg by mouth daily.      Historical Provider, MD  LISINOPRIL PO Take 1 tablet by mouth daily.      Historical Provider, MD  naproxen (NAPROSYN) 375 MG tablet Take 1 tablet (375 mg total) by mouth 2 (two) times daily. 04/02/12   April K Palumbo-Rasch, MD  nicotine (NICOTROL) 10 MG inhaler Inhale 2 puffs into the lungs daily as needed. To stop smoking     Historical Provider, MD  phenytoin (DILANTIN) 100 MG ER capsule Take 200 mg by mouth 2 (two) times daily.     Historical Provider, MD  QUEtiapine (SEROQUEL) 25 MG tablet Take 25 mg by mouth at bedtime.      Historical Provider, MD  SIMVASTATIN PO Take 1 tablet by mouth daily.     Historical Provider, MD  zolpidem (AMBIEN) 10 MG tablet Take 10 mg by mouth at bedtime as needed. For sleep     Historical Provider, MD      Allergies:  No Known Allergies  Social History:   reports that she has been smoking Cigarettes and smokes 0.50 packs per day. She does not have any smokeless tobacco history on file. She reports that she does not drink alcohol or use illicit drugs.    Family History: Family History  Problem Relation Age of Onset  . Diabetes Father   . Cancer Maternal Grandfather       Physical Exam:  GEN:  Pleasant 37 year old thin well developed African American Female examined  and in no acute distress; cooperative with exam Filed Vitals:   06/19/12 2231 06/20/12 0100 06/20/12 0120 06/20/12 0324  BP: 123/70  139/75 118/73  Pulse: 107 101 110 107  Temp:   99.3 F (37.4 C) 98.2  F (36.8 C)  TempSrc:   Oral Oral  Resp: 17 27 30 26   Height:    5\' 5"  (1.651 m)  Weight:    60.5 kg (133 lb 6.1 oz)  SpO2: 100% 100% 98% 100%   Blood pressure 118/73, pulse 107, temperature 98.2 F (36.8 C), temperature source Oral, resp. rate 26, height 5\' 5"  (1.651 m), weight 60.5 kg (133 lb 6.1 oz), last menstrual period 06/19/2012, SpO2 100.00%. PSYCH: SHe is alert and oriented x4; does not appear anxious does not appear depressed; affect is normal HEENT: Normocephalic and Atraumatic, Mucous membranes pink; + Exophthalmos, PERRLA; EOM intact; Fundi:  Benign;  No scleral icterus, Nares: Patent, Oropharynx: Clear,  Fair Dentition, Neck:  FROM, no cervical lymphadenopathy nor thyromegaly or carotid bruit; no JVD; Breasts:: Not examined CHEST WALL: No tenderness CHEST: Normal respiration, clear to auscultation bilaterally HEART: Regular rate and rhythm; no murmurs rubs or gallops BACK: No kyphosis or scoliosis; no CVA tenderness ABDOMEN: Positive Bowel Sounds, Scaphoid, soft non-tender; no masses, no organomegaly.    Rectal Exam: Not done EXTREMITIES: No cyanosis, clubbing or edema; no ulcerations. Genitalia: not examined PULSES: 2+ and symmetric SKIN: Normal hydration no rash or ulceration CNS: Cranial nerves 2-12 grossly intact no focal neurologic deficit   Labs & Imaging Results for orders placed during the hospital encounter of 06/19/12 (from the past 48 hour(s))  GLUCOSE, CAPILLARY     Status: Abnormal   Collection Time    06/19/12  3:32 PM      Result Value Range   Glucose-Capillary >600 (*) 70 - 99 mg/dL  URINALYSIS, ROUTINE W REFLEX MICROSCOPIC     Status: Abnormal   Collection Time    06/19/12  4:33 PM      Result Value Range   Color, Urine YELLOW  YELLOW   APPearance CLEAR  CLEAR   Specific Gravity, Urine 1.026  1.005 - 1.030   pH 5.0  5.0 - 8.0   Glucose, UA >1000 (*) NEGATIVE mg/dL   Hgb urine dipstick NEGATIVE  NEGATIVE   Bilirubin Urine NEGATIVE  NEGATIVE    Ketones, ur >80 (*) NEGATIVE mg/dL   Protein, ur NEGATIVE  NEGATIVE mg/dL   Urobilinogen, UA 0.2  0.0 - 1.0 mg/dL   Nitrite NEGATIVE  NEGATIVE   Leukocytes, UA NEGATIVE  NEGATIVE  URINE MICROSCOPIC-ADD ON     Status: None   Collection Time    06/19/12  4:33 PM      Result Value Range   Urine-Other RARE YEAST    BASIC METABOLIC PANEL     Status: Abnormal   Collection Time    06/19/12  5:20 PM      Result Value Range   Sodium 131 (*) 135 - 145 mEq/L  Potassium 5.7 (*) 3.5 - 5.1 mEq/L   Chloride 90 (*) 96 - 112 mEq/L   CO2 13 (*) 19 - 32 mEq/L   Glucose, Bld 623 (*) 70 - 99 mg/dL   Comment: CRITICAL RESULT CALLED TO, READ BACK BY AND VERIFIED WITH:     MARVA SIMMS,RN ON 06/19/12 AT 1755 BY T.GRAHAM   BUN 20  6 - 23 mg/dL   Creatinine, Ser 1.61  0.50 - 1.10 mg/dL   Calcium 9.3  8.4 - 09.6 mg/dL   GFR calc non Af Amer >90  >90 mL/min   GFR calc Af Amer >90  >90 mL/min   Comment:            The eGFR has been calculated     using the CKD EPI equation.     This calculation has not been     validated in all clinical     situations.     eGFR's persistently     <90 mL/min signify     possible Chronic Kidney Disease.  CBC     Status: Abnormal   Collection Time    06/19/12  5:20 PM      Result Value Range   WBC 4.1  4.0 - 10.5 K/uL   RBC 3.90  3.87 - 5.11 MIL/uL   Hemoglobin 12.0  12.0 - 15.0 g/dL   HCT 04.5 (*) 40.9 - 81.1 %   MCV 91.0  78.0 - 100.0 fL   MCH 30.8  26.0 - 34.0 pg   MCHC 33.8  30.0 - 36.0 g/dL   RDW 91.4  78.2 - 95.6 %   Platelets 340  150 - 400 K/uL  HEPATIC FUNCTION PANEL     Status: Abnormal   Collection Time    06/19/12  5:20 PM      Result Value Range   Total Protein 7.6  6.0 - 8.3 g/dL   Albumin 3.9  3.5 - 5.2 g/dL   AST 25  0 - 37 U/L   ALT 16  0 - 35 U/L   Alkaline Phosphatase 127 (*) 39 - 117 U/L   Total Bilirubin 0.2 (*) 0.3 - 1.2 mg/dL   Bilirubin, Direct 0.1  0.0 - 0.3 mg/dL   Indirect Bilirubin 0.1 (*) 0.3 - 0.9 mg/dL  DIFFERENTIAL      Status: None   Collection Time    06/19/12  5:20 PM      Result Value Range   Neutrophils Relative 73  43 - 77 %   Neutro Abs 3.0  1.7 - 7.7 K/uL   Lymphocytes Relative 18  12 - 46 %   Lymphs Abs 0.7  0.7 - 4.0 K/uL   Monocytes Relative 8  3 - 12 %   Monocytes Absolute 0.3  0.1 - 1.0 K/uL   Eosinophils Relative 0  0 - 5 %   Eosinophils Absolute 0.0  0.0 - 0.7 K/uL   Basophils Relative 1  0 - 1 %   Basophils Absolute 0.0  0.0 - 0.1 K/uL  GLUCOSE, CAPILLARY     Status: Abnormal   Collection Time    06/19/12  6:29 PM      Result Value Range   Glucose-Capillary 422 (*) 70 - 99 mg/dL   Comment 1 Notify RN    GLUCOSE, CAPILLARY     Status: Abnormal   Collection Time    06/19/12  7:32 PM      Result Value Range   Glucose-Capillary 323 (*)  70 - 99 mg/dL   Comment 1 Notify RN     Comment 2 Documented in Chart    POCT I-STAT 3, BLOOD GAS (G3+)     Status: Abnormal   Collection Time    06/19/12  7:33 PM      Result Value Range   pH, Arterial 7.253 (*) 7.350 - 7.450   pCO2 arterial 25.4 (*) 35.0 - 45.0 mmHg   pO2, Arterial 104.0 (*) 80.0 - 100.0 mmHg   Bicarbonate 11.2 (*) 20.0 - 24.0 mEq/L   TCO2 12  0 - 100 mmol/L   O2 Saturation 97.0     Acid-base deficit 14.0 (*) 0.0 - 2.0 mmol/L   Patient temperature 98.6 F     Collection site RADIAL, ALLEN'S TEST ACCEPTABLE     Drawn by RT     Sample type ARTERIAL    GLUCOSE, CAPILLARY     Status: Abnormal   Collection Time    06/19/12  8:22 PM      Result Value Range   Glucose-Capillary 295 (*) 70 - 99 mg/dL   Comment 1 Notify RN     Comment 2 Documented in Chart    GLUCOSE, CAPILLARY     Status: Abnormal   Collection Time    06/19/12  9:41 PM      Result Value Range   Glucose-Capillary 278 (*) 70 - 99 mg/dL   Comment 1 Notify RN     Comment 2 Documented in Chart    GLUCOSE, CAPILLARY     Status: Abnormal   Collection Time    06/20/12 12:13 AM      Result Value Range   Glucose-Capillary 179 (*) 70 - 99 mg/dL  GLUCOSE, CAPILLARY      Status: Abnormal   Collection Time    06/20/12  1:28 AM      Result Value Range   Glucose-Capillary 129 (*) 70 - 99 mg/dL  GLUCOSE, CAPILLARY     Status: Abnormal   Collection Time    06/20/12  2:22 AM      Result Value Range   Glucose-Capillary 118 (*) 70 - 99 mg/dL   Comment 1 Notify RN      Cardiac Enzymes: No results found for this basename: CKTOTAL, CKMB, CKMBINDEX, TROPONINI,  in the last 168 hours  BNP (last 3 results) No results found for this basename: PROBNP,  in the last 8760 hours CBG:  Recent Labs Lab 06/19/12 2022 06/19/12 2141 06/20/12 0013 06/20/12 0128 06/20/12 0222  GLUCAP 295* 278* 179* 129* 118*    Radiological Exams on Admission: Dg Chest Portable 1 View  06/19/2012  *RADIOLOGY REPORT*  Clinical Data: Status post central line placement  PORTABLE CHEST - 1 VIEW  Comparison: None.  Findings: Right IJ central venous catheter.  The catheter tip projects over the distal SVC.   No evidence of pneumothorax.  Given portable technique, the lungs are clear.  Cardiac and mediastinal contours within normal limits.  No acute osseous abnormality.  IMPRESSION: Right IJ central venous catheter with the tip projecting over the distal SVC.  No evidence of complication or acute cardiopulmonary disease.   Original Report Authenticated By: Malachy Moan, M.D.       Assessment/Plan Principal Problem:   DKA (diabetic ketoacidoses) Active Problems:   Nausea with vomiting   Hyponatremia   Type I (juvenile type) diabetes mellitus with ketoacidosis, uncontrolled   Seizure disorder   Unspecified hypothyroidism     1.   DKA-  Placed on the Glucostabilizer/ IV Insulin Protocol for DKA.  IVFs ordered, and transition to SSI coverage and her basal Insulin dosage.    2.   Nausea and Vomiting-  Possible due to Viral etiology but may have been due to DKA.   Antiemetics PRN.     3.   Hyponatremia-  Due to DKA , should correct with hydration.     4.   DM1- on Lantus  Insulin qhs resume.    5.   Seizure Disorder-  Continue phenytoin therapy , check level.    6.   Hypothyroidism-  Continue synthroid therapy, check TSH.    7.   Lovenox for DVT prophylaxis.     8.  Other- Home Medications reconciled.        Code Status:    FULL CODE   Family Communication:  No Family Present Disposition Plan:  Return to Home on Discharge  Time spent: 76 Minutes  Ron Parker [facility] Pager [phone]  If 7PM-7AM, please contact night-coverage [email or web address] Password [facility] 06/20/2012, 5:39 AM

## 2012-06-21 DIAGNOSIS — E1110 Type 2 diabetes mellitus with ketoacidosis without coma: Secondary | ICD-10-CM

## 2012-06-21 LAB — GLUCOSE, CAPILLARY
Glucose-Capillary: 133 mg/dL — ABNORMAL HIGH (ref 70–99)
Glucose-Capillary: 163 mg/dL — ABNORMAL HIGH (ref 70–99)
Glucose-Capillary: 221 mg/dL — ABNORMAL HIGH (ref 70–99)
Glucose-Capillary: 138 mg/dL — ABNORMAL HIGH (ref 70–99)
Glucose-Capillary: 261 mg/dL — ABNORMAL HIGH (ref 70–99)

## 2012-06-21 LAB — BASIC METABOLIC PANEL
BUN: 7 mg/dL (ref 6–23)
CO2: 19 mEq/L (ref 19–32)
CO2: 20 mEq/L (ref 19–32)
Calcium: 7.5 mg/dL — ABNORMAL LOW (ref 8.4–10.5)
Chloride: 104 mEq/L (ref 96–112)
Chloride: 107 mEq/L (ref 96–112)
Creatinine, Ser: 0.37 mg/dL — ABNORMAL LOW (ref 0.50–1.10)
Creatinine, Ser: 0.38 mg/dL — ABNORMAL LOW (ref 0.50–1.10)
Creatinine, Ser: 0.41 mg/dL — ABNORMAL LOW (ref 0.50–1.10)
GFR calc Af Amer: >90 mL/min (ref >90)
GFR calc Af Amer: >90 mL/min (ref >90)
Potassium: 3.4 mEq/L — ABNORMAL LOW (ref 3.5–5.1)
Sodium: 131 mEq/L — ABNORMAL LOW (ref 135–145)

## 2012-06-21 LAB — URINE CULTURE

## 2012-06-21 MED ORDER — PHENYTOIN SODIUM EXTENDED 100 MG PO CAPS
200.0000 mg | ORAL_CAPSULE | Freq: Two times a day (BID) | ORAL | Status: DC
  Administered 2012-06-21 – 2012-06-22 (×3): 200 mg via ORAL
  Filled 2012-06-21 (×5): qty 2

## 2012-06-21 MED ORDER — SODIUM CHLORIDE 0.9 % IJ SOLN
INTRAMUSCULAR | Status: AC
  Administered 2012-06-21: 10 mL
  Filled 2012-06-21: qty 10

## 2012-06-21 MED ORDER — LEVOTHYROXINE SODIUM 125 MCG PO TABS
125.0000 ug | ORAL_TABLET | Freq: Every day | ORAL | Status: DC
  Administered 2012-06-22: 125 ug via ORAL
  Filled 2012-06-21 (×2): qty 1

## 2012-06-21 MED ORDER — POTASSIUM CHLORIDE CRYS ER 20 MEQ PO TBCR
40.0000 meq | EXTENDED_RELEASE_TABLET | Freq: Once | ORAL | Status: AC
  Administered 2012-06-21: 40 meq via ORAL

## 2012-06-21 MED ORDER — INSULIN GLARGINE 100 UNIT/ML ~~LOC~~ SOLN: 30.0000 [IU] | Freq: Every day | SUBCUTANEOUS | Status: DC

## 2012-06-21 MED ORDER — INSULIN GLARGINE 100 UNIT/ML ~~LOC~~ SOLN
20.0000 [IU] | Freq: Every day | SUBCUTANEOUS | Status: DC
  Administered 2012-06-21 (×2): 20 [IU] via SUBCUTANEOUS

## 2012-06-21 MED ORDER — SIMVASTATIN 20 MG PO TABS
20.0000 mg | ORAL_TABLET | Freq: Every evening | ORAL | Status: DC
  Administered 2012-06-21: 20 mg via ORAL
  Filled 2012-06-21 (×2): qty 1

## 2012-06-21 MED ORDER — INSULIN REGULAR HUMAN 100 UNIT/ML IJ SOLN: INTRAMUSCULAR | Status: DC

## 2012-06-21 MED ORDER — POTASSIUM CHLORIDE CRYS ER 20 MEQ PO TBCR
EXTENDED_RELEASE_TABLET | ORAL | Status: AC
  Administered 2012-06-21: 17:00:00
  Filled 2012-06-21: qty 2

## 2012-06-21 MED ORDER — SODIUM CHLORIDE 0.9 % IV SOLN
INTRAVENOUS | Status: DC
  Administered 2012-06-21: 100 mL/h via INTRAVENOUS

## 2012-06-21 MED ORDER — INSULIN ASPART 100 UNIT/ML ~~LOC~~ SOLN
0.0000 [IU] | SUBCUTANEOUS | Status: DC
  Administered 2012-06-21: 2 [IU] via SUBCUTANEOUS
  Administered 2012-06-21: 5 [IU] via SUBCUTANEOUS
  Administered 2012-06-21: 8 [IU] via SUBCUTANEOUS
  Administered 2012-06-22 (×2): 5 [IU] via SUBCUTANEOUS

## 2012-06-21 NOTE — Progress Notes (Signed)
Patient transferred to 5532 from 43. Patient is A&Ox3. Patient is independent. Patient's skin is clean, dry and intact. Patient lives at home alone. Patient oriented to room and unit. Will continue to monitor patient. Zoe Clay

## 2012-06-21 NOTE — Consult Note (Signed)
PULMONARY  / CRITICAL CARE MEDICINE  Name: Zoe Clay MRN: [id number] DOB: 17-Oct-1975    ADMISSION DATE:  06/19/2012 CONSULTATION DATE:  06/20/12  REFERRING MD :  Dr. Sunnie Nielsen  CHIEF COMPLAINT:  DKA  BRIEF PATIENT DESCRIPTION: 37 y/o AAF, smoker,  with DM-I admitted 3/8 from [facility] with DKA after viral URI.    SIGNIFICANT EVENTS / STUDIES:  3/8 - Admit with DKA 3/10- off inuslin drip, gap closed  LINES / TUBES:  CULTURES: 3/9 MRSA PCR>>>neg 3/9 UA>>>neg UTI, positive glucose >1000 and ketones 3/9 UC>>> 3/9 BCx2>>>  ANTIBIOTICS:  SUBJECTIVE:imrpoved  VITAL SIGNS: Temp:  [97.6 F (36.4 C)-98.6 F (37 C)] 98.4 F (36.9 C) (03/10 1230) Pulse Rate:  [77-96] 91 (03/10 1230) Resp:  [0-30] 16 (03/10 1230) BP: (76-109)/(32-71) 106/59 mmHg (03/10 1230) SpO2:  [93 %-100 %] 100 % (03/10 1230)  INTAKE / OUTPUT: Intake/Output     03/09 0701 - 03/10 0700 03/10 0701 - 03/11 0700   P.O. 0 600   I.V. (mL/kg) 2935 (48.5) 10 (0.2)   IV Piggyback 104    Total Intake(mL/kg) 3039 (50.2) 610 (10.1)   Urine (mL/kg/hr) 1050 (0.7) 1100 (2.1)   Total Output 1050 1100   Net +1989 -490        Urine Occurrence 2 x      PHYSICAL EXAMINATION: General:  Thin adult female in NAD Neuro:  AAOx4 HEENT:  Mm pink/dry Cardiovascular:  s1s2 rrr, no m/r/g Lungs:  rno kussmal, CTA Abdomen:  Flat/soft, bsx4 active Musculoskeletal:  No acute deformities Skin:  Warm/dry, no edema  LABS:  Recent Labs Lab 06/19/12 1720 06/19/12 1933 06/20/12 0630  06/20/12 0908  06/20/12 1444 06/20/12 1625  06/20/12 2343 06/21/12 0410 06/21/12 0736  HGB 12.0  --  11.1*  --   --   --   --   --   --   --   --   --   WBC 4.1  --  5.7  --   --   --   --   --   --   --   --   --   PLT 340  --  361  --   --   --   --   --   --   --   --   --   NA 131*  --  134*  < >  --   < > 132*  --   < > 131* 134* 135  K 5.7*  --  4.5  < >  --   < > 4.1  --   < > 3.5 3.9 3.4*  CL 90*  --  101  < >  --   < > 105   --   < > 104 107 107  CO2 13*  --  12*  < >  --   < > 16*  --   < > 19 16* 20  GLUCOSE 623*  --  286*  < >  --   < > 105*  --   < > 132* 227* 183*  BUN 20  --  10  < >  --   < > 9  --   < > 9 7 5*  CREATININE 0.60  --  0.46*  < >  --   < > 0.44*  --   < > 0.41* 0.37* 0.38*  CALCIUM 9.3  --  7.9*  < >  --   < >  7.6*  --   < > 7.6* 7.5* 7.8*  AST 25  --  18  --   --   --   --   --   --   --   --   --   ALT 16  --  12  --   --   --   --   --   --   --   --   --   ALKPHOS 127*  --  102  --   --   --   --   --   --   --   --   --   BILITOT 0.2*  --  0.2*  --   --   --   --   --   --   --   --   --   PROT 7.6  --  6.5  --   --   --   --   --   --   --   --   --   ALBUMIN 3.9  --  3.3*  --   --   --   --   --   --   --   --   --   LATICACIDVEN  --   --   --   --   --   --   --  0.5  --   --   --   --   PHART  --  7.253*  --   --  7.212*  --   --   --   --   --   --   --   PCO2ART  --  25.4*  --   --  21.5*  --   --   --   --   --   --   --   PO2ART  --  104.0*  --   --  123.0*  --   --   --   --   --   --   --   < > = values in this interval not displayed.  Recent Labs Lab 06/21/12 0257 06/21/12 0410 06/21/12 0748 06/21/12 1143 06/21/12 1522  GLUCAP 163* 221* 138* 102* 261*    ENDOCRINE A:   DKA - insulin gtt stopped prior to transfer to [facility].  Anion gap not closed prior to stopping. 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 Tyson Alias, MD, FACP Pgr: [phone] [facility]

## 2012-06-21 NOTE — Progress Notes (Signed)
Patient being transferred to 5500 per MD order, Reort called to [place] Muta, RN. Patient alert and oriented, all vs WNL. Will transfer in wheel chair.

## 2012-06-21 NOTE — Progress Notes (Signed)
[facility] PROGRESS NOTE  Zoe Clay WJX:[id number] DOB: 06-Nov-1975 DOA: 06/19/2012 PCP: No primary provider on file.  Assessment/Plan:  1-DKA, Diabetes type 1: B-met at 23:00 gap at 8, bicarb at 19. She was transition to lantus. Patient was on D5 fluids still this am. I will repeat B-met to make sure gap is close. Stop D 5 IV fluids. Check stat B-met. If gap open will need to start insulin Gtt. If gap close and BP stable will transfer patient to regular bed. UA negative, Chest x ray negative. 2-Encephalopathy: Improved. Patient more awake. This was likely secondary to acidosis. Appreciate CCM evaluation. 3-Hypotension: Likely secondary to decrease volume. Lactic acid at 0.5. BP in the high 90 range.    Code Status: Full Code.  Family Communication: none at bedside.  Disposition Plan: Remain step down.    Consultants:  CCM  Procedures: None.  Antibiotics:  none  HPI/Subjective: Feeling better, more awake this morning. No nausea or abdominal pain.  Objective: Filed Vitals:   06/21/12 0410 06/21/12 0500 06/21/12 0640 06/21/12 0700  BP: 90/59 90/54 86/45  86/47  Pulse: 80 81 94 88  Temp:      TempSrc:      Resp: 24 24 24 21   Height:      Weight:      SpO2: 100% 100% 100% 100%    Intake/Output Summary (Last 24 hours) at 06/21/12 0827 Last data filed at 06/21/12 0700  Gross per 24 hour  Intake 3039.03 ml  Output   1050 ml  Net 1989.03 ml   Filed Weights   06/19/12 1530 06/20/12 0324  Weight: 66.679 kg (147 lb) 60.5 kg (133 lb 6.1 oz)    Exam:   General:  Slow respond, somnolent.   Cardiovascular: S 1, S 2 RRR  Respiratory: CTA  Abdomen: Bs soft, NT, ND.  Musculoskeletal: No edema.   Data Reviewed: Basic Metabolic Panel:  Recent Labs Lab 06/20/12 1745 06/20/12 1945 06/20/12 2135 06/20/12 2343 06/21/12 0410  NA 131* 129* 131* 131* 134*  K 4.1 3.7 3.5 3.5 3.9  CL 104 100 104 104 107  CO2 16* 15* 18* 19 16*  GLUCOSE 166* 316* 232* 132*  227*  BUN 8 9 10 9 7   CREATININE 0.42* 0.42* 0.47* 0.41* 0.37*  CALCIUM 7.5* 7.4* 7.4* 7.6* 7.5*   Liver Function Tests:  Recent Labs Lab 06/19/12 1720 06/20/12 0630  AST 25 18  ALT 16 12  ALKPHOS 127* 102  BILITOT 0.2* 0.2*  PROT 7.6 6.5  ALBUMIN 3.9 3.3*   No results found for this basename: LIPASE, AMYLASE,  in the last 168 hours No results found for this basename: AMMONIA,  in the last 168 hours CBC:  Recent Labs Lab 06/19/12 1720 06/20/12 0630  WBC 4.1 5.7  NEUTROABS 3.0 3.5  HGB 12.0 11.1*  HCT 35.5* 32.2*  MCV 91.0 89.9  PLT 340 361   Cardiac Enzymes: No results found for this basename: CKTOTAL, CKMB, CKMBINDEX, TROPONINI,  in the last 168 hours BNP (last 3 results) No results found for this basename: PROBNP,  in the last 8760 hours CBG:  Recent Labs Lab 06/21/12 0045 06/21/12 0149 06/21/12 0257 06/21/12 0410 06/21/12 0748  GLUCAP 129* 133* 163* 221* 138*    Recent Results (from the past 240 hour(s))  MRSA PCR SCREENING     Status: None   Collection Time    06/20/12  3:40 AM      Result Value Range Status   MRSA by  PCR NEGATIVE  NEGATIVE Final   Comment:            The GeneXpert MRSA Assay (FDA     approved for NASAL specimens     only), is one component of a     comprehensive MRSA colonization     surveillance program. It is not     intended to diagnose MRSA     infection nor to guide or     monitor treatment for     MRSA infections.     Studies: Dg Chest Portable 1 View  06/19/2012  *RADIOLOGY REPORT*  Clinical Data: Status post central line placement  PORTABLE CHEST - 1 VIEW  Comparison: None.  Findings: Right IJ central venous catheter.  The catheter tip projects over the distal SVC.   No evidence of pneumothorax.  Given portable technique, the lungs are clear.  Cardiac and mediastinal contours within normal limits.  No acute osseous abnormality.  IMPRESSION: Right IJ central venous catheter with the tip projecting over the distal SVC.   No evidence of complication or acute cardiopulmonary disease.   Original Report Authenticated By: Malachy Moan, M.D.     Scheduled Meds: . insulin aspart  0-15 Units Subcutaneous Q4H  . insulin glargine  20 Units Subcutaneous QHS  . levothyroxine  75 mcg Intravenous Daily  . phenytoin (DILANTIN) IV  200 mg Intravenous Q12H  . QUEtiapine  25 mg Oral QHS  . sodium chloride       Continuous Infusions: . insulin (NOVOLIN-R) infusion Stopped (06/21/12 0150)    Principal Problem:   DKA (diabetic ketoacidoses) Active Problems:   Nausea with vomiting   Type I (juvenile type) diabetes mellitus with ketoacidosis, uncontrolled   Seizure disorder   Unspecified hypothyroidism   Hyponatremia    Time spent: 35 minutes    Atif Chapple  [facility] Pager [phone]. If 7PM-7AM, please contact night-coverage at [email or web address], password [facility] 06/21/2012, 8:27 AM  LOS: 2 days

## 2012-06-21 NOTE — Progress Notes (Signed)
Inpatient Diabetes Program Recommendations   AACE/ADA: New Consensus Statement on Inpatient Glycemic Control (2013)   Target Ranges: Prepandial: less than 140 mg/dL  Peak postprandial: less than 180 mg/dL (1-2 hours)  Critically ill patients: 140 - 180 mg/dL     Patient admitted with DKA. Glucose 623 mg/dl on admission.   Patient transitioned off IV insulin drip this morning to Lantus and Novolog.   Per home med rec, patient takes the following insulin at home:  Lantus 45 units QHS  Novolog 10 units tid with meals (if CBG <200 mg/dl)/ 12 units tid with meals (if CBG >200 mg/dl)  Z6X 09.6% this admission (06/21/12). Poorly controlled at home.   Discussed A1C results with patient and explained what an A1C is and what it measures. Also discussed the importance of checking CBGs and maintaining good CBG control to prevent long-term and short-term complications. Patient told me she sees a physician at the [facility] in [facility]. Patient told me that she has tried to call the clinic to ask for help in adjusting her insulin to get better control of her CBGs. Patient told me no one from the clinic will call her back. Patient then told me she has a scheduled appointment at the Clinic on Wednesday, March 12 at [facility] for diabetes follow-up. Patient stated she checks her CBGs tid when she is at home but can only check bid when she is at work. Encouraged patient to continue to regularly check her CBGs and also encouraged patient to take her CBG meter to her next PCP appointment. Reminded patient that the physician needs to see her CBG data in order to better adjust her insulin. Patient agreeable. Also encouraged patient to take her A1c results with her as well to the MD appointment (gave patient a copy of her results with a written explanation).   If patient is not discharged tomorrow, someone from the hospital will need to call the Clinic to help her reschedule an appointment.    MD- Please change Novolog Moderate correction scale (SSI) to tid ac + HS (currently ordered Q4 hours).    Will follow.  Ambrose Finland RN, MSN, CDE  Diabetes Coordinator  Inpatient Diabetes Program  [phone]

## 2012-06-22 LAB — GLUCOSE, CAPILLARY
Glucose-Capillary: 131 mg/dL — ABNORMAL HIGH (ref 70–99)
Glucose-Capillary: 148 mg/dL — ABNORMAL HIGH (ref 70–99)
Glucose-Capillary: 201 mg/dL — ABNORMAL HIGH (ref 70–99)
Glucose-Capillary: 229 mg/dL — ABNORMAL HIGH (ref 70–99)
Glucose-Capillary: 56 mg/dL — ABNORMAL LOW (ref 70–99)
Glucose-Capillary: 61 mg/dL — ABNORMAL LOW (ref 70–99)

## 2012-06-22 LAB — BASIC METABOLIC PANEL
BUN: 7 mg/dL (ref 6–23)
CO2: 23 mEq/L (ref 19–32)
Calcium: 8.1 mg/dL — ABNORMAL LOW (ref 8.4–10.5)
Chloride: 107 mEq/L (ref 96–112)
Creatinine, Ser: 0.44 mg/dL — ABNORMAL LOW (ref 0.50–1.10)
GFR calc Af Amer: >90 mL/min (ref >90)
GFR calc non Af Amer: >90 mL/min (ref >90)
Glucose, Bld: 83 mg/dL (ref 70–99)
Potassium: 3.6 mEq/L (ref 3.5–5.1)
Sodium: 138 mEq/L (ref 135–145)

## 2012-06-22 MED ORDER — SODIUM CHLORIDE 0.9 % IJ SOLN
10.0000 mL | INTRAMUSCULAR | Status: DC | PRN
  Administered 2012-06-22: 10 mL

## 2012-06-22 MED ORDER — INSULIN GLARGINE 100 UNIT/ML ~~LOC~~ SOLN: 25.0000 [IU] | Freq: Every day | SUBCUTANEOUS | Status: DC

## 2012-06-22 MED ORDER — DEXTROSE 50 % IV SOLN
INTRAVENOUS | Status: AC
  Filled 2012-06-22: qty 50

## 2012-06-22 MED ORDER — INSULIN GLARGINE 100 UNIT/ML ~~LOC~~ SOLN: 15.0000 [IU] | Freq: Every day | SUBCUTANEOUS | Status: DC

## 2012-06-22 MED ORDER — INSULIN ASPART 100 UNIT/ML ~~LOC~~ SOLN: 0.0000 [IU] | Freq: Three times a day (TID) | SUBCUTANEOUS | Status: DC

## 2012-06-22 MED ORDER — DEXTROSE 50 % IV SOLN: 25.0000 mL | Freq: Once | INTRAVENOUS | Status: AC | PRN

## 2012-06-22 MED ORDER — CIPROFLOXACIN HCL 500 MG PO TABS: 500.0000 mg | ORAL_TABLET | Freq: Two times a day (BID) | ORAL | Status: DC

## 2012-06-22 NOTE — Progress Notes (Signed)
1330 Discharge instructions reviewed with patient and mother verbalize and understands. Prescriptions given to patient. Skin WNL

## 2012-06-22 NOTE — Care Management Note (Signed)
    Page 1 of 1   06/22/2012     3:49:17 PM   CARE MANAGEMENT NOTE 06/22/2012  Patient:  Zoe Clay, Zoe Clay   Account Number:  [id number]  Date Initiated:  06/22/2012  Documentation initiated by:  Letha Cape  Subjective/Objective Assessment:   dx dka  admit- lives with children.  pta indep.     Action/Plan:   Anticipated DC Date:  06/22/2012   Anticipated DC Plan:  HOME/SELF CARE      DC Planning Services  CM consult  [facility]      Choice offered to / List presented to:             Status of service:  Completed, signed off Medicare Important Message given?   (If response is "NO", the following Medicare IM given date fields will be blank) Date Medicare IM given:   Date Additional Medicare IM given:    Discharge Disposition:  HOME/SELF CARE  Per UR Regulation:  Reviewed for med. necessity/level of care/duration of stay  If discussed at Long Length of Stay Meetings, dates discussed:    Comments:  06/22/12 15:47 Letha Cape RN, BSN [phone] patient lives with her small children.  Pta indep.  Patient states she has transportation at discharge and she needs help getting her medications.  Assisted patient with the [facility] , instructed her to go to our outpt pharmacy to pick of her meds.  No other needs idelntified.

## 2012-06-22 NOTE — Progress Notes (Signed)
Hypoglycemic Event  CBG:56 at 0402  Treatment: 15 GM carbohydrate snack  Symptoms: None  Follow-up CBG: Time:0419 CBG Result:58  Possible Reasons for Event: Unknown    Zoe Clay  Remember to initiate Hypoglycemia Order Set & complete

## 2012-06-22 NOTE — Progress Notes (Signed)
1450 Patient discharged to home per order. Left floor with staff and mother. Patient ate 90% of lunch today.

## 2012-06-22 NOTE — Progress Notes (Signed)
Hypoglycemic Event  CBG: 61 at 0340  Treatment: 15 GM carbohydrate snack  Symptoms: None  Follow-up CBG: Time:0402 CBG Result:56  Possible Reasons for Event: Unknown      Zoe Clay  Remember to initiate Hypoglycemia Order Set & complete

## 2012-06-22 NOTE — Discharge Summary (Addendum)
Physician Discharge Summary  Zoe Clay:[id number] DOB: Aug 28, 1975 DOA: 06/19/2012  PCP: No primary provider on file.  Admit date: 06/19/2012 Discharge date: 06/22/2012  Time spent: 30 minutes  Recommendations for Outpatient Follow-up:  1. Follow up with PCP for further adjustment of insulin regimen.   Discharge Diagnoses:    DKA (diabetic ketoacidoses)   Hypoglycemia   Nausea with vomiting   Type I (juvenile type) diabetes mellitus with ketoacidosis, uncontrolled   Seizure disorder   Unspecified hypothyroidism   Hyponatremia   UTI.  Discharge Condition: Stable.   Diet recommendation: Diabetic Diet  Filed Weights   06/19/12 1530 06/20/12 0324 06/21/12 1942  Weight: 66.679 kg (147 lb) 60.5 kg (133 lb 6.1 oz) 63.7 kg (140 lb 6.9 oz)    History of present illness:  Zoe Clay is a 37 y.o. female with Type 1 DM who presented to the [facility] ED with complaints of nausea and vomiting and diffuse ABD pain along with increased glucose levels. She denies having fevers or chills or diarrhea. She has not been able to hold down foods or liquids. She reports that a virus has been going around where she works. She was evaluated in the ED and found to have a glucose level of 600, and an Anion Gap of 28, she was placed on the IV Insulin protocol and by arrival or transfer her glucose levels had decreased to the 100s and the glucostablizer was discontinued.    Hospital Course:  1-DKA, Diabetes type 1:Patient admitted with DKA. She was started on insulin Gtt, IV fluids. Her gap close and she was transition to lantus. She had some hypoglycemia episodes. She will be discharge on 25 units of lantus. Her HBA1c was at 13. Patient will follow up with PCP tomorrow for further adjustment of insulin. She is considering insulin Pump. If she doesn't get insulin pump she will probably need added meal coverage.  2-Encephalopathy: Resolved with correction of acidosis.This was likely secondary to acidosis.  Appreciate CCM evaluation.   3-Hypotension: Likely secondary to decrease volume. Lactic acid at 0.5. BP in the high 90 range.  4-UTI: UA with 100,000 morphotype. Will discharge her with 3 days ciprofloxacin.  5-Hypoglycemia: patient had hypoglycemia. She received novolog moderate scale this am. Her blood sugar increase to 126. She will be discharge lower dose lantus. She will monitor blood sugar at home. She does not want to stay overnight.  Procedures:  None  Consultations:  CCM  Discharge Exam: Filed Vitals:   06/21/12 1230 06/21/12 1600 06/21/12 1942 06/22/12 0449  BP: 106/59 97/58 96/67  100/66  Pulse: 91 90 92 88  Temp: 98.4 F (36.9 C) 98.8 F (37.1 C) 98.5 F (36.9 C) 97.8 F (36.6 C)  TempSrc: Oral Oral Oral Oral  Resp: 16 22 22    Height:   5\' 5"  (1.651 m)   Weight:   63.7 kg (140 lb 6.9 oz)   SpO2: 100% 99% 100% 100%    General: no distress. Cardiovascular: S 1, S 2 RRR Respiratory: CTA  Discharge Instructions  Discharge Orders   Future Orders Complete By Expires     Diet Carb Modified  As directed     Increase activity slowly  As directed         Medication List    STOP taking these medications       lisinopril 2.5 MG tablet  Commonly known as:  PRINIVIL,ZESTRIL      TAKE these medications       ARIPiprazole  10 MG tablet  Commonly known as:  ABILIFY  Take 10 mg by mouth daily.     insulin glargine 100 UNIT/ML injection  Commonly known as:  LANTUS  Inject 25 Units into the skin daily.     levothyroxine 125 MCG tablet  Commonly known as:  SYNTHROID, LEVOTHROID  Take 125 mcg by mouth daily.     nicotine 10 MG inhaler  Commonly known as:  NICOTROL  Inhale 2 puffs into the lungs daily as needed. To stop smoking     phenytoin 100 MG ER capsule  Commonly known as:  DILANTIN  Take 200 mg by mouth 2 (two) times daily.     QUEtiapine 25 MG tablet  Commonly known as:  SEROQUEL  Take 25 mg by mouth at bedtime.     simvastatin 20 MG tablet   Commonly known as:  ZOCOR  Take 20 mg by mouth every evening.       Ciprofloxacin 500 mg BID for 3 days.    The results of significant diagnostics from this hospitalization (including imaging, microbiology, ancillary and laboratory) are listed below for reference.    Significant Diagnostic Studies: Dg Chest Portable 1 View  06/19/2012  *RADIOLOGY REPORT*  Clinical Data: Status post central line placement  PORTABLE CHEST - 1 VIEW  Comparison: None.  Findings: Right IJ central venous catheter.  The catheter tip projects over the distal SVC.   No evidence of pneumothorax.  Given portable technique, the lungs are clear.  Cardiac and mediastinal contours within normal limits.  No acute osseous abnormality.  IMPRESSION: Right IJ central venous catheter with the tip projecting over the distal SVC.  No evidence of complication or acute cardiopulmonary disease.   Original Report Authenticated By: Malachy Moan, M.D.     Microbiology: Recent Results (from the past 240 hour(s))  MRSA PCR SCREENING     Status: None   Collection Time    06/20/12  3:40 AM      Result Value Range Status   MRSA by PCR NEGATIVE  NEGATIVE Final   Comment:            The GeneXpert MRSA Assay (FDA     approved for NASAL specimens     only), is one component of a     comprehensive MRSA colonization     surveillance program. It is not     intended to diagnose MRSA     infection nor to guide or     monitor treatment for     MRSA infections.  CULTURE, BLOOD (ROUTINE X 2)     Status: None   Collection Time    06/20/12 10:35 AM      Result Value Range Status   Specimen Description BLOOD LEFT ARM   Final   Special Requests BOTTLES DRAWN AEROBIC AND ANAEROBIC 10CC   Final   Culture  Setup Time 06/20/2012 16:59   Final   Culture     Final   Value:        BLOOD CULTURE RECEIVED NO GROWTH TO DATE CULTURE WILL BE HELD FOR 5 DAYS BEFORE ISSUING A FINAL NEGATIVE REPORT   Report Status PENDING   Incomplete  CULTURE, BLOOD  (ROUTINE X 2)     Status: None   Collection Time    06/20/12 10:40 AM      Result Value Range Status   Specimen Description BLOOD RIGHT ARM   Final   Special Requests BOTTLES DRAWN AEROBIC ONLY 3CC  Final   Culture  Setup Time 06/20/2012 17:00   Final   Culture     Final   Value:        BLOOD CULTURE RECEIVED NO GROWTH TO DATE CULTURE WILL BE HELD FOR 5 DAYS BEFORE ISSUING A FINAL NEGATIVE REPORT   Report Status PENDING   Incomplete  URINE CULTURE     Status: None   Collection Time    06/20/12 10:59 AM      Result Value Range Status   Specimen Description URINE, RANDOM   Final   Special Requests NONE   Final   Culture  Setup Time 06/20/2012 17:39   Final   Colony Count >=100,000 COLONIES/ML   Final   Culture     Final   Value: Multiple bacterial morphotypes present, none predominant. Suggest appropriate recollection if clinically indicated.   Report Status 06/21/2012 FINAL   Final     Labs: Basic Metabolic Panel:  Recent Labs Lab 06/20/12 1945 06/20/12 2135 06/20/12 2343 06/21/12 0410 06/21/12 0736  NA 129* 131* 131* 134* 135  K 3.7 3.5 3.5 3.9 3.4*  CL 100 104 104 107 107  CO2 15* 18* 19 16* 20  GLUCOSE 316* 232* 132* 227* 183*  BUN 9 10 9 7  5*  CREATININE 0.42* 0.47* 0.41* 0.37* 0.38*  CALCIUM 7.4* 7.4* 7.6* 7.5* 7.8*   Liver Function Tests:  Recent Labs Lab 06/19/12 1720 06/20/12 0630  AST 25 18  ALT 16 12  ALKPHOS 127* 102  BILITOT 0.2* 0.2*  PROT 7.6 6.5  ALBUMIN 3.9 3.3*   No results found for this basename: LIPASE, AMYLASE,  in the last 168 hours No results found for this basename: AMMONIA,  in the last 168 hours CBC:  Recent Labs Lab 06/19/12 1720 06/20/12 0630  WBC 4.1 5.7  NEUTROABS 3.0 3.5  HGB 12.0 11.1*  HCT 35.5* 32.2*  MCV 91.0 89.9  PLT 340 361   Cardiac Enzymes: No results found for this basename: CKTOTAL, CKMB, CKMBINDEX, TROPONINI,  in the last 168 hours BNP: BNP (last 3 results) No results found for this basename: PROBNP,   in the last 8760 hours CBG:  Recent Labs Lab 06/22/12 0419 06/22/12 0422 06/22/12 0446 06/22/12 0505 06/22/12 0756  GLUCAP 58* 55* 131* 148* 201*       Signed:  REGALADO,BELKYS  [facility] 06/22/2012, 9:39 AM

## 2012-06-22 NOTE — Progress Notes (Signed)
1200 Dr.Regalado made aware of hypoglycemic episode.

## 2012-06-22 NOTE — Progress Notes (Signed)
Patient given instructions or post central line removal site via teach back method.  Cothren, Lajean Manes

## 2012-06-22 NOTE — Progress Notes (Signed)
Hypoglycemic Event  CBG:58 at 0419  Treatment: D50 IV 25 mL  Symptoms: None  Follow-up CBG: Time:0446 CBG Result:131  Possible Reasons for Event: Unknown  Comments/MD notified: Notified Craige Cotta, NP by text page. Craige Cotta, NP entered order to decrease Lantus 15 units qhs. Will continue to monitor patient.     Gregor Hams  Remember to initiate Hypoglycemia Order Set & complete

## 2012-06-22 NOTE — Progress Notes (Addendum)
Inpatient Diabetes Program Recommendations  AACE/ADA: New Consensus Statement on Inpatient Glycemic Control (2013)  Target Ranges:  Prepandial:   less than 140 mg/dL      Peak postprandial:   less than 180 mg/dL (1-2 hours)      Critically ill patients:  140 - 180 mg/dL    Results for OZELLA, COMINS (MRN [id number]) as of 06/22/2012 09:32  Ref. Range 06/21/2012 07:48 06/21/2012 11:43 06/21/2012 15:22 06/21/2012 19:45 06/21/2012 21:48  Glucose-Capillary Latest Range: 70-99 mg/dL 469 (H) 629 (H) 528 (H) 99 166 (H)   Results for INOLA, LISLE (MRN [id number]) as of 06/22/2012 09:32  Ref. Range 06/21/2012 23:59 06/22/2012 03:40 06/22/2012 04:02 06/22/2012 04:19 06/22/2012 04:22 06/22/2012 04:46 06/22/2012 05:05 06/22/2012 07:56  Glucose-Capillary Latest Range: 70-99 mg/dL 272 (H) 61 (L) 56 (L) 58 (L) 55 (L) 131 (H) 148 (H) 201 (H)   Noted patient experienced hypoglycemia at 3:40 AM today.  This may have been due to the fact that she may have had some overlap in her Lantus doses yesterday.  Patient received 20 units Lantus yesterday at 2am and another 20 units of Lantus last night at 10pm.    Noted Lantus decreased to 15 units QHS.  Will get lower dose tonight.  Noted patient takes a fairly large dose of Lantus at home (Lantus 45 units QHS).  Based on her body weight, this home dose of Lantus may be too much for her.  May want to discharge patient home on a lower dose of Lantus with follow-up with her PCP at the [facility] in [facility].  Patient's current weight is: 63.7 kg 63.7 kg x 0.6 units/kg= 38 units total daily dose 1/2 basal= 19 units Lantus QHS 1/2 bolus= 6 units Novolog tid with meals  Recommend the following adjustments to patient's in-hospital regimen: 1. Decrease SSI to Novolog Sensitive scale tid ac + HS (currently ordered as Moderate Q4 hours) 2. Add Novolog 4 units meal coverage tid with meals  After patient receives Lantus 15 units tonight, can  re-evaluate her fasting glucose tomorrow.  If her fasting glucose is elevated, we can further increase her Lantus dose.   Spoke with patient yesterday regarding her elevated A1c of 13.2% (see Diabetes Coordinator note from 03/10).  Will follow. Ambrose Finland RN, MSN, CDE Diabetes Coordinator Inpatient Diabetes Program [phone]

## 2012-06-23 LAB — HIV ANTIBODY (ROUTINE TESTING W REFLEX): HIV: NONREACTIVE

## 2012-06-28 LAB — CULTURE, BLOOD (ROUTINE X 2)
Culture: NO GROWTH
Culture: NO GROWTH

## 2012-08-18 ENCOUNTER — Emergency Department (HOSPITAL_BASED_OUTPATIENT_CLINIC_OR_DEPARTMENT_OTHER): Payer: Self-pay

## 2012-08-18 ENCOUNTER — Emergency Department (HOSPITAL_BASED_OUTPATIENT_CLINIC_OR_DEPARTMENT_OTHER)
Admission: EM | Admit: 2012-08-18 | Discharge: 2012-08-18 | Disposition: A | Discharge status: Home / Self Care | Payer: Self-pay | Attending: Emergency Medicine | Admitting: Emergency Medicine

## 2012-08-18 ENCOUNTER — Encounter (HOSPITAL_BASED_OUTPATIENT_CLINIC_OR_DEPARTMENT_OTHER): Payer: Self-pay | Admitting: *Deleted

## 2012-08-18 DIAGNOSIS — T7491XA Unspecified adult maltreatment, confirmed, initial encounter: Secondary | ICD-10-CM | POA: Insufficient documentation

## 2012-08-18 DIAGNOSIS — F17200 Nicotine dependence, unspecified, uncomplicated: Secondary | ICD-10-CM | POA: Insufficient documentation

## 2012-08-18 DIAGNOSIS — I10 Essential (primary) hypertension: Secondary | ICD-10-CM | POA: Insufficient documentation

## 2012-08-18 DIAGNOSIS — E119 Type 2 diabetes mellitus without complications: Secondary | ICD-10-CM | POA: Insufficient documentation

## 2012-08-18 DIAGNOSIS — S20219A Contusion of unspecified front wall of thorax, initial encounter: Secondary | ICD-10-CM

## 2012-08-18 DIAGNOSIS — S335XXA Sprain of ligaments of lumbar spine, initial encounter: Secondary | ICD-10-CM | POA: Insufficient documentation

## 2012-08-18 DIAGNOSIS — E079 Disorder of thyroid, unspecified: Secondary | ICD-10-CM | POA: Insufficient documentation

## 2012-08-18 DIAGNOSIS — IMO0002 Reserved for concepts with insufficient information to code with codable children: Secondary | ICD-10-CM | POA: Insufficient documentation

## 2012-08-18 DIAGNOSIS — S0003XA Contusion of scalp, initial encounter: Secondary | ICD-10-CM | POA: Insufficient documentation

## 2012-08-18 DIAGNOSIS — T7492XA Unspecified child maltreatment, confirmed, initial encounter: Secondary | ICD-10-CM | POA: Insufficient documentation

## 2012-08-18 DIAGNOSIS — S0093XA Contusion of unspecified part of head, initial encounter: Secondary | ICD-10-CM

## 2012-08-18 DIAGNOSIS — G40909 Epilepsy, unspecified, not intractable, without status epilepticus: Secondary | ICD-10-CM | POA: Insufficient documentation

## 2012-08-18 DIAGNOSIS — S1093XA Contusion of unspecified part of neck, initial encounter: Secondary | ICD-10-CM | POA: Insufficient documentation

## 2012-08-18 DIAGNOSIS — F319 Bipolar disorder, unspecified: Secondary | ICD-10-CM | POA: Insufficient documentation

## 2012-08-18 DIAGNOSIS — Z79899 Other long term (current) drug therapy: Secondary | ICD-10-CM | POA: Insufficient documentation

## 2012-08-18 DIAGNOSIS — S39012A Strain of muscle, fascia and tendon of lower back, initial encounter: Secondary | ICD-10-CM

## 2012-08-18 DIAGNOSIS — Z794 Long term (current) use of insulin: Secondary | ICD-10-CM | POA: Insufficient documentation

## 2012-08-18 DIAGNOSIS — E7800 Pure hypercholesterolemia, unspecified: Secondary | ICD-10-CM | POA: Insufficient documentation
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
## 2012-08-18 MED ORDER — HYDROCODONE-ACETAMINOPHEN 5-325 MG PO TABS
2.0000 | ORAL_TABLET | Freq: Once | ORAL | Status: DC
  Filled 2012-08-18: qty 2

## 2012-08-18 MED ORDER — ACETAMINOPHEN 325 MG PO TABS
650.0000 mg | ORAL_TABLET | Freq: Once | ORAL | Status: AC
  Administered 2012-08-18: 650 mg via ORAL
  Filled 2012-08-18: qty 2

## 2012-08-18 MED ORDER — HYDROCODONE-ACETAMINOPHEN 5-325 MG PO TABS: 2.0000 | ORAL_TABLET | ORAL | Status: DC | PRN
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
## 2012-08-18 NOTE — ED Notes (Signed)
Patient transported to X-ray 

## 2012-08-18 NOTE — ED Notes (Signed)
MD at bedside. 
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
## 2012-08-18 NOTE — ED Notes (Signed)
Call placed to pt to inform that she has rx at front desk for pt to pick up.

## 2012-08-20 ENCOUNTER — Emergency Department (HOSPITAL_BASED_OUTPATIENT_CLINIC_OR_DEPARTMENT_OTHER)
Admission: EM | Admit: 2012-08-20 | Discharge: 2012-08-20 | Disposition: A | Discharge status: Home / Self Care | Payer: Self-pay | Attending: Emergency Medicine | Admitting: Emergency Medicine

## 2012-08-20 ENCOUNTER — Encounter (HOSPITAL_BASED_OUTPATIENT_CLINIC_OR_DEPARTMENT_OTHER): Payer: Self-pay | Admitting: *Deleted

## 2012-08-20 DIAGNOSIS — Z792 Long term (current) use of antibiotics: Secondary | ICD-10-CM | POA: Insufficient documentation

## 2012-08-20 DIAGNOSIS — G40909 Epilepsy, unspecified, not intractable, without status epilepticus: Secondary | ICD-10-CM | POA: Insufficient documentation

## 2012-08-20 DIAGNOSIS — F17200 Nicotine dependence, unspecified, uncomplicated: Secondary | ICD-10-CM | POA: Insufficient documentation

## 2012-08-20 DIAGNOSIS — E7800 Pure hypercholesterolemia, unspecified: Secondary | ICD-10-CM | POA: Insufficient documentation

## 2012-08-20 DIAGNOSIS — F319 Bipolar disorder, unspecified: Secondary | ICD-10-CM | POA: Insufficient documentation

## 2012-08-20 DIAGNOSIS — Z794 Long term (current) use of insulin: Secondary | ICD-10-CM | POA: Insufficient documentation

## 2012-08-20 DIAGNOSIS — Z79899 Other long term (current) drug therapy: Secondary | ICD-10-CM | POA: Insufficient documentation

## 2012-08-20 DIAGNOSIS — E119 Type 2 diabetes mellitus without complications: Secondary | ICD-10-CM | POA: Insufficient documentation

## 2012-08-20 DIAGNOSIS — E079 Disorder of thyroid, unspecified: Secondary | ICD-10-CM | POA: Insufficient documentation

## 2012-08-20 DIAGNOSIS — T07XXXA Unspecified multiple injuries, initial encounter: Secondary | ICD-10-CM | POA: Insufficient documentation

## 2012-08-20 DIAGNOSIS — I10 Essential (primary) hypertension: Secondary | ICD-10-CM | POA: Insufficient documentation

## 2012-08-20 MED ORDER — OXYCODONE-ACETAMINOPHEN 5-325 MG PO TABS: 2.0000 | ORAL_TABLET | ORAL | Status: DC | PRN
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
## 2012-08-21 NOTE — ED Provider Notes (Signed)
Medical screening examination/treatment/procedure(s) were performed by non-physician practitioner and as supervising physician I was immediately available for consultation/collaboration.    Gilda Crease, MD 08/21/12 [phone]

## 2012-11-06 ENCOUNTER — Emergency Department (HOSPITAL_BASED_OUTPATIENT_CLINIC_OR_DEPARTMENT_OTHER)
Admission: EM | Admit: 2012-11-06 | Discharge: 2012-11-06 | Disposition: A | Discharge status: Home / Self Care | Payer: Self-pay | Attending: Emergency Medicine | Admitting: Emergency Medicine

## 2012-11-06 ENCOUNTER — Encounter (HOSPITAL_BASED_OUTPATIENT_CLINIC_OR_DEPARTMENT_OTHER): Payer: Self-pay | Admitting: Emergency Medicine

## 2012-11-06 DIAGNOSIS — R109 Unspecified abdominal pain: Secondary | ICD-10-CM | POA: Insufficient documentation

## 2012-11-06 DIAGNOSIS — M79605 Pain in left leg: Secondary | ICD-10-CM

## 2012-11-06 DIAGNOSIS — E7800 Pure hypercholesterolemia, unspecified: Secondary | ICD-10-CM | POA: Insufficient documentation

## 2012-11-06 DIAGNOSIS — E119 Type 2 diabetes mellitus without complications: Secondary | ICD-10-CM | POA: Insufficient documentation

## 2012-11-06 DIAGNOSIS — Z79899 Other long term (current) drug therapy: Secondary | ICD-10-CM | POA: Insufficient documentation

## 2012-11-06 DIAGNOSIS — E079 Disorder of thyroid, unspecified: Secondary | ICD-10-CM | POA: Insufficient documentation

## 2012-11-06 DIAGNOSIS — M79609 Pain in unspecified limb: Secondary | ICD-10-CM | POA: Insufficient documentation

## 2012-11-06 DIAGNOSIS — F17200 Nicotine dependence, unspecified, uncomplicated: Secondary | ICD-10-CM | POA: Insufficient documentation

## 2012-11-06 DIAGNOSIS — G40909 Epilepsy, unspecified, not intractable, without status epilepticus: Secondary | ICD-10-CM | POA: Insufficient documentation

## 2012-11-06 DIAGNOSIS — Z794 Long term (current) use of insulin: Secondary | ICD-10-CM | POA: Insufficient documentation

## 2012-11-06 DIAGNOSIS — F319 Bipolar disorder, unspecified: Secondary | ICD-10-CM | POA: Insufficient documentation

## 2012-11-06 DIAGNOSIS — I10 Essential (primary) hypertension: Secondary | ICD-10-CM | POA: Insufficient documentation

## 2012-11-06 MED ORDER — HYDROCODONE-ACETAMINOPHEN 5-325 MG PO TABS: 1.0000 | ORAL_TABLET | Freq: Four times a day (QID) | ORAL | Status: DC | PRN

## 2012-11-06 NOTE — ED Provider Notes (Signed)
Medical screening examination/treatment/procedure(s) were performed by non-physician practitioner and as supervising physician I was immediately available for consultation/collaboration.   Charles B. Sheldon, MD 11/06/12 1409 

## 2012-11-06 NOTE — ED Provider Notes (Signed)
CSN: [id number]     Arrival date & time 11/06/12  1230 History     First MD Initiated Contact with Patient 11/06/12 1313     Chief Complaint  Patient presents with  . Leg Pain   (Consider location/radiation/quality/duration/timing/severity/associated sxs/prior Treatment) HPI  37 year old female presenting with left flank pain. Patient reports for the past 2 weeks she has had persistent pain to her left leg. Pain started from her left hip radiates all the way down to the ED. Pain is described as a sharp sensation, shooting, worsening with walking, but is painful even with rest. She has been taking Tylenol and ibuprofen with minimal relief. She denies any specific trauma. Denies fever, chills, back pain, abdominal pain, dysuria, urinary or bowel incontinence, saddle paresthesia, numbness, weakness, or rash. Patient has not had any significant risk factor for PE or DVT including but not on birth control pill, no recent surgery, prolonged bed rest, calf pain, leg swelling. Patient does have a history of insulin-dependent diabetic.  Past Medical History  Diagnosis Date  . Diabetes mellitus   . Seizures   . Thyroid disease   . Hypertension   . High cholesterol   . Bipolar 1 disorder    Past Surgical History  Procedure Laterality Date  . Tubal ligation    . Ankle surgery     Family History  Problem Relation Age of Onset  . Diabetes Father   . Cancer Maternal Grandfather    History  Substance Use Topics  . Smoking status: Current Some Day Smoker -- 1.00 packs/day    Types: Cigarettes  . Smokeless tobacco: Not on file  . Alcohol Use: No   OB History   Grav Para Term Preterm Abortions TAB SAB Ect Mult Living                 Review of Systems  All other systems reviewed and are negative.    Allergies  Review of patient's allergies indicates no known allergies.  Home Medications   Current Outpatient Rx  Name  Route  Sig  Dispense  Refill  . haloperidol (HALDOL) 2 MG  tablet   Oral   Take 2 mg by mouth at bedtime.         . ARIPiprazole (ABILIFY) 10 MG tablet   Oral   Take 10 mg by mouth daily.           . insulin glargine (LANTUS) 100 UNIT/ML injection   Subcutaneous   Inject 15 Units into the skin daily.   10 mL   0   . levothyroxine (SYNTHROID, LEVOTHROID) 125 MCG tablet   Oral   Take 125 mcg by mouth daily.           . nicotine (NICOTROL) 10 MG inhaler   Inhalation   Inhale 2 puffs into the lungs daily as needed. To stop smoking         . phenytoin (DILANTIN) 100 MG ER capsule   Oral   Take 200 mg by mouth 2 (two) times daily.          . simvastatin (ZOCOR) 20 MG tablet   Oral   Take 20 mg by mouth every evening.          BP 119/92  Pulse 97  Temp(Src) 98.4 F (36.9 C) (Oral)  Resp 18  Ht 5\' 5"  (1.651 m)  Wt 140 lb (63.504 kg)  BMI 23.3 kg/m2  SpO2 100%  LMP 10/15/2012 Physical Exam  Nursing note and vitals reviewed. Constitutional: She is oriented to person, place, and time. She appears well-developed and well-nourished. No distress.  HENT:  Head: Atraumatic.  Eyes: Conjunctivae are normal.  Neck: Neck supple.  Abdominal: Soft. There is no tenderness.  Musculoskeletal: She exhibits tenderness (Generalized tenderness to left leg without focal point tenderness. Normal range of motion through left hip, left knee, and left ankle. Intact distal pulses. Brisk cap refills. No rash or edema noted.).  Bilateral lower extremities without palpable cords, erythema, edema, negative Homans sign.  No significant midline spine tenderness, crepitus, or step off  Neurological: She is alert and oriented to person, place, and time.    ED Course   Procedures (including critical care time)  1:44 PM Pt c/o LLE pain.  Generalized tenderness without focal point tenderness.  Is NVI.  No evidence of infection.  Ambulate without difficulty, no recent trauma.  Since pt has tried OTC pain meds without relief, will give short course  of pain medication.  Pt to f/u with her doctor or orthopedic specialist for further care.  Doubt infection, gout, DVT, vascular insufficiency, or spinal cord compression.  Labs Reviewed - No data to display No results found. 1. Left leg pain     MDM  BP 119/92  Pulse 97  Temp(Src) 98.4 F (36.9 C) (Oral)  Resp 18  Ht 5\' 5"  (1.651 m)  Wt 140 lb (63.504 kg)  BMI 23.3 kg/m2  SpO2 100%  LMP 10/15/2012   Fayrene Helper, PA-C 11/06/12 1347

## 2012-11-06 NOTE — ED Notes (Signed)
Left leg pain, worse for last week.  No known injury.  Pt states she was told she had gout.  Noted some swelling in left foot.

## 2013-09-01 ENCOUNTER — Encounter (HOSPITAL_BASED_OUTPATIENT_CLINIC_OR_DEPARTMENT_OTHER): Payer: Self-pay | Admitting: Emergency Medicine

## 2013-09-01 ENCOUNTER — Emergency Department (HOSPITAL_BASED_OUTPATIENT_CLINIC_OR_DEPARTMENT_OTHER)
Admission: EM | Admit: 2013-09-01 | Discharge: 2013-09-01 | Disposition: A | Discharge status: Home / Self Care | Payer: Self-pay | Attending: Emergency Medicine | Admitting: Emergency Medicine

## 2013-09-01 DIAGNOSIS — E119 Type 2 diabetes mellitus without complications: Secondary | ICD-10-CM | POA: Insufficient documentation

## 2013-09-01 DIAGNOSIS — R739 Hyperglycemia, unspecified: Secondary | ICD-10-CM

## 2013-09-01 DIAGNOSIS — F17200 Nicotine dependence, unspecified, uncomplicated: Secondary | ICD-10-CM | POA: Insufficient documentation

## 2013-09-01 DIAGNOSIS — G40909 Epilepsy, unspecified, not intractable, without status epilepticus: Secondary | ICD-10-CM | POA: Insufficient documentation

## 2013-09-01 DIAGNOSIS — F319 Bipolar disorder, unspecified: Secondary | ICD-10-CM | POA: Insufficient documentation

## 2013-09-01 DIAGNOSIS — E079 Disorder of thyroid, unspecified: Secondary | ICD-10-CM | POA: Insufficient documentation

## 2013-09-01 DIAGNOSIS — I10 Essential (primary) hypertension: Secondary | ICD-10-CM | POA: Insufficient documentation

## 2013-09-01 DIAGNOSIS — Z794 Long term (current) use of insulin: Secondary | ICD-10-CM | POA: Insufficient documentation

## 2013-09-01 DIAGNOSIS — R569 Unspecified convulsions: Secondary | ICD-10-CM

## 2013-09-01 DIAGNOSIS — Z79899 Other long term (current) drug therapy: Secondary | ICD-10-CM | POA: Insufficient documentation

## 2013-09-01 DIAGNOSIS — E7800 Pure hypercholesterolemia, unspecified: Secondary | ICD-10-CM | POA: Insufficient documentation

## 2013-09-01 LAB — CBC WITH DIFFERENTIAL/PLATELET
BASOS PCT: 0 % (ref 0–1)
Basophils Absolute: 0 10*3/uL (ref 0.0–0.1)
EOS ABS: 0 10*3/uL (ref 0.0–0.7)
EOS PCT: 0 % (ref 0–5)
HCT: 37.3 % (ref 36.0–46.0)
HEMOGLOBIN: 12.7 g/dL (ref 12.0–15.0)
LYMPHS ABS: 0.8 10*3/uL (ref 0.7–4.0)
Lymphocytes Relative: 9 % — ABNORMAL LOW (ref 12–46)
MCH: 31.6 pg (ref 26.0–34.0)
MCHC: 34 g/dL (ref 30.0–36.0)
MCV: 92.8 fL (ref 78.0–100.0)
MONO ABS: 0.3 10*3/uL (ref 0.1–1.0)
Monocytes Relative: 3 % (ref 3–12)
NEUTROS PCT: 88 % — AB (ref 43–77)
Neutro Abs: 8.1 10*3/uL — ABNORMAL HIGH (ref 1.7–7.7)
Platelets: 375 10*3/uL (ref 150–400)
RBC: 4.02 MIL/uL (ref 3.87–5.11)
RDW: 13.3 % (ref 11.5–15.5)
WBC: 9.2 10*3/uL (ref 4.0–10.5)

## 2013-09-01 LAB — BASIC METABOLIC PANEL
BUN: 6 mg/dL (ref 6–23)
CALCIUM: 9.7 mg/dL (ref 8.4–10.5)
CO2: 19 mEq/L (ref 19–32)
CREATININE: 0.5 mg/dL (ref 0.50–1.10)
Chloride: 95 mEq/L — ABNORMAL LOW (ref 96–112)
GLUCOSE: 447 mg/dL — AB (ref 70–99)
Potassium: 4.3 mEq/L (ref 3.7–5.3)
Sodium: 133 mEq/L — ABNORMAL LOW (ref 137–147)

## 2013-09-01 LAB — CBG MONITORING, ED: Glucose-Capillary: 258 mg/dL — ABNORMAL HIGH (ref 70–99)

## 2013-09-01 LAB — PHENYTOIN LEVEL, TOTAL: Phenytoin Lvl: <2.5 ug/mL — ABNORMAL LOW (ref 10.0–20.0)

## 2013-09-01 MED ORDER — KETOROLAC TROMETHAMINE 30 MG/ML IJ SOLN
30.0000 mg | Freq: Once | INTRAMUSCULAR | Status: AC
  Administered 2013-09-01: 30 mg via INTRAVENOUS
  Filled 2013-09-01: qty 1

## 2013-09-01 MED ORDER — PHENYTOIN SODIUM EXTENDED 100 MG PO CAPS
300.0000 mg | ORAL_CAPSULE | Freq: Once | ORAL | Status: AC
  Administered 2013-09-01: 300 mg via ORAL
  Filled 2013-09-01: qty 3

## 2013-09-01 MED ORDER — HYDROCODONE-ACETAMINOPHEN 5-325 MG PO TABS
2.0000 | ORAL_TABLET | Freq: Once | ORAL | Status: AC
  Administered 2013-09-01: 2 via ORAL
  Filled 2013-09-01: qty 2

## 2013-09-01 MED ORDER — LORAZEPAM 2 MG/ML IJ SOLN
1.0000 mg | Freq: Once | INTRAMUSCULAR | Status: AC
  Administered 2013-09-01: 1 mg via INTRAVENOUS
  Filled 2013-09-01: qty 1

## 2013-09-01 MED ORDER — SODIUM CHLORIDE 0.9 % IV SOLN
Freq: Once | INTRAVENOUS | Status: AC
  Administered 2013-09-01: 19:00:00 via INTRAVENOUS

## 2013-09-01 NOTE — ED Provider Notes (Signed)
CSN: [id number]     Arrival date & time 09/01/13  1630 History   First MD Initiated Contact with Patient 09/01/13 1644     Chief Complaint  Patient presents with  . "shaking"      (Consider location/radiation/quality/duration/timing/severity/associated sxs/prior Treatment) Patient is a 38 y.o. female presenting with seizures. The history is provided by the patient. No language interpreter was used.  Seizures Seizure activity on arrival: no   Seizure type:  Unable to specify Preceding symptoms: dizziness and nausea   Preceding symptoms: no sensation of an aura present   Initial focality:  None Episode characteristics: abnormal movements and focal shaking   Postictal symptoms: no confusion   Return to baseline: yes   Severity:  Moderate Timing:  Once Progression:  Worsening Recent head injury:  No recent head injuries PTA treatment:  None History of seizures: yes   Pt reports seizure today,  Shaking,   Pt states she still feels shaky.   Pt is suppose to pick up dilantin tomorrow.   Pt did not take today  Past Medical History  Diagnosis Date  . Diabetes mellitus   . Seizures   . Thyroid disease   . Hypertension   . High cholesterol   . Bipolar 1 disorder    Past Surgical History  Procedure Laterality Date  . Tubal ligation    . Ankle surgery     Family History  Problem Relation Age of Onset  . Diabetes Father   . Cancer Maternal Grandfather    History  Substance Use Topics  . Smoking status: Current Some Day Smoker -- 1.00 packs/day    Types: Cigarettes  . Smokeless tobacco: Not on file  . Alcohol Use: No   OB History   Grav Para Term Preterm Abortions TAB SAB Ect Mult Living                 Review of Systems  Neurological: Positive for seizures.  All other systems reviewed and are negative.     Allergies  Review of patient's allergies indicates no known allergies.  Home Medications   Prior to Admission medications   Medication Sig Start Date End  Date Taking? Authorizing Provider  risperiDONE (RISPERDAL) 0.5 MG tablet Take 0.5 mg by mouth at bedtime.   Yes Historical Provider, MD  traZODone (DESYREL) 100 MG tablet Take 100 mg by mouth at bedtime.   Yes Historical Provider, MD  ARIPiprazole (ABILIFY) 10 MG tablet Take 10 mg by mouth daily.      Historical Provider, MD  haloperidol (HALDOL) 2 MG tablet Take 2 mg by mouth at bedtime.    Historical Provider, MD  HYDROcodone-acetaminophen (NORCO/VICODIN) 5-325 MG per tablet Take 1 tablet by mouth every 6 (six) hours as needed for pain. 11/06/12   Domenic Moras, PA-C  insulin glargine (LANTUS) 100 UNIT/ML injection Inject 15 Units into the skin daily. 06/22/12   Belkys A Regalado, MD  levothyroxine (SYNTHROID, LEVOTHROID) 125 MCG tablet Take 125 mcg by mouth daily.      Historical Provider, MD  nicotine (NICOTROL) 10 MG inhaler Inhale 2 puffs into the lungs daily as needed. To stop smoking    Historical Provider, MD  phenytoin (DILANTIN) 100 MG ER capsule Take 200 mg by mouth 2 (two) times daily.     Historical Provider, MD  simvastatin (ZOCOR) 20 MG tablet Take 20 mg by mouth every evening.    Historical Provider, MD   BP 132/86  Pulse 117  Temp(Src) 101 F (  38.3 C) (Oral)  Resp 16  Ht 5\' 5"  (1.651 m)  Wt 140 lb (63.504 kg)  BMI 23.30 kg/m2  SpO2 100%  LMP 08/26/2013 Physical Exam  Nursing note and vitals reviewed. Constitutional: She is oriented to person, place, and time. She appears well-developed and well-nourished.  HENT:  Head: Normocephalic and atraumatic.  Eyes: Right eye exhibits no discharge. Left eye exhibits no discharge.  Neck: Normal range of motion.  Cardiovascular: Normal rate and normal heart sounds.   Pulmonary/Chest: Effort normal and breath sounds normal.  Abdominal: Soft. She exhibits no distension.  Musculoskeletal: Normal range of motion.  Neurological: She is alert and oriented to person, place, and time. She has normal reflexes. No cranial nerve deficit.  Coordination normal.  Skin: Skin is warm.  Psychiatric: She has a normal mood and affect.    ED Course  Procedures (including critical care time) Labs Review Labs Reviewed  CBC WITH DIFFERENTIAL - Abnormal; Notable for the following:    Neutrophils Relative % 88 (*)    Lymphocytes Relative 9 (*)    Neutro Abs 8.1 (*)    All other components within normal limits  BASIC METABOLIC PANEL - Abnormal; Notable for the following:    Sodium 133 (*)    Chloride 95 (*)    Glucose, Bld 447 (*)    All other components within normal limits  PHENYTOIN LEVEL, TOTAL - Abnormal; Notable for the following:    Phenytoin Lvl <2.5 (*)    All other components within normal limits  URINALYSIS, ROUTINE W REFLEX MICROSCOPIC  PREGNANCY, URINE    Imaging Review No results found.   EKG Interpretation None      MDM   Final diagnoses:  Seizure  Hyperglycemia    Pt given iv fluids, Pt given dosage of dilanitn.   Pt counseled on elevated glucose.  Pt had a candy and orange juice before coming in because mother thought her sugar was low.   Pt feels better,   I gave ativan for anxiety.   Glucose decreased with Iv fluids    Fransico Meadow, [place] 09/01/13 2059

## 2013-09-01 NOTE — ED Provider Notes (Signed)
Medical screening examination/treatment/procedure(s) were performed by non-physician practitioner and as supervising physician I was immediately available for consultation/collaboration.   EKG Interpretation None        Delice Bison Ward, DO 09/01/13 2116

## 2013-09-01 NOTE — ED Notes (Signed)
D/c home with family to drive 

## 2013-09-01 NOTE — ED Notes (Signed)
Pt states that she has been awake and she is able to recall all of her activities today.

## 2013-09-01 NOTE — ED Notes (Signed)
Pt to room 4 in w/c, able to stand and walk to bed, pt is awake and alert, describes driving to her mothers house earlier "and I started shaking in the car." pt states she then continued driving to her mother's house. Her mother then drove her to her work, while pt was still shaking, to show them that she could not come in to work today. Mother then insisted that pt come to ed for eval. When distracted, pt is calm and no movements noted. When this rn speaks with her mother, pt begins shaking her legs, and making grunting noises. Pt remains awake and alert, when this rn asks her to stop moving, pt is able to stop movements.

## 2013-09-01 NOTE — Discharge Instructions (Signed)
Epilepsy °Epilepsy is a disorder in which a person has repeated seizures over time. A seizure is a release of abnormal electrical activity in the brain. Seizures can cause a change in attention, behavior, or the ability to remain awake and alert (altered mental status). Seizures often involve uncontrollable shaking (convulsions).  °Most people with epilepsy lead normal lives. However, people with epilepsy are at an increased risk of falls, accidents, and injuries. Therefore, it is important to begin treatment right away. °CAUSES  °Epilepsy has many possible causes. Anything that disturbs the normal pattern of brain cell activity can lead to seizures. This may include:  °· Head injury. °· Birth trauma. °· High fever as a child. °· Stroke. °· Bleeding into or around the brain. °· Certain drugs. °· Prolonged low oxygen, such as what occurs after CPR efforts. °· Abnormal brain development. °· Certain illnesses, such as meningitis, encephalitis (brain infection), malaria, and other infections. °· An imbalance of nerve signaling chemicals (neurotransmitters).   °SIGNS AND SYMPTOMS  °The symptoms of a seizure can vary greatly from one person to another. Right before a seizure, you may have a warning (aura) that a seizure is about to occur. An aura may include the following symptoms: °· Fear or anxiety. °· Nausea. °· Feeling like the room is spinning (vertigo). °· Vision changes, such as seeing flashing lights or spots. °Common symptoms during a seizure include: °· Abnormal sensations, such as an abnormal smell or a bitter taste in the mouth.   °· Sudden, general body stiffness.   °· Convulsions that involve rhythmic jerking of the face, arm, or leg on one or both sides.   °· Sudden change in consciousness.   °· Appearing to be awake but not responding.   °· Appearing to be asleep but cannot be awakened.   °· Grimacing, chewing, lip smacking, drooling, tongue biting, or loss of bowel or bladder control. °After a seizure,  you may feel sleepy for a while.  °DIAGNOSIS  °Your health care provider will ask about your symptoms and take a medical history. Descriptions from any witnesses to your seizures will be very helpful in the diagnosis. A physical exam, including a detailed neurological exam, is necessary. Various tests may be done, such as:  °· An electroencephalogram (EEG). This is a painless test of your brain waves. In this test, a diagram is created of your brain waves. These diagrams can be interpreted by a specialist. °· An MRI of the brain.   °· A CT scan of the brain.   °· A spinal tap (lumbar puncture, LP). °· Blood tests to check for signs of infection or abnormal blood chemistry. °TREATMENT  °There is no cure for epilepsy, but it is generally treatable. Once epilepsy is diagnosed, it is important to begin treatment as soon as possible. For most people with epilepsy, seizures can be controlled with medicines. The following may also be used: °· A pacemaker for the brain (vagus nerve stimulator) can be used for people with seizures that are not well controlled by medicine. °· Surgery on the brain. °For some people, epilepsy eventually goes away. °HOME CARE INSTRUCTIONS  °· Follow your health care provider's recommendations on driving and safety in normal activities. °· Get enough rest. Lack of sleep can cause seizures. °· Only take over-the-counter or prescription medicines as directed by your health care provider. Take any prescribed medicine exactly as directed. °· Avoid any known triggers of your seizures. °· Keep a seizure diary. Record what you recall about any seizure, especially any possible trigger.   °· Make   sure the people you live and work with know that you are prone to seizures. They should receive instructions on how to help you. In general, a witness to a seizure should:   Cushion your head and body.   Turn you on your side.   Avoid unnecessarily restraining you.   Not place anything inside your  mouth.   Call for emergency medical help if there is any question about what has occurred.   Follow up with your health care provider as directed. You may need regular blood tests to monitor the levels of your medicine.  SEEK MEDICAL CARE IF:   You develop signs of infection or other illness. This might increase the risk of a seizure.   You seem to be having more frequent seizures.   Your seizure pattern is changing.  SEEK IMMEDIATE MEDICAL CARE IF:   You have a seizure that does not stop after a few moments.   You have a seizure that causes any difficulty in breathing.   You have a seizure that results in a very severe headache.   You have a seizure that leaves you with the inability to speak or use a part of your body.  Document Released: 03/31/2005 Document Revised: 01/19/2013 Document Reviewed: 11/10/2012 [facility] Patient Information 2014 [place]. Driving and Equipment Restrictions Some medical problems make it dangerous to drive, ride a bike, or use machines. Some of these problems are:  A hard blow to the head (concussion).  Passing out (fainting).  Twitching and shaking (seizures).  Low blood sugar.  Taking medicine to help you relax (sedatives).  Taking pain medicines.  Wearing an eye patch.  Wearing splints. This can make it hard to use parts of your body that you need to drive safely. HOME CARE   Do not drive until your doctor says it is okay.  Do not use machines until your doctor says it is okay. You may need a form signed by your doctor (medical release) before you can drive again. You may also need this form before you do other tasks where you need to be fully alert. MAKE SURE YOU:  Understand these instructions.  Will watch your condition.  Will get help right away if you are not doing well or get worse. Document Released: 05/08/2004 Document Revised: 06/23/2011 Document Reviewed: 08/08/2009 [facility] Patient Information 2014  [place]. Hyperglycemia Hyperglycemia occurs when the glucose (sugar) in your blood is too high. Hyperglycemia can happen for many reasons, but it most often happens to people who do not know they have diabetes or are not managing their diabetes properly.  CAUSES  Whether you have diabetes or not, there are other causes of hyperglycemia. Hyperglycemia can occur when you have diabetes, but it can also occur in other situations that you might not be as aware of, such as: Diabetes  If you have diabetes and are having problems controlling your blood glucose, hyperglycemia could occur because of some of the following reasons:  Not following your meal plan.  Not taking your diabetes medications or not taking it properly.  Exercising less or doing less activity than you normally do.  Being sick. Pre-diabetes  This cannot be ignored. Before people develop Type 2 diabetes, they almost always have "pre-diabetes." This is when your blood glucose levels are higher than normal, but not yet high enough to be diagnosed as diabetes. Research has shown that some long-term damage to the body, especially the heart and circulatory system, may already be occurring  during pre-diabetes. If you take action to manage your blood glucose when you have pre-diabetes, you may delay or prevent Type 2 diabetes from developing. Stress  If you have diabetes, you may be "diet" controlled or on oral medications or insulin to control your diabetes. However, you may find that your blood glucose is higher than usual in the hospital whether you have diabetes or not. This is often referred to as "stress hyperglycemia." Stress can elevate your blood glucose. This happens because of hormones put out by the body during times of stress. If stress has been the cause of your high blood glucose, it can be followed regularly by your caregiver. That way he/she can make sure your hyperglycemia does not continue to get worse or progress to  diabetes. Steroids  Steroids are medications that act on the infection fighting system (immune system) to block inflammation or infection. One side effect can be a rise in blood glucose. Most people can produce enough extra insulin to allow for this rise, but for those who cannot, steroids make blood glucose levels go even higher. It is not unusual for steroid treatments to "uncover" diabetes that is developing. It is not always possible to determine if the hyperglycemia will go away after the steroids are stopped. A special blood test called an A1c is sometimes done to determine if your blood glucose was elevated before the steroids were started. SYMPTOMS  Thirsty.  Frequent urination.  Dry mouth.  Blurred vision.  Tired or fatigue.  Weakness.  Sleepy.  Tingling in feet or leg. DIAGNOSIS  Diagnosis is made by monitoring blood glucose in one or all of the following ways:  A1c test. This is a chemical found in your blood.  Fingerstick blood glucose monitoring.  Laboratory results. TREATMENT  First, knowing the cause of the hyperglycemia is important before the hyperglycemia can be treated. Treatment may include, but is not be limited to:  Education.  Change or adjustment in medications.  Change or adjustment in meal plan.  Treatment for an illness, infection, etc.  More frequent blood glucose monitoring.  Change in exercise plan.  Decreasing or stopping steroids.  Lifestyle changes. HOME CARE INSTRUCTIONS   Test your blood glucose as directed.  Exercise regularly. Your caregiver will give you instructions about exercise. Pre-diabetes or diabetes which comes on with stress is helped by exercising.  Eat wholesome, balanced meals. Eat often and at regular, fixed times. Your caregiver or nutritionist will give you a meal plan to guide your sugar intake.  Being at an ideal weight is important. If needed, losing as little as 10 to 15 pounds may help improve blood  glucose levels. SEEK MEDICAL CARE IF:   You have questions about medicine, activity, or diet.  You continue to have symptoms (problems such as increased thirst, urination, or weight gain). SEEK IMMEDIATE MEDICAL CARE IF:   You are vomiting or have diarrhea.  Your breath smells fruity.  You are breathing faster or slower.  You are very sleepy or incoherent.  You have numbness, tingling, or pain in your feet or hands.  You have chest pain.  Your symptoms get worse even though you have been following your caregiver's orders.  If you have any other questions or concerns. Document Released: 09/24/2000 Document Revised: 06/23/2011 Document Reviewed: 07/28/2011 [facility] Patient Information 2014 [place], [place].

## 2013-10-09 ENCOUNTER — Encounter (HOSPITAL_BASED_OUTPATIENT_CLINIC_OR_DEPARTMENT_OTHER): Payer: Self-pay | Admitting: Emergency Medicine

## 2013-10-09 ENCOUNTER — Emergency Department (HOSPITAL_BASED_OUTPATIENT_CLINIC_OR_DEPARTMENT_OTHER)
Admission: EM | Admit: 2013-10-09 | Discharge: 2013-10-10 | Disposition: A | Discharge status: Other Acute Inpatient Hospital | Payer: Self-pay | Attending: Emergency Medicine | Admitting: Emergency Medicine

## 2013-10-09 DIAGNOSIS — Z794 Long term (current) use of insulin: Secondary | ICD-10-CM | POA: Insufficient documentation

## 2013-10-09 DIAGNOSIS — G40909 Epilepsy, unspecified, not intractable, without status epilepticus: Secondary | ICD-10-CM | POA: Insufficient documentation

## 2013-10-09 DIAGNOSIS — I10 Essential (primary) hypertension: Secondary | ICD-10-CM | POA: Insufficient documentation

## 2013-10-09 DIAGNOSIS — L02215 Cutaneous abscess of perineum: Secondary | ICD-10-CM

## 2013-10-09 DIAGNOSIS — Z79899 Other long term (current) drug therapy: Secondary | ICD-10-CM | POA: Insufficient documentation

## 2013-10-09 DIAGNOSIS — F319 Bipolar disorder, unspecified: Secondary | ICD-10-CM | POA: Insufficient documentation

## 2013-10-09 DIAGNOSIS — E079 Disorder of thyroid, unspecified: Secondary | ICD-10-CM | POA: Insufficient documentation

## 2013-10-09 DIAGNOSIS — F17200 Nicotine dependence, unspecified, uncomplicated: Secondary | ICD-10-CM | POA: Insufficient documentation

## 2013-10-09 DIAGNOSIS — Z3202 Encounter for pregnancy test, result negative: Secondary | ICD-10-CM | POA: Insufficient documentation

## 2013-10-09 DIAGNOSIS — L03319 Cellulitis of trunk, unspecified: Principal | ICD-10-CM

## 2013-10-09 DIAGNOSIS — E119 Type 2 diabetes mellitus without complications: Secondary | ICD-10-CM | POA: Insufficient documentation

## 2013-10-09 DIAGNOSIS — L02219 Cutaneous abscess of trunk, unspecified: Secondary | ICD-10-CM | POA: Insufficient documentation

## 2013-10-09 DIAGNOSIS — E7800 Pure hypercholesterolemia, unspecified: Secondary | ICD-10-CM | POA: Insufficient documentation

## 2013-10-09 LAB — CBG MONITORING, ED: Glucose-Capillary: 308 mg/dL — ABNORMAL HIGH (ref 70–99)

## 2013-10-09 NOTE — ED Provider Notes (Signed)
CSN: [id number]     Arrival date & time 10/09/13  2322 History  This chart was scribed for April Alfonso Patten, MD by Roxan Diesel, ED scribe.  This patient was seen in room MH10/MH10 and the patient's care was started at 11:40 PM.   Chief Complaint  Patient presents with  . Abscess    Patient is a 38 y.o. female presenting with abscess. The history is provided by the patient. No language interpreter was used.  Abscess Location:  Ano-genital Ano-genital abscess location:  Vagina Duration:  3 days Progression:  Worsening Context: diabetes   Associated symptoms: no fever and no vomiting   Risk factors comment:  DM   HPI Comments: Zoe Clay is a 38 y.o. female with h/o DM who presents to the Emergency Department complaining of a persistent, progressively-worsening, painful abscess to her vaginal area that she first noticed 3 days ago.  Pt has used Tylenol and ibuprofen for her associated pain without relief.  She was diagnosed with DM at age 40 and takes insulin injections.  She states her sugars generally run in the 100s.  She has a regular appointment with her endocrinologist soon.  Pap smears are UTD. 1st day of LNMP was June 1st.   Past Medical History  Diagnosis Date  . Diabetes mellitus   . Seizures   . Thyroid disease   . Hypertension   . High cholesterol   . Bipolar 1 disorder     Past Surgical History  Procedure Laterality Date  . Tubal ligation    . Ankle surgery      Family History  Problem Relation Age of Onset  . Diabetes Father   . Cancer Maternal Grandfather     History  Substance Use Topics  . Smoking status: Current Some Day Smoker -- 1.00 packs/day    Types: Cigarettes  . Smokeless tobacco: Not on file  . Alcohol Use: No    OB History   Grav Para Term Preterm Abortions TAB SAB Ect Mult Living                  Review of Systems  Constitutional: Negative for fever.  Gastrointestinal: Negative for vomiting, diarrhea and  constipation.  Skin: Positive for wound (abscess).  All other systems reviewed and are negative.     Allergies  Review of patient's allergies indicates no known allergies.  Home Medications   Prior to Admission medications   Medication Sig Start Date End Date Taking? Authorizing Mitsuru Dault  ARIPiprazole (ABILIFY) 10 MG tablet Take 10 mg by mouth daily.     Yes Historical Milayah Krell, MD  insulin aspart (NOVOLOG) 100 UNIT/ML injection Inject into the skin 3 (three) times daily before meals.   Yes Historical Rey Fors, MD  insulin glargine (LANTUS) 100 UNIT/ML injection Inject 15 Units into the skin daily. 06/22/12  Yes Belkys A Regalado, MD  levothyroxine (SYNTHROID, LEVOTHROID) 125 MCG tablet Take 125 mcg by mouth daily.     Yes Historical Norton Bivins, MD  phenytoin (DILANTIN) 100 MG ER capsule Take 200 mg by mouth 2 (two) times daily.    Yes Historical Darilyn Storbeck, MD  risperiDONE (RISPERDAL) 0.5 MG tablet Take 0.5 mg by mouth at bedtime.   Yes Historical Carren Blakley, MD  simvastatin (ZOCOR) 20 MG tablet Take 20 mg by mouth every evening.   Yes Historical Deshanda Molitor, MD  traZODone (DESYREL) 100 MG tablet Take 100 mg by mouth at bedtime.   Yes Historical Vincenza Dail, MD  haloperidol (HALDOL) 2 MG tablet  Take 2 mg by mouth at bedtime.    Historical Maybell Misenheimer, MD  HYDROcodone-acetaminophen (NORCO/VICODIN) 5-325 MG per tablet Take 1 tablet by mouth every 6 (six) hours as needed for pain. 11/06/12   Domenic Moras, PA-C  nicotine (NICOTROL) 10 MG inhaler Inhale 2 puffs into the lungs daily as needed. To stop smoking    Historical Zaquan Duffner, MD   BP 142/82  Pulse 99  Temp(Src) 98.2 F (36.8 C) (Oral)  Resp 18  Ht 5\' 5"  (1.651 m)  Wt 148 lb (67.132 kg)  BMI 24.63 kg/m2  SpO2 100%  LMP 09/15/2013  Physical Exam  Nursing note and vitals reviewed. Constitutional: She is oriented to person, place, and time. She appears well-developed and well-nourished. No distress.  HENT:  Head: Normocephalic and atraumatic.   Mouth/Throat: Mucous membranes are normal.  Eyes: Conjunctivae and EOM are normal. Pupils are equal, round, and reactive to light.  Neck: Neck supple. No tracheal deviation present.  Cardiovascular: Normal rate and regular rhythm.   Pulmonary/Chest: Effort normal and breath sounds normal. No respiratory distress. She has no wheezes. She has no rales.  Abdominal: Soft. Bowel sounds are normal. There is no rebound and no guarding.  Musculoskeletal: Normal range of motion.  Neurological: She is alert and oriented to person, place, and time.  Skin: Skin is warm and dry.  Psychiatric: She has a normal mood and affect. Her behavior is normal.    ED Course  Procedures (including critical care time)  DIAGNOSTIC STUDIES: Oxygen Saturation is 100% on room air, normal by my interpretation.    COORDINATION OF CARE: 11:46 PM-Discussed treatment plan which includes labs with pt at bedside and pt agreed to plan.     Labs Review Labs Reviewed - No data to display  Imaging Review No results found.   EKG Interpretation None      MDM   Final diagnoses:  None   MDM Reviewed: previous chart, nursing note and vitals Reviewed previous: labs and CT scan Interpretation: labs and CT scan Consults: admitting MD   Medications  vancomycin (VANCOCIN) IVPB 1000 mg/200 mL premix (not administered)  fentaNYL (SUBLIMAZE) injection 50 mcg (50 mcg Intravenous Given 10/10/13 0116)  iohexol (OMNIPAQUE) 300 MG/ML solution 100 mL (100 mLs Intravenous Contrast Given 10/10/13 0204)  diphenhydrAMINE (BENADRYL) injection 12.5 mg (12.5 mg Intravenous Given 10/10/13 0205)  Ampicillin-Sulbactam (UNASYN) 3 g in sodium chloride 0.9 % 100 mL IVPB (3 g Intravenous New Bag/Given 10/10/13 0514)  HYDROmorphone (DILAUDID) injection 1 mg (1 mg Intravenous Given 10/10/13 0514)  sodium chloride 0.9 % bolus 1,000 mL (1,000 mLs Intravenous New Bag/Given 10/10/13 0514)   Case d/w Dr. Ihor Dow [facility] dm  control admit for IV abx do not ID in the ED    Case d/w Dr. Dema Severin on call for Dr. Sharlet Salina admit to medicine IV abx and consult as appropriate, good DM control   I personally performed the services described in this documentation, which was scribed in my presence. The recorded information has been reviewed and is accurate.    Carlisle Beers, MD 10/10/13 [phone]

## 2013-10-09 NOTE — ED Notes (Signed)
Abscess tray at bedside

## 2013-10-09 NOTE — ED Notes (Signed)
Pt now reports foul odor with abscess

## 2013-10-09 NOTE — ED Notes (Signed)
Pt with abscess to vaginal area

## 2013-10-10 ENCOUNTER — Encounter (HOSPITAL_BASED_OUTPATIENT_CLINIC_OR_DEPARTMENT_OTHER): Payer: Self-pay | Admitting: Emergency Medicine

## 2013-10-10 ENCOUNTER — Emergency Department (HOSPITAL_BASED_OUTPATIENT_CLINIC_OR_DEPARTMENT_OTHER): Payer: Self-pay

## 2013-10-10 LAB — CBC WITH DIFFERENTIAL/PLATELET
Basophils Absolute: 0 10*3/uL (ref 0.0–0.1)
Basophils Relative: 0 % (ref 0–1)
EOS PCT: 0 % (ref 0–5)
Eosinophils Absolute: 0 10*3/uL (ref 0.0–0.7)
HEMATOCRIT: 32.6 % — AB (ref 36.0–46.0)
HEMOGLOBIN: 11.2 g/dL — AB (ref 12.0–15.0)
LYMPHS ABS: 2.9 10*3/uL (ref 0.7–4.0)
LYMPHS PCT: 25 % (ref 12–46)
MCH: 30.7 pg (ref 26.0–34.0)
MCHC: 34.4 g/dL (ref 30.0–36.0)
MCV: 89.3 fL (ref 78.0–100.0)
MONOS PCT: 5 % (ref 3–12)
Monocytes Absolute: 0.6 10*3/uL (ref 0.1–1.0)
Neutro Abs: 8.3 10*3/uL — ABNORMAL HIGH (ref 1.7–7.7)
Neutrophils Relative %: 70 % (ref 43–77)
PLATELETS: 446 10*3/uL — AB (ref 150–400)
RBC: 3.65 MIL/uL — AB (ref 3.87–5.11)
RDW: 14.5 % (ref 11.5–15.5)
WBC: 11.9 10*3/uL — AB (ref 4.0–10.5)

## 2013-10-10 LAB — URINE MICROSCOPIC-ADD ON

## 2013-10-10 LAB — BASIC METABOLIC PANEL
BUN: 12 mg/dL (ref 6–23)
CALCIUM: 9.3 mg/dL (ref 8.4–10.5)
CO2: 19 meq/L (ref 19–32)
Chloride: 95 mEq/L — ABNORMAL LOW (ref 96–112)
Creatinine, Ser: 0.7 mg/dL (ref 0.50–1.10)
GFR calc Af Amer: >90 mL/min (ref >90)
GFR calc non Af Amer: >90 mL/min (ref >90)
GLUCOSE: 282 mg/dL — AB (ref 70–99)
Potassium: 3.2 mEq/L — ABNORMAL LOW (ref 3.7–5.3)
SODIUM: 136 meq/L — AB (ref 137–147)

## 2013-10-10 LAB — PREGNANCY, URINE: PREG TEST UR: NEGATIVE

## 2013-10-10 LAB — URINALYSIS, ROUTINE W REFLEX MICROSCOPIC
BILIRUBIN URINE: NEGATIVE
Ketones, ur: NEGATIVE mg/dL
Nitrite: NEGATIVE
PROTEIN: NEGATIVE mg/dL
SPECIFIC GRAVITY, URINE: 1.027 (ref 1.005–1.030)
Urobilinogen, UA: 0.2 mg/dL (ref 0.0–1.0)
pH: 5.5 (ref 5.0–8.0)

## 2013-10-10 IMAGING — CT CT ABD-PELV W/ CM
2 of 4 series · 16 of 46 positions shown, 18 images · IV contrast (APPLIED)
Comparison: None.

CLINICAL DATA: Right vaginal abscess or 3 days.

EXAM:
CT ABDOMEN AND PELVIS WITH CONTRAST
TECHNIQUE: Multidetector CT imaging of the abdomen and pelvis was performed
using the standard protocol following bolus administration of
intravenous contrast.
CONTRAST:  100mL OMNIPAQUE IOHEXOL 300 MG/ML  SOLN

[Series 2: abd/pelvis 5.0 b31f · axial · 0.88mm/px · z∈[-612,-117]mm · 13 of 109 slices shown, 15 images]
[im 5/109  soft-tissue]
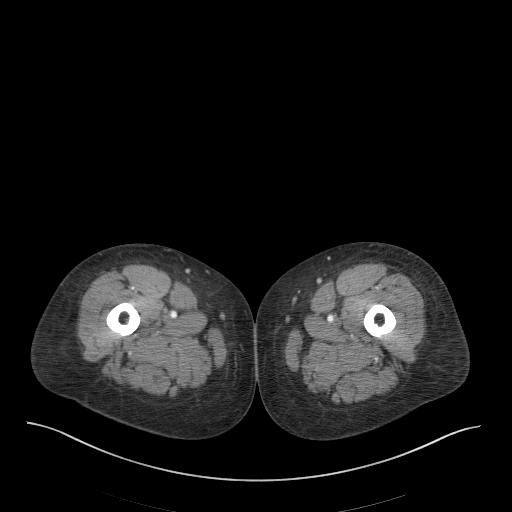
[im 5/109  bone]
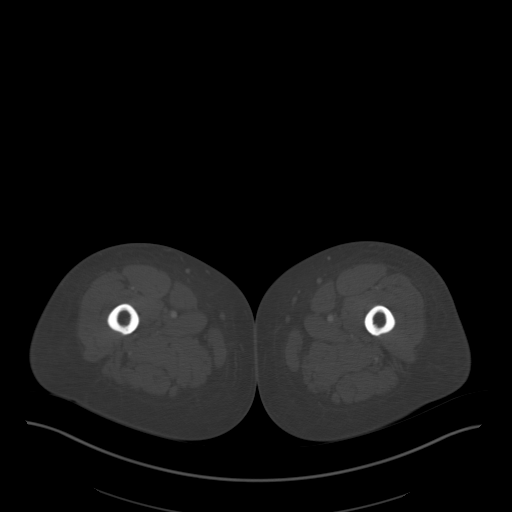
[im 13/109  soft-tissue]
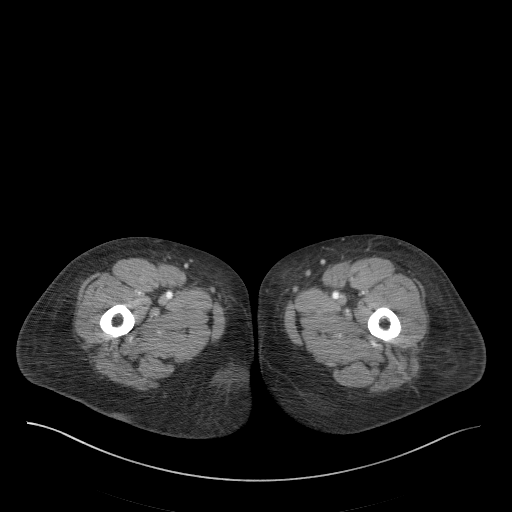
[im 22/109  soft-tissue]
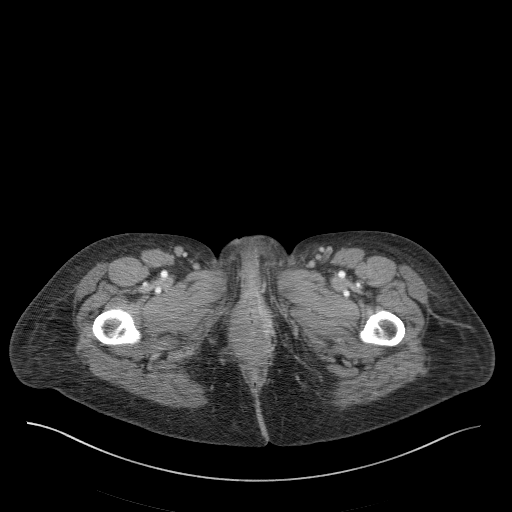
[im 31/109  soft-tissue]
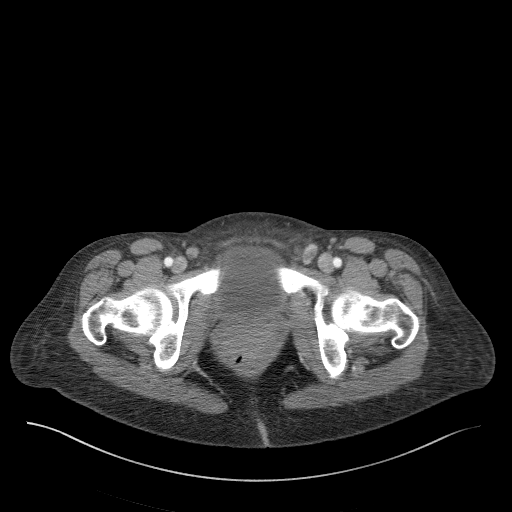
[im 39/109  soft-tissue]
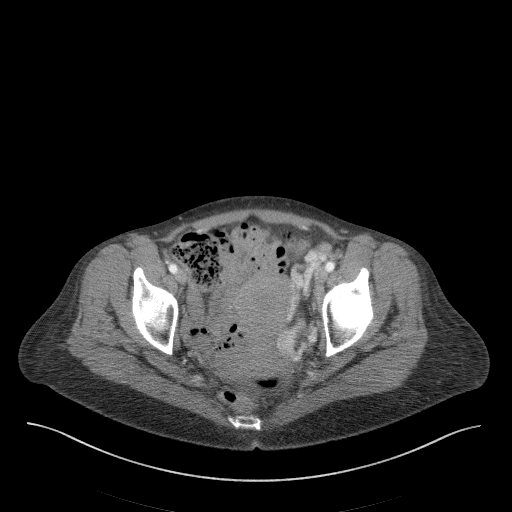
[im 48/109  soft-tissue]
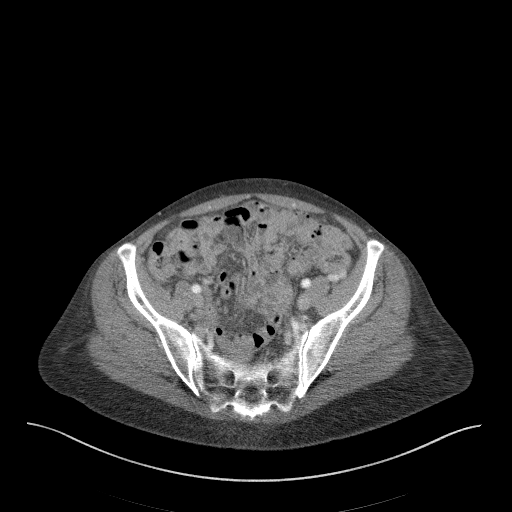
[im 57/109  soft-tissue]
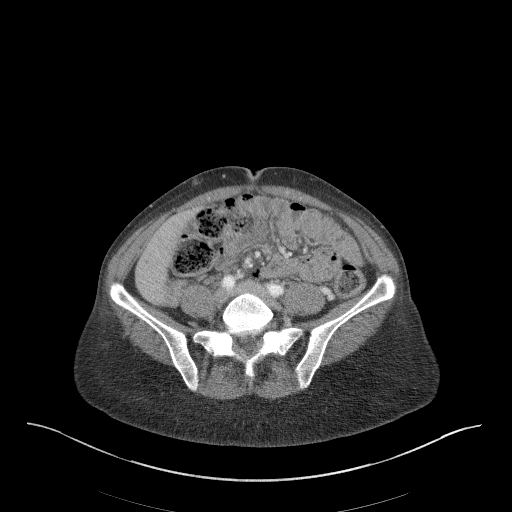
[im 61/109  soft-tissue]
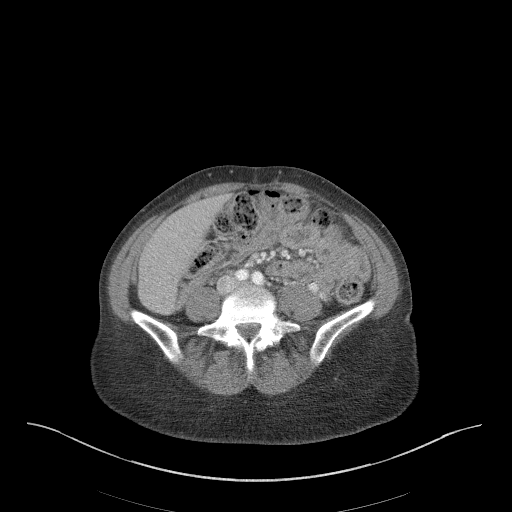
[im 70/109  soft-tissue]
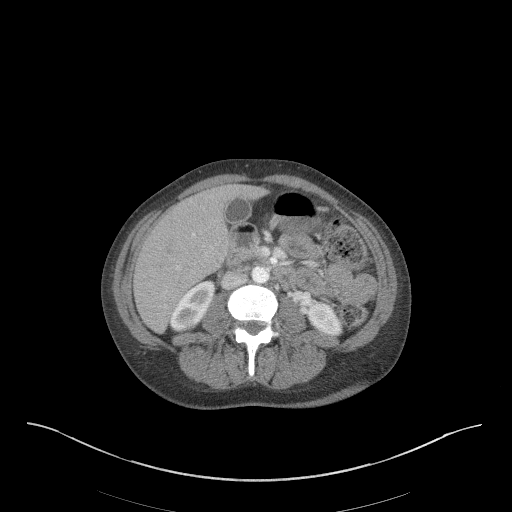
[im 70/109  bone]
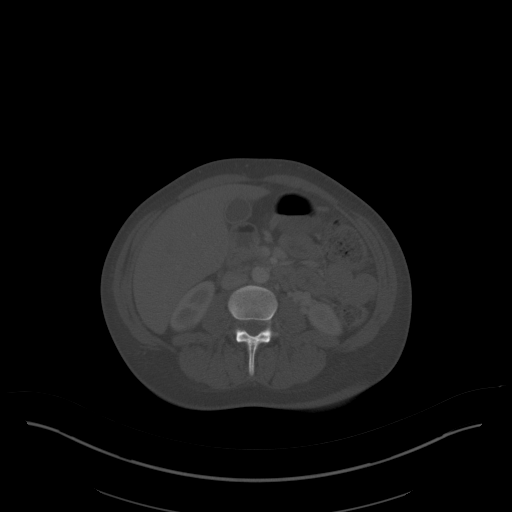
[im 78/109  soft-tissue]
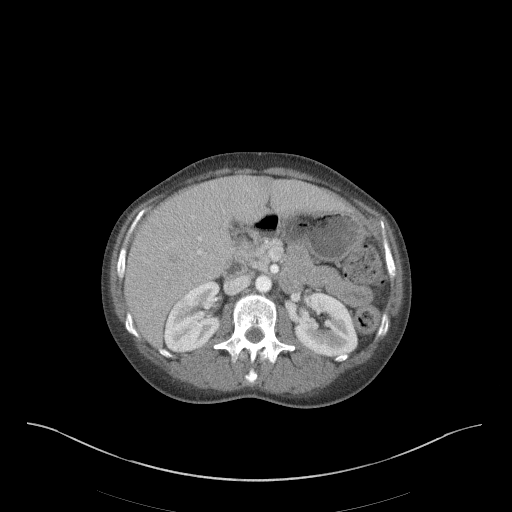
[im 87/109  soft-tissue]
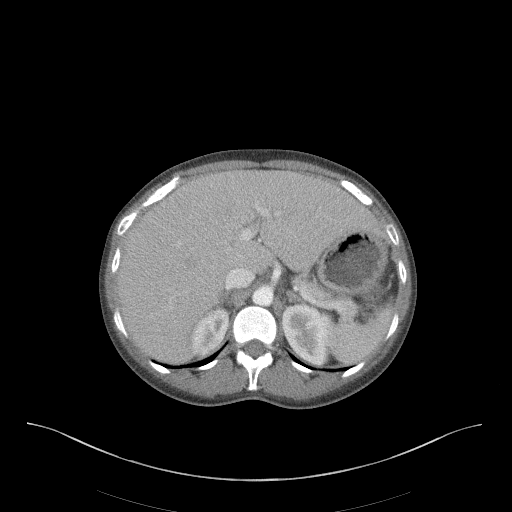
[im 96/109  soft-tissue]
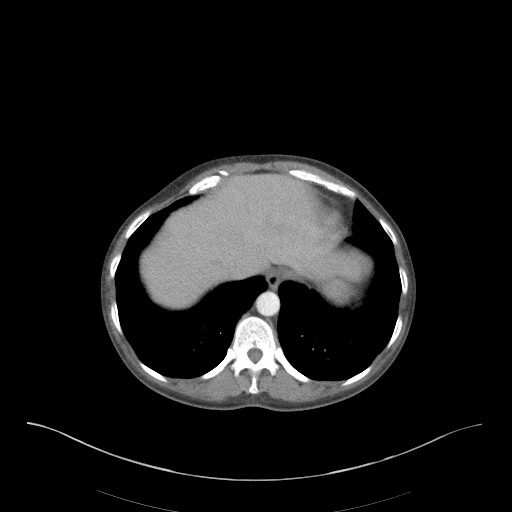
[im 104/109  soft-tissue]
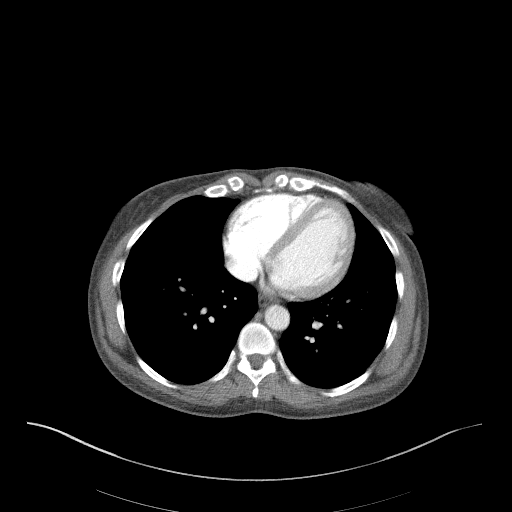

[Series 5: abd/pelvis 3.0 coronal · coronal · 0.91mm/px · 3 of 88 slices shown]
[im 30/88  soft-tissue]
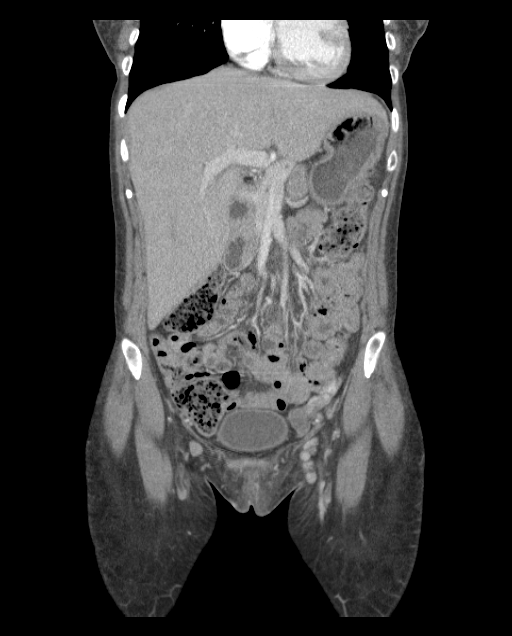
[im 39/88  soft-tissue]
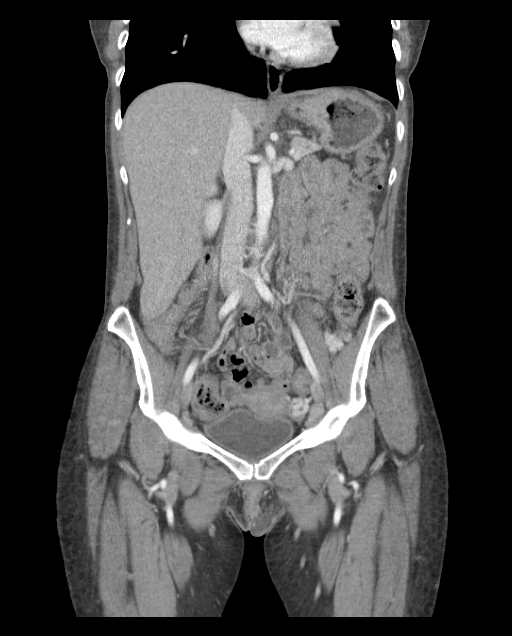
[im 49/88  soft-tissue]
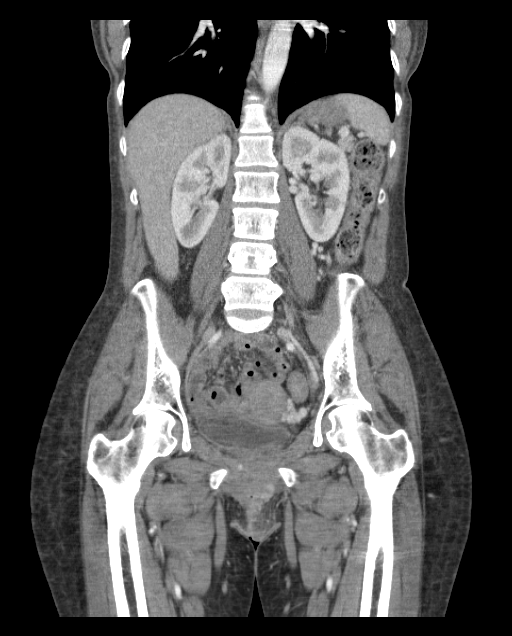

[16 of 46 positions shown; findings below may reference images not displayed]

FINDINGS: LUNG BASES: Included view of the lung bases are clear. Visualized
heart and pericardium are unremarkable.

SOLID ORGANS: The spleen, gallbladder, pancreas and adrenal glands
are unremarkable. The liver is enlarged, 23 cm extending into the
pelvis.

GASTROINTESTINAL TRACT: The stomach, small and large bowel are
normal in course and caliber without inflammatory changes. Normal
appendix.

KIDNEYS/ URINARY TRACT: Kidneys are orthotopic, demonstrating
symmetric enhancement. No nephrolithiasis, hydronephrosis or renal
masses. The unopacified ureters are normal in course and caliber.
Urinary bladder is well distended and unremarkable. Phleboliths in
the pelvis.

PERITONEUM/RETROPERITONEUM: No intraperitoneal free fluid nor free
air. Aortoiliac vessels are normal in course and caliber. No
lymphadenopathy by CT size criteria. Internal reproductive organs
are unremarkable.

SOFT TISSUE/OSSEOUS STRUCTURES: 1.7 x 5.1 x 2.1 cm (transverse by AP
by CC) rim enhancing fluid collection within the left labia/perineum
with surrounding inflammatory changes. No subcutaneous gas or
radiopaque foreign bodies.
IMPRESSION: 1.7 x 5.1 x 2.1 cm rim enhancing fluid collection within the left
labia/ perineum consistent with abscess without CT findings of
necrotizing fasciitis. No intraperitoneal extension.

No acute intra-abdominal or pelvic process.  Hepatomegaly noted.

  By: ROELANDT

## 2013-10-10 MED ORDER — SODIUM CHLORIDE 0.9 % IV SOLN
3.0000 g | Freq: Once | INTRAVENOUS | Status: AC
  Administered 2013-10-10: 3 g via INTRAVENOUS
  Filled 2013-10-10: qty 3

## 2013-10-10 MED ORDER — HYDROMORPHONE HCL PF 1 MG/ML IJ SOLN
1.0000 mg | Freq: Once | INTRAMUSCULAR | Status: AC
  Administered 2013-10-10: 1 mg via INTRAVENOUS
  Filled 2013-10-10: qty 1

## 2013-10-10 MED ORDER — DIPHENHYDRAMINE HCL 50 MG/ML IJ SOLN
12.5000 mg | Freq: Once | INTRAMUSCULAR | Status: AC
  Administered 2013-10-10: 12.5 mg via INTRAVENOUS
  Filled 2013-10-10: qty 1

## 2013-10-10 MED ORDER — VANCOMYCIN HCL IN DEXTROSE 1-5 GM/200ML-% IV SOLN
1000.0000 mg | Freq: Once | INTRAVENOUS | Status: AC
  Administered 2013-10-10: 1000 mg via INTRAVENOUS
  Filled 2013-10-10: qty 200

## 2013-10-10 MED ORDER — SODIUM CHLORIDE 0.9 % IV BOLUS (SEPSIS)
1000.0000 mL | Freq: Once | INTRAVENOUS | Status: AC
  Administered 2013-10-10: 1000 mL via INTRAVENOUS

## 2013-10-10 MED ORDER — IOHEXOL 300 MG/ML  SOLN
100.0000 mL | Freq: Once | INTRAMUSCULAR | Status: AC | PRN
  Administered 2013-10-10: 100 mL via INTRAVENOUS

## 2013-10-10 MED ORDER — FENTANYL CITRATE 0.05 MG/ML IJ SOLN
50.0000 ug | Freq: Once | INTRAMUSCULAR | Status: AC
  Administered 2013-10-10: 50 ug via INTRAVENOUS
  Filled 2013-10-10: qty 2

## 2013-10-10 NOTE — ED Notes (Signed)
Updated patient on plan of care.  Pt verbalized understanding. Pt resting with lights dimmed at this time

## 2013-10-10 NOTE — ED Notes (Signed)
Patient reports some itching from iv contrast. MD made aware. New orders received.

## 2013-10-10 NOTE — ED Notes (Signed)
Two RN's attempted IV start. 2 sticks unsuccessful

## 2014-09-14 ENCOUNTER — Encounter (HOSPITAL_BASED_OUTPATIENT_CLINIC_OR_DEPARTMENT_OTHER): Payer: Self-pay | Admitting: *Deleted

## 2014-09-14 ENCOUNTER — Emergency Department (HOSPITAL_BASED_OUTPATIENT_CLINIC_OR_DEPARTMENT_OTHER)
Admission: EM | Admit: 2014-09-14 | Discharge: 2014-09-14 | Disposition: A | Discharge status: Home / Self Care | Payer: Self-pay | Attending: Emergency Medicine | Admitting: Emergency Medicine

## 2014-09-14 DIAGNOSIS — S60512A Abrasion of left hand, initial encounter: Secondary | ICD-10-CM | POA: Insufficient documentation

## 2014-09-14 DIAGNOSIS — Y9389 Activity, other specified: Secondary | ICD-10-CM | POA: Insufficient documentation

## 2014-09-14 DIAGNOSIS — I10 Essential (primary) hypertension: Secondary | ICD-10-CM | POA: Insufficient documentation

## 2014-09-14 DIAGNOSIS — W260XXA Contact with knife, initial encounter: Secondary | ICD-10-CM | POA: Insufficient documentation

## 2014-09-14 DIAGNOSIS — Y9289 Other specified places as the place of occurrence of the external cause: Secondary | ICD-10-CM | POA: Insufficient documentation

## 2014-09-14 DIAGNOSIS — Y998 Other external cause status: Secondary | ICD-10-CM | POA: Insufficient documentation

## 2014-09-14 DIAGNOSIS — Z79899 Other long term (current) drug therapy: Secondary | ICD-10-CM | POA: Insufficient documentation

## 2014-09-14 DIAGNOSIS — S1081XA Abrasion of other specified part of neck, initial encounter: Secondary | ICD-10-CM | POA: Insufficient documentation

## 2014-09-14 DIAGNOSIS — T148XXA Other injury of unspecified body region, initial encounter: Secondary | ICD-10-CM

## 2014-09-14 DIAGNOSIS — R739 Hyperglycemia, unspecified: Secondary | ICD-10-CM

## 2014-09-14 DIAGNOSIS — E079 Disorder of thyroid, unspecified: Secondary | ICD-10-CM | POA: Insufficient documentation

## 2014-09-14 DIAGNOSIS — L0231 Cutaneous abscess of buttock: Secondary | ICD-10-CM | POA: Insufficient documentation

## 2014-09-14 DIAGNOSIS — L0291 Cutaneous abscess, unspecified: Secondary | ICD-10-CM

## 2014-09-14 DIAGNOSIS — E780 Pure hypercholesterolemia: Secondary | ICD-10-CM | POA: Insufficient documentation

## 2014-09-14 DIAGNOSIS — E1165 Type 2 diabetes mellitus with hyperglycemia: Secondary | ICD-10-CM | POA: Insufficient documentation

## 2014-09-14 DIAGNOSIS — Z794 Long term (current) use of insulin: Secondary | ICD-10-CM | POA: Insufficient documentation

## 2014-09-14 DIAGNOSIS — Z3202 Encounter for pregnancy test, result negative: Secondary | ICD-10-CM | POA: Insufficient documentation

## 2014-09-14 DIAGNOSIS — G40909 Epilepsy, unspecified, not intractable, without status epilepticus: Secondary | ICD-10-CM | POA: Insufficient documentation

## 2014-09-14 DIAGNOSIS — E876 Hypokalemia: Secondary | ICD-10-CM | POA: Insufficient documentation

## 2014-09-14 DIAGNOSIS — F319 Bipolar disorder, unspecified: Secondary | ICD-10-CM | POA: Insufficient documentation

## 2014-09-14 LAB — PREGNANCY, URINE: Preg Test, Ur: NEGATIVE

## 2014-09-14 LAB — URINE MICROSCOPIC-ADD ON

## 2014-09-14 LAB — BASIC METABOLIC PANEL
Anion gap: 12 (ref 5–15)
BUN: 11 mg/dL (ref 6–20)
CO2: 21 mmol/L — AB (ref 22–32)
CREATININE: 0.74 mg/dL (ref 0.44–1.00)
Calcium: 8.9 mg/dL (ref 8.9–10.3)
Chloride: 100 mmol/L — ABNORMAL LOW (ref 101–111)
GFR calc Af Amer: >60 mL/min (ref >60)
GFR calc non Af Amer: >60 mL/min (ref >60)
GLUCOSE: 287 mg/dL — AB (ref 65–99)
Potassium: 2.9 mmol/L — ABNORMAL LOW (ref 3.5–5.1)
SODIUM: 133 mmol/L — AB (ref 135–145)

## 2014-09-14 LAB — URINALYSIS, ROUTINE W REFLEX MICROSCOPIC
Bilirubin Urine: NEGATIVE
Ketones, ur: NEGATIVE mg/dL
Leukocytes, UA: NEGATIVE
Nitrite: NEGATIVE
PH: 5.5 (ref 5.0–8.0)
Protein, ur: 30 mg/dL — AB
Specific Gravity, Urine: 1.028 (ref 1.005–1.030)
Urobilinogen, UA: 0.2 mg/dL (ref 0.0–1.0)

## 2014-09-14 LAB — CBG MONITORING, ED
Glucose-Capillary: 341 mg/dL — ABNORMAL HIGH (ref 65–99)
Glucose-Capillary: 94 mg/dL (ref 65–99)

## 2014-09-14 MED ORDER — SULFAMETHOXAZOLE-TRIMETHOPRIM 800-160 MG PO TABS: 1.0000 | ORAL_TABLET | Freq: Two times a day (BID) | ORAL | Status: AC

## 2014-09-14 MED ORDER — SODIUM CHLORIDE 0.9 % IV BOLUS (SEPSIS)
1000.0000 mL | Freq: Once | INTRAVENOUS | Status: AC
  Administered 2014-09-14: 1000 mL via INTRAVENOUS

## 2014-09-14 MED ORDER — POTASSIUM CHLORIDE CRYS ER 20 MEQ PO TBCR
40.0000 meq | EXTENDED_RELEASE_TABLET | Freq: Once | ORAL | Status: AC
  Administered 2014-09-14: 40 meq via ORAL
  Filled 2014-09-14: qty 2
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
## 2014-09-14 NOTE — Discharge Instructions (Signed)
Abrasion An abrasion is a cut or scrape of the skin. Abrasions do not extend through all layers of the skin and most heal within 10 days. It is important to care for your abrasion properly to prevent infection. CAUSES  Most abrasions are caused by falling on, or gliding across, the ground or other surface. When your skin rubs on something, the outer and inner layer of skin rubs off, causing an abrasion. DIAGNOSIS  Your caregiver will be able to diagnose an abrasion during a physical exam.  TREATMENT  Your treatment depends on how large and deep the abrasion is. Generally, your abrasion will be cleaned with water and a mild soap to remove any dirt or debris. An antibiotic ointment may be put over the abrasion to prevent an infection. A bandage (dressing) may be wrapped around the abrasion to keep it from getting dirty.  You may need a tetanus shot if:  You cannot remember when you had your last tetanus shot.  You have never had a tetanus shot.  The injury broke your skin. If you get a tetanus shot, your arm may swell, get red, and feel warm to the touch. This is common and not a problem. If you need a tetanus shot and you choose not to have one, there is a rare chance of getting tetanus. Sickness from tetanus can be serious.  HOME CARE INSTRUCTIONS   If a dressing was applied, change it at least once a day or as directed by your caregiver. If the bandage sticks, soak it off with warm water.   Wash the area with water and a mild soap to remove all the ointment 2 times a day. Rinse off the soap and pat the area dry with a clean towel.   Reapply any ointment as directed by your caregiver. This will help prevent infection and keep the bandage from sticking. Use gauze over the wound and under the dressing to help keep the bandage from sticking.   Change your dressing right away if it becomes wet or dirty.   Only take over-the-counter or prescription medicines for pain, discomfort, or fever as  directed by your caregiver.   Follow up with your caregiver within 24-48 hours for a wound check, or as directed. If you were not given a wound-check appointment, look closely at your abrasion for redness, swelling, or pus. These are signs of infection. SEEK IMMEDIATE MEDICAL CARE IF:   You have increasing pain in the wound.   You have redness, swelling, or tenderness around the wound.   You have pus coming from the wound.   You have a fever or persistent symptoms for more than 2-3 days.  You have a fever and your symptoms suddenly get worse.  You have a bad smell coming from the wound or dressing.  MAKE SURE YOU:   Understand these instructions.  Will watch your condition.  Will get help right away if you are not doing well or get worse. Document Released: 01/08/2005 Document Revised: 03/17/2012 Document Reviewed: 03/04/2011 [facility] Patient Information 2015 [place], [place]. This information is not intended to replace advice given to you by your health care provider. Make sure you discuss any questions you have with your health care provider.  Hypokalemia Hypokalemia means that the amount of potassium in the blood is lower than normal.Potassium is a chemical, called an electrolyte, that helps regulate the amount of fluid in the body. It also stimulates muscle contraction and helps nerves function properly.Most of the body's potassium  is inside of cells, and only a very small amount is in the blood. Because the amount in the blood is so small, minor changes can be life-threatening. CAUSES  Antibiotics.  Diarrhea or vomiting.  Using laxatives too much, which can cause diarrhea.  Chronic kidney disease.  Water pills (diuretics).  Eating disorders (bulimia).  Low magnesium level.  Sweating a lot. SIGNS AND SYMPTOMS  Weakness.  Constipation.  Fatigue.  Muscle cramps.  Mental confusion.  Skipped heartbeats or irregular heartbeat (palpitations).  Tingling  or numbness. DIAGNOSIS  Your health care provider can diagnose hypokalemia with blood tests. In addition to checking your potassium level, your health care provider may also check other lab tests. TREATMENT Hypokalemia can be treated with potassium supplements taken by mouth or adjustments in your current medicines. If your potassium level is very low, you may need to get potassium through a vein (IV) and be monitored in the hospital. A diet high in potassium is also helpful. Foods high in potassium are:  Nuts, such as peanuts and pistachios.  Seeds, such as sunflower seeds and pumpkin seeds.  Peas, lentils, and lima beans.  Whole grain and bran cereals and breads.  Fresh fruit and vegetables, such as apricots, avocado, bananas, cantaloupe, kiwi, oranges, tomatoes, asparagus, and potatoes.  Orange and tomato juices.  Red meats.  Fruit yogurt. HOME CARE INSTRUCTIONS  Take all medicines as prescribed by your health care provider.  Maintain a healthy diet by including nutritious food, such as fruits, vegetables, nuts, whole grains, and lean meats.  If you are taking a laxative, be sure to follow the directions on the label. SEEK MEDICAL CARE IF:  Your weakness gets worse.  You feel your heart pounding or racing.  You are vomiting or having diarrhea.  You are diabetic and having trouble keeping your blood glucose in the normal range. SEEK IMMEDIATE MEDICAL CARE IF:  You have chest pain, shortness of breath, or dizziness.  You are vomiting or having diarrhea for more than 2 days.  You faint. MAKE SURE YOU:   Understand these instructions.  Will watch your condition.  Will get help right away if you are not doing well or get worse. Document Released: 03/31/2005 Document Revised: 01/19/2013 Document Reviewed: 10/01/2012 [facility] Patient Information 2015 [place], [place]. This information is not intended to replace advice given to you by your health care provider. Make sure  you discuss any questions you have with your health care provider.
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
## 2014-09-30 ENCOUNTER — Encounter (HOSPITAL_BASED_OUTPATIENT_CLINIC_OR_DEPARTMENT_OTHER): Payer: Self-pay | Admitting: *Deleted

## 2014-09-30 ENCOUNTER — Emergency Department (HOSPITAL_BASED_OUTPATIENT_CLINIC_OR_DEPARTMENT_OTHER)
Admission: EM | Admit: 2014-09-30 | Discharge: 2014-09-30 | Disposition: A | Discharge status: Home / Self Care | Payer: Worker's Compensation | Attending: Emergency Medicine | Admitting: Emergency Medicine

## 2014-09-30 ENCOUNTER — Emergency Department (HOSPITAL_BASED_OUTPATIENT_CLINIC_OR_DEPARTMENT_OTHER): Payer: Worker's Compensation

## 2014-09-30 DIAGNOSIS — Y289XXA Contact with unspecified sharp object, undetermined intent, initial encounter: Secondary | ICD-10-CM | POA: Insufficient documentation

## 2014-09-30 DIAGNOSIS — Y998 Other external cause status: Secondary | ICD-10-CM | POA: Insufficient documentation

## 2014-09-30 DIAGNOSIS — E119 Type 2 diabetes mellitus without complications: Secondary | ICD-10-CM | POA: Insufficient documentation

## 2014-09-30 DIAGNOSIS — S61219A Laceration without foreign body of unspecified finger without damage to nail, initial encounter: Secondary | ICD-10-CM

## 2014-09-30 DIAGNOSIS — I10 Essential (primary) hypertension: Secondary | ICD-10-CM | POA: Insufficient documentation

## 2014-09-30 DIAGNOSIS — Z794 Long term (current) use of insulin: Secondary | ICD-10-CM | POA: Insufficient documentation

## 2014-09-30 DIAGNOSIS — F319 Bipolar disorder, unspecified: Secondary | ICD-10-CM | POA: Insufficient documentation

## 2014-09-30 DIAGNOSIS — G40909 Epilepsy, unspecified, not intractable, without status epilepticus: Secondary | ICD-10-CM | POA: Insufficient documentation

## 2014-09-30 DIAGNOSIS — S61215A Laceration without foreign body of left ring finger without damage to nail, initial encounter: Secondary | ICD-10-CM | POA: Insufficient documentation

## 2014-09-30 DIAGNOSIS — Z79899 Other long term (current) drug therapy: Secondary | ICD-10-CM | POA: Insufficient documentation

## 2014-09-30 DIAGNOSIS — E079 Disorder of thyroid, unspecified: Secondary | ICD-10-CM | POA: Insufficient documentation

## 2014-09-30 DIAGNOSIS — Y9289 Other specified places as the place of occurrence of the external cause: Secondary | ICD-10-CM | POA: Insufficient documentation

## 2014-09-30 DIAGNOSIS — Y9389 Activity, other specified: Secondary | ICD-10-CM | POA: Insufficient documentation

## 2014-09-30 DIAGNOSIS — Z720 Tobacco use: Secondary | ICD-10-CM | POA: Insufficient documentation

## 2014-09-30 DIAGNOSIS — E780 Pure hypercholesterolemia: Secondary | ICD-10-CM | POA: Insufficient documentation

## 2014-09-30 IMAGING — DX DG FINGER RING 2+V*L*
3 series · 3 of 3 positions shown · non-contrast
Comparison: None.

CLINICAL DATA: Injury with food process.  Laceration.

EXAM:
LEFT RING FINGER 2+V

[finger ap]
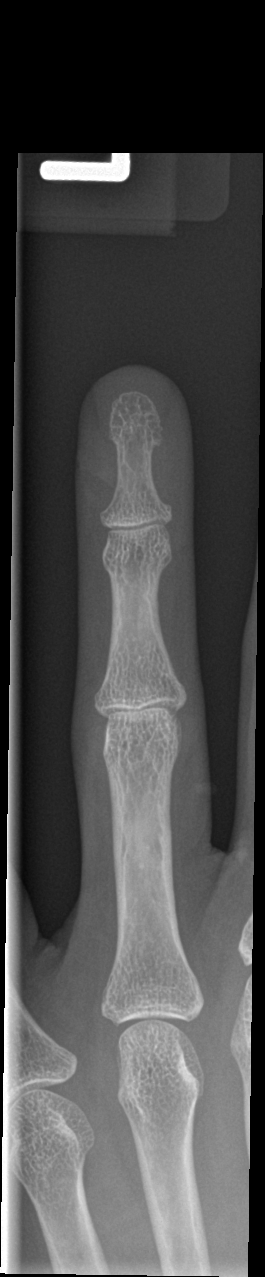

[finger obl]
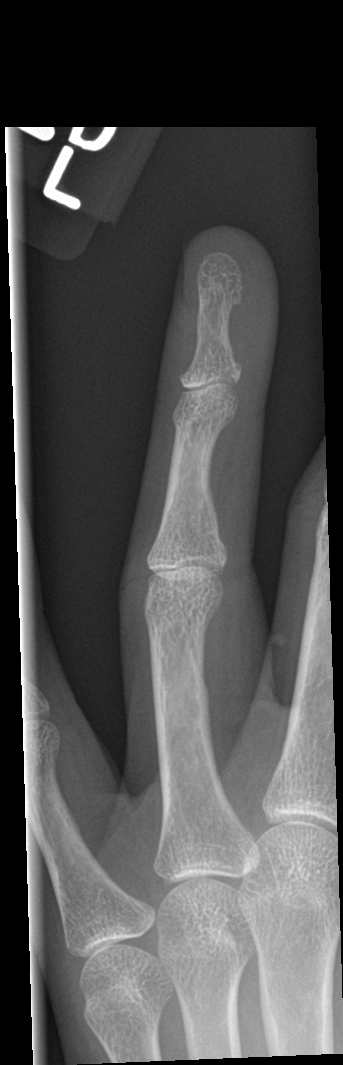

[finger lat]
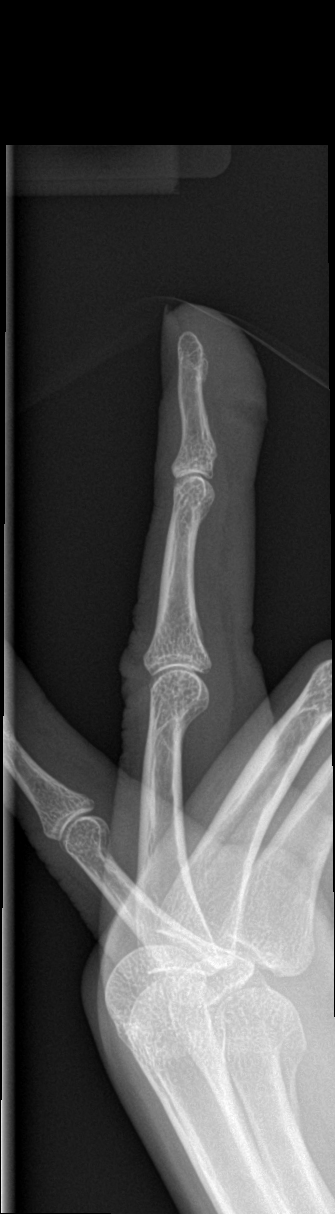

[3 of 3 positions shown; findings below may reference images not displayed]

FINDINGS: There is a laceration along the ventral surface of the distal fourth
digit. No foreign body. No fracture or dislocation.
IMPRESSION: No fracture or foreign body.

## 2014-09-30 MED ORDER — HYDROCODONE-ACETAMINOPHEN 5-325 MG PO TABS
1.0000 | ORAL_TABLET | Freq: Once | ORAL | Status: AC
  Administered 2014-09-30: 1 via ORAL
  Filled 2014-09-30: qty 1

## 2014-09-30 NOTE — ED Notes (Addendum)
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

## 2014-09-30 NOTE — ED Notes (Signed)
Pt has full sensation of Left ringer finger.

## 2014-09-30 NOTE — ED Provider Notes (Signed)
CSN: [id number]     Arrival date & time 09/30/14  1657 History   First MD Initiated Contact with Patient 09/30/14 1702     Chief Complaint  Patient presents with  . Extremity Laceration     (Consider location/radiation/quality/duration/timing/severity/associated sxs/prior Treatment) HPI Comments: Pt comes in with c/o or laceration to the left ring finger after being cut with a blade while pureeing food. She states that she has full sensation. He wrapped the area to get the bleeding controlled. Denies problems with rom. tdap is utd.  The history is provided by the patient. No language interpreter was used.    Past Medical History  Diagnosis Date  . Diabetes mellitus   . Thyroid disease   . Hypertension   . High cholesterol   . Bipolar 1 disorder   . Seizures     last seizure May 2016   Past Surgical History  Procedure Laterality Date  . Tubal ligation    . Ankle surgery     Family History  Problem Relation Age of Onset  . Diabetes Father   . Cancer Maternal Grandfather    History  Substance Use Topics  . Smoking status: Current Some Day Smoker -- 1.00 packs/day    Types: Cigarettes  . Smokeless tobacco: Never Used  . Alcohol Use: No   OB History    No data available     Review of Systems  All other systems reviewed and are negative.     Allergies  Iohexol  Home Medications   Prior to Admission medications   Medication Sig Start Date End Date Taking? Authorizing Provider  ARIPiprazole (ABILIFY) 10 MG tablet Take 10 mg by mouth daily.     Yes Historical Provider, MD  haloperidol (HALDOL) 2 MG tablet Take 2 mg by mouth at bedtime.   Yes Historical Provider, MD  insulin aspart (NOVOLOG) 100 UNIT/ML injection Inject into the skin 3 (three) times daily before meals.   Yes Historical Provider, MD  insulin glargine (LANTUS) 100 UNIT/ML injection Inject 15 Units into the skin daily. 06/22/12  Yes Belkys A Regalado, MD  levothyroxine (SYNTHROID, LEVOTHROID) 125 MCG  tablet Take 125 mcg by mouth daily.     Yes Historical Provider, MD  nicotine (NICOTROL) 10 MG inhaler Inhale 2 puffs into the lungs daily as needed. To stop smoking   Yes Historical Provider, MD  phenytoin (DILANTIN) 100 MG ER capsule Take 200 mg by mouth 2 (two) times daily.    Yes Historical Provider, MD  risperiDONE (RISPERDAL) 0.5 MG tablet Take 0.5 mg by mouth at bedtime.   Yes Historical Provider, MD  simvastatin (ZOCOR) 20 MG tablet Take 20 mg by mouth every evening.   Yes Historical Provider, MD  traZODone (DESYREL) 100 MG tablet Take 100 mg by mouth at bedtime.   Yes Historical Provider, MD  HYDROcodone-acetaminophen (NORCO/VICODIN) 5-325 MG per tablet Take 1 tablet by mouth every 6 (six) hours as needed for pain. 11/06/12   Domenic Moras, PA-C   BP 118/75 mmHg  Pulse 98  Temp(Src) 98.3 F (36.8 C) (Oral)  Resp 16  Ht 5\' 5"  (1.651 m)  Wt 155 lb (70.308 kg)  BMI 25.79 kg/m2  SpO2 100%  LMP 09/14/2014 Physical Exam  Constitutional: She is oriented to person, place, and time. She appears well-developed and well-nourished.  Cardiovascular: Normal rate and regular rhythm.   Pulmonary/Chest: Effort normal and breath sounds normal.  Musculoskeletal: Normal range of motion.  Neurological: She is alert and oriented to  person, place, and time.  Skin:  Multiple lacerations to the distal pad of the left ringer finger with 4 superficial and other being deeper. No nail injury. Cap refill <2 seconds  Nursing note and vitals reviewed.   ED Course  LACERATION REPAIR Date/Time: 09/30/2014 6:11 PM Performed by: Glendell Docker Authorized by: Glendell Docker Consent: Verbal consent obtained. Risks and benefits: risks, benefits and alternatives were discussed Consent given by: patient Patient identity confirmed: verbally with patient Body area: upper extremity Location details: left ring finger Laceration length: 1.5 cm Foreign bodies: no foreign bodies Irrigation solution: saline Skin  closure: glue and Steri-Strips Approximation: close Approximation difficulty: simple Patient tolerance: Patient tolerated the procedure well with no immediate complications   (including critical care time) Labs Review Labs Reviewed - No data to display  Imaging Review Dg Finger Ring Left  09/30/2014   CLINICAL DATA:  Injury with food process.  Laceration.  EXAM: LEFT RING FINGER 2+V  COMPARISON:  None.  FINDINGS: There is a laceration along the ventral surface of the distal fourth digit. No foreign body. No fracture or dislocation.  IMPRESSION: No fracture or foreign body.   Electronically Signed   By: Suzy Bouchard M.D.   On: 09/30/2014 17:52     EKG Interpretation None      MDM   Final diagnoses:  Finger laceration, initial encounter    dermabond and steri stips used and because with multiple wounds concerned that the skin would tear. Discussed wound care and return precautions with pt. No fracture noted    Glendell Docker, NP 09/30/14 1813  Ezequiel Essex, MD 09/30/14 2348

## 2014-09-30 NOTE — ED Notes (Signed)
PA-C in room, temp dsg removed by PA for further examination. Left ring finger noted to have laceration, no further active bleeding noted. PA irrigated injury site.

## 2014-09-30 NOTE — ED Notes (Signed)
Patient transported to X-ray 

## 2014-09-30 NOTE — ED Notes (Signed)
MD at bedside. 

## 2014-09-30 NOTE — ED Notes (Signed)
Here for laceration to left finger

## 2014-09-30 NOTE — Discharge Instructions (Signed)
Laceration Care, Adult °A laceration is a cut or lesion that goes through all layers of the skin and into the tissue just beneath the skin. °TREATMENT  °Some lacerations may not require closure. Some lacerations may not be able to be closed due to an increased risk of infection. It is important to see your caregiver as soon as possible after an injury to minimize the risk of infection and maximize the opportunity for successful closure. °If closure is appropriate, pain medicines may be given, if needed. The wound will be cleaned to help prevent infection. Your caregiver will use stitches (sutures), staples, wound glue (adhesive), or skin adhesive strips to repair the laceration. These tools bring the skin edges together to allow for faster healing and a better cosmetic outcome. However, all wounds will heal with a scar. Once the wound has healed, scarring can be minimized by covering the wound with sunscreen during the day for 1 full year. °HOME CARE INSTRUCTIONS  °For sutures or staples: °· Keep the wound clean and dry. °· If you were given a bandage (dressing), you should change it at least once a day. Also, change the dressing if it becomes wet or dirty, or as directed by your caregiver. °· Wash the wound with soap and water 2 times a day. Rinse the wound off with water to remove all soap. Pat the wound dry with a clean towel. °· After cleaning, apply a thin layer of the antibiotic ointment as recommended by your caregiver. This will help prevent infection and keep the dressing from sticking. °· You may shower as usual after the first 24 hours. Do not soak the wound in water until the sutures are removed. °· Only take over-the-counter or prescription medicines for pain, discomfort, or fever as directed by your caregiver. °· Get your sutures or staples removed as directed by your caregiver. °For skin adhesive strips: °· Keep the wound clean and dry. °· Do not get the skin adhesive strips wet. You may bathe  carefully, using caution to keep the wound dry. °· If the wound gets wet, pat it dry with a clean towel. °· Skin adhesive strips will fall off on their own. You may trim the strips as the wound heals. Do not remove skin adhesive strips that are still stuck to the wound. They will fall off in time. °For wound adhesive: °· You may briefly wet your wound in the shower or bath. Do not soak or scrub the wound. Do not swim. Avoid periods of heavy perspiration until the skin adhesive has fallen off on its own. After showering or bathing, gently pat the wound dry with a clean towel. °· Do not apply liquid medicine, cream medicine, or ointment medicine to your wound while the skin adhesive is in place. This may loosen the film before your wound is healed. °· If a dressing is placed over the wound, be careful not to apply tape directly over the skin adhesive. This may cause the adhesive to be pulled off before the wound is healed. °· Avoid prolonged exposure to sunlight or tanning lamps while the skin adhesive is in place. Exposure to ultraviolet light in the first year will darken the scar. °· The skin adhesive will usually remain in place for 5 to 10 days, then naturally fall off the skin. Do not pick at the adhesive film. °You may need a tetanus shot if: °· You cannot remember when you had your last tetanus shot. °· You have never had a tetanus   shot. If you get a tetanus shot, your arm may swell, get red, and feel warm to the touch. This is common and not a problem. If you need a tetanus shot and you choose not to have one, there is a rare chance of getting tetanus. Sickness from tetanus can be serious. SEEK MEDICAL CARE IF:   You have redness, swelling, or increasing pain in the wound.  You see a red line that goes away from the wound.  You have yellowish-white fluid (pus) coming from the wound.  You have a fever.  You notice a bad smell coming from the wound or dressing.  Your wound breaks open before or  after sutures have been removed.  You notice something coming out of the wound such as wood or glass.  Your wound is on your hand or foot and you cannot move a finger or toe. SEEK IMMEDIATE MEDICAL CARE IF:   Your pain is not controlled with prescribed medicine.  You have severe swelling around the wound causing pain and numbness or a change in color in your arm, hand, leg, or foot.  Your wound splits open and starts bleeding.  You have worsening numbness, weakness, or loss of function of any joint around or beyond the wound.  You develop painful lumps near the wound or on the skin anywhere on your body. MAKE SURE YOU:   Understand these instructions.  Will watch your condition.  Will get help right away if you are not doing well or get worse. Document Released: 03/31/2005 Document Revised: 06/23/2011 Document Reviewed: 09/24/2010 [facility] Patient Information 2015 [place], [place]. This information is not intended to replace advice given to you by your health care provider. Make sure you discuss any questions you have with your health care provider.  Stitches, Staples, or Skin Adhesive Strips  Stitches (sutures), staples, and skin adhesive strips hold the skin together as it heals. They will usually be in place for 7 days or less. HOME CARE  Wash your hands with soap and water before and after you touch your wound.  Only take medicine as told by your doctor.  Cover your wound only if your doctor told you to. Otherwise, leave it open to air.  Do not get your stitches wet or dirty. If they get dirty, dab them gently with a clean washcloth. Wet the washcloth with soapy water. Do not rub. Pat them dry gently.  Do not put medicine or medicated cream on your stitches unless your doctor told you to.  Do not take out your own stitches or staples. Skin adhesive strips will fall off by themselves.  Do not pick at the wound. Picking can cause an infection.  Do not miss your follow-up  appointment.  If you have problems or questions, call your doctor. GET HELP RIGHT AWAY IF:   You have a temperature by mouth above 102 F (38.9 C), not controlled by medicine.  You have chills.  You have redness or pain around your stitches.  There is puffiness (swelling) around your stitches.  You notice fluid (drainage) from your stitches.  There is a bad smell coming from your wound. MAKE SURE YOU:  Understand these instructions.  Will watch your condition.  Will get help if you are not doing well or get worse. Document Released: 01/26/2009 Document Revised: 06/23/2011 Document Reviewed: 01/26/2009 [facility] Patient Information 2015 [place], [place]. This information is not intended to replace advice given to you by your health care provider. Make sure you discuss  any questions you have with your health care provider.  Sterile Tape Wound Care Some cuts and wounds can be closed using sterile tape, also called skin adhesive strips. Skin adhesive strips can be used for shallow (superficial) and simple cuts, wounds, lacerations, and surgical incisions. These strips act in place of stitches to hold the edges of the wound together, allowing for faster healing. Unlike stitches, the adhesive strips do not require needles or anesthetic medicine for placement. The strips will wear off naturally as the wound is healing. It is important to take proper care of your wound at home while it heals.  HOME CARE INSTRUCTIONS  Try to keep the area around your wound clean and dry. Do not allow the adhesive strips to get wet for the first 12 hours.   Do not use any soaps or ointments on the wound for the first 12 hours.   If a bandage (dressing) has been applied, follow your health care provider's instructions for how often to change the dressing. Keep the dressing dry if one has been applied.   Do not remove the adhesive strips. They will fall off on their own. If they do not, you may remove them  gently after 10 days. You should gently wet the strips before removing them. For example, this can be done in the shower.  Do not scratch, pick, or rub the wound area.   Protect the wound from further injury until it is healed.   Protect the wound from sun and tanning bed exposure while it is healing and for several weeks after healing.   Only take over-the-counter or prescription medicines as directed by your health care provider.   Keep all follow-up appointments as directed by your health care provider.  SEEK MEDICAL CARE IF: Your adhesive strips become wet or soaked with blood before the wound has healed. The tape will need to be replaced.  SEEK IMMEDIATE MEDICAL CARE IF:  You have increasing pain in the wound.   You develop a rash after the strips are applied.  Your wound becomes red, swollen, hot, or tender.   You have a red streak that goes away from the wound.   You have pus coming from the wound.   You have increased bleeding from the wound.  You notice a bad smell coming from the wound.   Your wound breaks open. MAKE SURE YOU:  Understand these instructions.  Will watch your condition.  Will get help right away if you are not doing well or get worse. Document Released: 05/08/2004 Document Revised: 01/19/2013 Document Reviewed: 10/20/2012 [facility] Patient Information 2015 [place], [place]. This information is not intended to replace advice given to you by your health care provider. Make sure you discuss any questions you have with your health care provider.

## 2015-01-10 ENCOUNTER — Emergency Department (HOSPITAL_BASED_OUTPATIENT_CLINIC_OR_DEPARTMENT_OTHER)
Admission: EM | Admit: 2015-01-10 | Discharge: 2015-01-10 | Disposition: A | Discharge status: Home / Self Care | Payer: Self-pay | Attending: Emergency Medicine | Admitting: Emergency Medicine

## 2015-01-10 ENCOUNTER — Encounter (HOSPITAL_BASED_OUTPATIENT_CLINIC_OR_DEPARTMENT_OTHER): Payer: Self-pay | Admitting: *Deleted

## 2015-01-10 DIAGNOSIS — E780 Pure hypercholesterolemia: Secondary | ICD-10-CM | POA: Insufficient documentation

## 2015-01-10 DIAGNOSIS — Z794 Long term (current) use of insulin: Secondary | ICD-10-CM | POA: Insufficient documentation

## 2015-01-10 DIAGNOSIS — Z720 Tobacco use: Secondary | ICD-10-CM | POA: Insufficient documentation

## 2015-01-10 DIAGNOSIS — G629 Polyneuropathy, unspecified: Secondary | ICD-10-CM | POA: Insufficient documentation

## 2015-01-10 DIAGNOSIS — R739 Hyperglycemia, unspecified: Secondary | ICD-10-CM

## 2015-01-10 DIAGNOSIS — I10 Essential (primary) hypertension: Secondary | ICD-10-CM | POA: Insufficient documentation

## 2015-01-10 DIAGNOSIS — F319 Bipolar disorder, unspecified: Secondary | ICD-10-CM | POA: Insufficient documentation

## 2015-01-10 DIAGNOSIS — E079 Disorder of thyroid, unspecified: Secondary | ICD-10-CM | POA: Insufficient documentation

## 2015-01-10 DIAGNOSIS — G40909 Epilepsy, unspecified, not intractable, without status epilepticus: Secondary | ICD-10-CM | POA: Insufficient documentation

## 2015-01-10 DIAGNOSIS — E108 Type 1 diabetes mellitus with unspecified complications: Secondary | ICD-10-CM

## 2015-01-10 DIAGNOSIS — E1065 Type 1 diabetes mellitus with hyperglycemia: Secondary | ICD-10-CM | POA: Insufficient documentation

## 2015-01-10 DIAGNOSIS — Z79899 Other long term (current) drug therapy: Secondary | ICD-10-CM | POA: Insufficient documentation

## 2015-01-10 HISTORY — DX: Polyneuropathy, unspecified: G62.9

## 2015-01-10 LAB — CBG MONITORING, ED: Glucose-Capillary: 323 mg/dL — ABNORMAL HIGH (ref 65–99)

## 2015-01-10 MED ORDER — GABAPENTIN 600 MG PO TABS: 600.0000 mg | ORAL_TABLET | Freq: Two times a day (BID) | ORAL | Status: AC

## 2015-01-10 NOTE — Discharge Instructions (Signed)
Please follow-up with your PCP in the next few days to better figure out your sugar control.  Increased your gabapentin dose Below is steps to take if you get hypoglycemic again   Low Blood Sugar Low blood sugar (hypoglycemia) means that the level of sugar in your blood is lower than it should be. Signs of low blood sugar include:  Getting sweaty.  Feeling hungry.  Feeling dizzy or weak.  Feeling sleepier than normal.  Feeling nervous.  Headaches.  Having a fast heartbeat. Low blood sugar can happen fast and can be an emergency. Your doctor can do tests to check your blood sugar level. You can have low blood sugar and not have diabetes. HOME CARE  Check your blood sugar as told by your doctor. If it is less than 70 mg/dl or as told by your doctor, take 1 of the following:  3 to 4 glucose tablets.   cup clear juice.   cup soda pop, not diet.  1 cup milk.  5 to 6 hard candies.  Recheck blood sugar after 15 minutes. Repeat until it is at the right level.  Eat a snack if it is more than 1 hour until the next meal.  Only take medicine as told by your doctor.  Do not skip meals. Eat on time.  Do not drink alcohol except with meals.  Check your blood glucose before driving.  Check your blood glucose before and after exercise.  Always carry treatment with you, such as glucose pills.  Always wear a medical alert bracelet if you have diabetes. GET HELP RIGHT AWAY IF:   Your blood glucose goes below 70 mg/dl or as told by your doctor, and you:  Are confused.  Are not able to swallow.  Pass out (faint).  You cannot treat yourself. You may need someone to help you.  You have low blood sugar problems often.  You have problems from your medicines.  You are not feeling better after 3 to 4 days.  You have vision changes. MAKE SURE YOU:   Understand these instructions.  Will watch this condition.  Will get help right away if you are not doing well or get  worse. Document Released: 06/25/2009 Document Revised: 06/23/2011 Document Reviewed: 06/25/2009 [facility] Patient Information 2015 [place], [place]. This information is not intended to replace advice given to you by your health care provider. Make sure you discuss any questions you have with your health care provider.

## 2015-01-10 NOTE — ED Provider Notes (Signed)
CSN: [id number]     Arrival date & time 01/10/15  1046 History   First MD Initiated Contact with Patient 01/10/15 1111     Chief Complaint  Patient presents with  . Leg Pain  . Hyperglycemia    HPI  Zoe Clay is a 39 y.o. female, with past medical history significant for type 1 diabetes, peripheral neuropathy, bipolar disorder, who presents to ED with complaints of labile blood sugars and bilateral leg pain. Patient states that for the last several years her primary care doctors have been trying to stabilize her blood sugars and bring down her A1c. Patient states she was just recently started on metformin on Friday. Along with her Lantus and regular insulin sliding scale. She has noticed that she has been having wild swings in her glucose. She states that last night her blood sugar dropped to 54. She was able to eat and drink to bring her sugars up. She tried calling her PCPs office during this episode states that she got the on-call nurse. This did not have any additional recommendations to give patient.  Patient's leg pain is bilateral. She does state she has peripheral neuropathy. Describes pain as shooting pain in both her legs. States that she was just started on Lasix for leg swelling and takes gabapentin 300 mg twice daily. Says that she has been on this dose for a long time and does not believe it is helping.  Past Medical History  Diagnosis Date  . Diabetes mellitus   . Thyroid disease   . Hypertension   . High cholesterol   . Bipolar 1 disorder   . Seizures     last seizure May 2016  . Peripheral neuropathy    Past Surgical History  Procedure Laterality Date  . Tubal ligation    . Ankle surgery     Family History  Problem Relation Age of Onset  . Diabetes Father   . Cancer Maternal Grandfather    Social History  Substance Use Topics  . Smoking status: Current Some Day Smoker -- 1.00 packs/day    Types: Cigarettes  . Smokeless tobacco: Never Used  . Alcohol Use:  No   OB History    No data available     Review of Systems  Constitutional: Negative for fever and chills.  Respiratory: Negative for shortness of breath.   Cardiovascular: Negative for chest pain.  Gastrointestinal: Negative for nausea, vomiting and abdominal pain.  Endocrine: Negative for polydipsia and polyuria.  Genitourinary: Negative for dysuria.  Also per HPI  Allergies  Iohexol  Home Medications   Prior to Admission medications   Medication Sig Start Date End Date Taking? Authorizing Provider  ARIPiprazole (ABILIFY) 10 MG tablet Take 10 mg by mouth daily.     Yes Historical Provider, MD  DULoxetine (CYMBALTA) 60 MG capsule Take 60 mg by mouth daily.   Yes Historical Provider, MD  insulin aspart (NOVOLOG) 100 UNIT/ML injection Inject into the skin 3 (three) times daily before meals.   Yes Historical Provider, MD  insulin glargine (LANTUS) 100 UNIT/ML injection Inject 15 Units into the skin daily. 06/22/12  Yes Belkys A Regalado, MD  levothyroxine (SYNTHROID, LEVOTHROID) 125 MCG tablet Take 125 mcg by mouth daily.     Yes Historical Provider, MD  metFORMIN (GLUCOPHAGE) 500 MG tablet Take by mouth 2 (two) times daily with a meal.   Yes Historical Provider, MD  phenytoin (DILANTIN) 100 MG ER capsule Take 200 mg by mouth 2 (two) times  daily.    Yes Historical Provider, MD  risperiDONE (RISPERDAL) 0.5 MG tablet Take 0.5 mg by mouth at bedtime.   Yes Historical Provider, MD  simvastatin (ZOCOR) 20 MG tablet Take 20 mg by mouth every evening.   Yes Historical Provider, MD  traZODone (DESYREL) 100 MG tablet Take 100 mg by mouth at bedtime.   Yes Historical Provider, MD  gabapentin (NEURONTIN) 600 MG tablet Take 1 tablet (600 mg total) by mouth 2 (two) times daily. 01/10/15   Katheren Shams, DO  haloperidol (HALDOL) 2 MG tablet Take 2 mg by mouth at bedtime.    Historical Provider, MD  HYDROcodone-acetaminophen (NORCO/VICODIN) 5-325 MG per tablet Take 1 tablet by mouth every 6 (six)  hours as needed for pain. 11/06/12   Domenic Moras, PA-C  nicotine (NICOTROL) 10 MG inhaler Inhale 2 puffs into the lungs daily as needed. To stop smoking    Historical Provider, MD   BP 118/86 mmHg  Pulse 94  Temp(Src) 98.2 F (36.8 C) (Oral)  Resp 18  Ht 5\' 5"  (1.651 m)  Wt 155 lb (70.308 kg)  BMI 25.79 kg/m2  SpO2 99%  LMP 12/13/2014 Physical Exam  Constitutional: She appears well-developed and well-nourished. No distress.  HENT:  Head: Normocephalic and atraumatic.  Mouth/Throat: Oropharynx is clear and moist.  Eyes: EOM are normal. Pupils are equal, round, and reactive to light.  Neck: Normal range of motion.  Cardiovascular: Normal rate, regular rhythm and intact distal pulses.   Pulmonary/Chest: Effort normal and breath sounds normal.  Abdominal: Soft. Bowel sounds are normal. There is no tenderness.  Musculoskeletal: She exhibits edema.  No-pitting edema up to mid-calf.   Neurological: She is alert.  Skin: Skin is warm and dry.  Psychiatric: She has a normal mood and affect.    ED Course  Procedures (including critical care time) Labs Review Labs Reviewed  CBG MONITORING, ED - Abnormal; Notable for the following:    Glucose-Capillary 323 (*)    All other components within normal limits    Imaging Review No results found. I have personally reviewed and evaluated these images and lab results as part of my medical decision-making.   EKG Interpretation None     MDM   Final diagnoses:  Hyperglycemia  Peripheral neuropathy  Type 1 diabetes mellitus with complications    Patient presented to the ED with bilateral leg pain and labile blood sugars. Patient's blood sugars appear to be hard to control at baseline. CBG in ED was 323. Will not adjust any medications at this time. Patient encouraged to follow-up with PCP as she needs to be monitored closely with any regimen changes. Discussed hypoglycemia treatment measures and handout given. Believe her leg pain is her  normal neuropathy. Increased her Neurontin dose.    Luiz Blare, DO 01/10/2015, 3:32 PM PGY-2, [place], DO 01/10/15 1534  Blanchie Dessert, MD 01/10/15 1540

## 2015-01-10 NOTE — ED Notes (Signed)
Pt reports she has hx of diabetes, has been on insulin and was started on metformin last Friday. States since then her blood sugar has been very hard to control. States her blood sugar was 54 around 1030 last night. She ate to bring it up and it was 520 this morning. State she took 8 units insulin this morning and tried to arrange to see her PCP but they couldn't see her today. Reports she is also having shooting pains in both her legs

## 2015-01-10 NOTE — ED Notes (Signed)
[place]ett MD at bedside. 

## 2015-01-10 NOTE — ED Notes (Signed)
Resident MD at bedside.

## 2015-03-07 ENCOUNTER — Emergency Department (HOSPITAL_BASED_OUTPATIENT_CLINIC_OR_DEPARTMENT_OTHER)
Admission: EM | Admit: 2015-03-07 | Discharge: 2015-03-07 | Disposition: A | Discharge status: Home / Self Care | Payer: Self-pay | Attending: Emergency Medicine | Admitting: Emergency Medicine

## 2015-03-07 ENCOUNTER — Encounter (HOSPITAL_BASED_OUTPATIENT_CLINIC_OR_DEPARTMENT_OTHER): Payer: Self-pay | Admitting: Emergency Medicine

## 2015-03-07 DIAGNOSIS — E119 Type 2 diabetes mellitus without complications: Secondary | ICD-10-CM | POA: Insufficient documentation

## 2015-03-07 DIAGNOSIS — Z794 Long term (current) use of insulin: Secondary | ICD-10-CM | POA: Insufficient documentation

## 2015-03-07 DIAGNOSIS — Z79899 Other long term (current) drug therapy: Secondary | ICD-10-CM | POA: Insufficient documentation

## 2015-03-07 DIAGNOSIS — R000 Tachycardia, unspecified: Secondary | ICD-10-CM | POA: Insufficient documentation

## 2015-03-07 DIAGNOSIS — G629 Polyneuropathy, unspecified: Secondary | ICD-10-CM | POA: Insufficient documentation

## 2015-03-07 DIAGNOSIS — Z7984 Long term (current) use of oral hypoglycemic drugs: Secondary | ICD-10-CM | POA: Insufficient documentation

## 2015-03-07 DIAGNOSIS — E7800 Pure hypercholesterolemia, unspecified: Secondary | ICD-10-CM | POA: Insufficient documentation

## 2015-03-07 DIAGNOSIS — F319 Bipolar disorder, unspecified: Secondary | ICD-10-CM | POA: Insufficient documentation

## 2015-03-07 DIAGNOSIS — K029 Dental caries, unspecified: Secondary | ICD-10-CM | POA: Insufficient documentation

## 2015-03-07 DIAGNOSIS — I10 Essential (primary) hypertension: Secondary | ICD-10-CM | POA: Insufficient documentation

## 2015-03-07 DIAGNOSIS — E079 Disorder of thyroid, unspecified: Secondary | ICD-10-CM | POA: Insufficient documentation

## 2015-03-07 DIAGNOSIS — F17210 Nicotine dependence, cigarettes, uncomplicated: Secondary | ICD-10-CM | POA: Insufficient documentation

## 2015-03-07 MED ORDER — OXYCODONE-ACETAMINOPHEN 5-325 MG PO TABS: 1.0000 | ORAL_TABLET | ORAL | Status: DC | PRN

## 2015-03-07 MED ORDER — PENICILLIN V POTASSIUM 500 MG PO TABS: 500.0000 mg | ORAL_TABLET | Freq: Four times a day (QID) | ORAL | Status: AC

## 2015-03-07 MED ORDER — OXYCODONE-ACETAMINOPHEN 5-325 MG PO TABS
1.0000 | ORAL_TABLET | Freq: Once | ORAL | Status: AC
  Administered 2015-03-07: 1 via ORAL
  Filled 2015-03-07: qty 1

## 2015-03-07 MED ORDER — IBUPROFEN 800 MG PO TABS: 800.0000 mg | ORAL_TABLET | Freq: Three times a day (TID) | ORAL | Status: DC

## 2015-03-07 NOTE — Discharge Instructions (Signed)
You have been seen today for dental pain. Take the antibiotic in its entirety Follow up with PCP as needed. Return to ED should symptoms worsen.   Emergency Department Resource Guide 1) Find a Doctor and Pay Out of Pocket Although you won't have to find out who is covered by your insurance plan, it is a good idea to ask around and get recommendations. You will then need to call the office and see if the doctor you have chosen will accept you as a new patient and what types of options they offer for patients who are self-pay. Some doctors offer discounts or will set up payment plans for their patients who do not have insurance, but you will need to ask so you aren't surprised when you get to your appointment.  2) Contact Your Local Health Department Not all health departments have doctors that can see patients for sick visits, but many do, so it is worth a call to see if yours does. If you don't know where your local health department is, you can check in your phone book. The CDC also has a tool to help you locate your state's health department, and many state websites also have listings of all of their local health departments.  3) Find a [facility] If your illness is not likely to be very severe or complicated, you may want to try a walk in clinic. These are popping up all over the country in pharmacies, drugstores, and shopping centers. They're usually staffed by nurse practitioners or physician assistants that have been trained to treat common illnesses and complaints. They're usually fairly quick and inexpensive. However, if you have serious medical issues or chronic medical problems, these are probably not your best option.  No Primary Care Doctor: - Call Health Connect at  [phone] - they can help you locate a primary care doctor that  accepts your insurance, provides certain services, etc. - Physician Referral Service- [phone]  Chronic Pain Problems: Organization          Address  Phone   Notes  [facility]  [phone] Patients need to be referred by their primary care doctor.   Medication Assistance: Organization         Address  Phone   Notes  [facility] Medication [facility] [address] [place], [place] [address] [phone] --Must be a resident of [facility] -- Must have NO insurance coverage whatsoever (no Medicaid/ Medicare, etc.) -- The pt. MUST have a primary care doctor that directs their care regularly and follows them in the community   [facility]ist  [phone]   [employer]  [phone]    Agencies that provide inexpensive medical care: Organization         Address  Phone   Notes  [place]  [phone]   [facility]    [phone]   [facility] [place], [place] [address] [phone]   [place] [address], [place] [phone]   Planned Parenthood    [phone]   [facility]    [phone]   [place] and [place] [address], [place] Phone:  [phone], Fax:  [phone] Hours of Operation:  9 am - 6 pm, M-F.  Also accepts Medicaid/Medicare and self-pay.  [facility] for [place] [address], [address],  [place] Phone: [phone], Fax: [phone]. Hours of Operation:  8:30 am - 5:30 pm, M-F.  Also accepts Medicaid and self-pay.  [facility] [address], [place] Phone: [phone]   [place], [place], [place] [phone], Ext. [phone] Mondays & Thursdays: 7-9 AM.  First 15 patients are seen on a first come, first serve basis.    [place] Providers:  Organization         Address  Phone   Notes  [facility] [address], [place] [phone] Also accepts self-pay patients.  [facility] [address], [place]  [phone]   [place], [address], [place] [phone]   [facility] [address], [place] [phone]   Lucianne Lei [address], [place]   [phone] Only accepts [place] Access [place] patients after they have their name applied to their card.   Self-Pay (no insurance) in [facility]:  Organization         Address  Phone   Notes  Sickle Cell Patients, [facility] [place] [phone]   [facility] [place] [phone]   [facility] [place]  [place], [place], [place] [phone]   [facility]/Dr. Osei-Bonsu  [address] [address], [place] or [address], [place] [phone] Phone number for both [place] and [place] locations is the same.  [facility]gent [facility] and [facility] [address], [place] [phone]   [facility] [address] [place] or [address] [phone]   [facility] [address], [place] [phone], phone; [phone], fax Sees patients 1st and 3rd Saturday of every month.  Must not qualify for public or private insurance (i.e. Medicaid, Medicare, [place] Health Choice, Veterans' Benefits)  Household income should be no more than 200% of the poverty level The clinic cannot treat you if you are pregnant or think you are pregnant  Sexually transmitted diseases are not treated at the clinic.    Dental Care: Organization         Address  Phone  Notes  [facility] Department of [facility] [phone] Accepts children up to age 47 who are enrolled in [place] or [place]; pregnant women with a Medicaid card; and  children who have applied for Medicaid or [place] Health Choice, but were declined, whose parents can pay a reduced fee at time of service.  [facility] Department of Operating Room Services  [address], [place] [phone] Accepts children up to age 20 who are enrolled in [place] or [place]; pregnant women with a Medicaid card; and children who have applied for Medicaid or [place] Health Choice, but were declined, whose parents can pay a reduced fee at time of service.  [place] Access PRO[facility]  [place] [phone] Patients are seen by appointment only. Walk-ins are not accepted. [place] will see patients 24 years of age and older. Monday - Tuesday (8am-5pm) Most Wednesdays (8:30-5pm) $30 per visit, cash only  [place]  [place]  [address], [facility] [phone] Patients are seen by appointment only. Walk-ins are not accepted. [place] will see patients 60 years of age and older. One Wednesday Evening (Monthly: Volunteer Based).  $30 per visit, cash only  [place]  [phone] for adults; Children under age 70, call Graduate Pediatric Dentistry at [phone]. Children aged 74-14, please call [phone] to request a pediatric application.  Dental services are provided in all areas of dental care including fillings, crowns and bridges, complete and partial dentures, implants, gum treatment, root canals, and extractions. Preventive care is also provided. Treatment is provided to both adults and children. Patients are selected via a lottery and there is often a waiting list.   [facility] [address] [place]  [phone] [email or web address]   [facility] [address] [place], [place] [phone], Ext. [phone] Second and Fourth Thursday of each month, opens at 6:30 AM; Clinic ends at 9 AM.  Patients are seen on a first-come first-served  basis, and a limited number are seen during each clinic.   [facility]  [address] Hillard Danker [place], [place] [phone]   Eligibility Requirements You must have lived in [place], [place], or [place] counties for at least the last three months.   You cannot be eligible for state or federal sponsored [employer], including [employer], [place], or [employer].   You generally cannot be eligible for healthcare insurance through your employer.    How to apply: Eligibility screenings are held every Tuesday and Wednesday afternoon from 1:00 pm until 4:00 pm. You do not need an appointment for the interview!  [facility] [address] [place], [place]   [place]  [place] Department  [place]  [phone]    Behavioral Health Resources in the Community: Intensive Outpatient Programs Organization         Address  Phone  Notes  [place]is Vancleave. [address] [place], [place] [phone]   [facility] Outpatient [address] [place], [place]   ADS: Alcohol & Drug Svcs [address] [place], [place]   [place] [address],  [place], [place] or [phone]   Substance Abuse Resources Organization         Address  Phone  Notes  Alcohol and Drug Services  [phone]   [place]  [phone]   [facility] [place]   Chinita Pester  [phone]   Residential & Outpatient Substance Abuse Program  [phone]   Psychological Services Organization         Address  Phone  Notes  [facility] [place]  [place]  [phone]   [place] [address], [place] or [phone]    Mobile Crisis Teams Organization          Address  Phone  Notes  [facility], Mobile Crisis Care Unit  [phone]   [facility]  [address] [place], [place]   [place] [address], [place] [phone]    Self-Help/Support Groups Organization         Address  Phone             Notes  [place]. of [place] - variety  of support groups  336- ([phone] Call for more information  Narcotics Anonymous (NA), [facility] [address], [employer] [place]  2 meetings at this location   Residential Facilities manager         Address  Phone  Notes  [facility] [place],    [place]  [phone]   [facility]  [address], [place] [address], [place], [place]   [place], [place] [phone] Admissions: 8am-3pm M-F  Incentives Substance [place] [address]    [place], [place] [phone]   [facility] [address] [place], [place], [place]   [facility] [address],  [place], [place]   [facility] Programs - Intensive Outpatient [address] Kristeen [address], [place], [place]   [facility] ([place].,  [place], [place] [phone] or [phone]   [facility]idential Treatment Services (RTS) [address] [place], [place] Accepts Medicaid  [facility] [place] [address],  [place] [phone] Substance Abuse/Addiction Treatment   [facility] Organization         Address  Phone  Notes  [facility]  [phone]   Domenic Schwab, PhD [address] Arlis Porta [place], [place]   [phone] or [phone]   [place], [place] [phone]   [facility] [address], [place], [place] [phone] Insurance/Medicaid/sponsorship  through [facility] [place] and Families [address] [place]                                    [place], [place] [phone] [place] [address], [place] [phone]    Dr. Adele Schilder  [phone]   [facility] Dept. 1) [address], [place] 2) [place] 3)  [place] [address], [place] [phone]  [phone]   [place] [phone] or [phone] (After Hours)

## 2015-03-07 NOTE — ED Provider Notes (Signed)
CSN: [id number]     Arrival date & time 03/07/15  1750 History   First MD Initiated Contact with Patient 03/07/15 1800     Chief Complaint  Patient presents with  . Dental Pain     (Consider location/radiation/quality/duration/timing/severity/associated sxs/prior Treatment) HPI   Zoe Clay is a 39 y.o. female, with a history of hypertension, DM, and bipolar, presenting to the ED with dental pain in the left lower jaw beginning three days ago. Pt describes the pain as throbbing, 8/10, radiating along her jaw. Pt has tried tramadol and ibuprofen, with minimal relief. Pt has an appointment next Thursday with a dentist, but states she can not sleep because of the pain. Pt denies headache, fever/chills, N/V, trouble swallowing, shortness of breath, or any other pain or complaints.   Past Medical History  Diagnosis Date  . Diabetes mellitus   . Thyroid disease   . Hypertension   . High cholesterol   . Bipolar 1 disorder ([place])   . Seizures ([place])     last seizure May 2016  . Peripheral neuropathy [facility])    Past Surgical History  Procedure Laterality Date  . Tubal ligation    . Ankle surgery     Family History  Problem Relation Age of Onset  . Diabetes Father   . Cancer Maternal Grandfather    Social History  Substance Use Topics  . Smoking status: Current Some Day Smoker -- 1.00 packs/day    Types: Cigarettes  . Smokeless tobacco: Never Used  . Alcohol Use: No   OB History    No data available     Review of Systems  Constitutional: Negative for fever, chills and diaphoresis.  HENT: Positive for dental problem.   Respiratory: Negative for choking, shortness of breath and stridor.   Cardiovascular: Negative for chest pain.  Gastrointestinal: Negative for nausea and vomiting.  Neurological: Negative for dizziness, syncope, speech difficulty, light-headedness, numbness and headaches.  Hematological: Negative for adenopathy.  All other systems reviewed and are  negative.     Allergies  Iohexol  Home Medications   Prior to Admission medications   Medication Sig Start Date End Date Taking? Authorizing Provider  ARIPiprazole (ABILIFY) 10 MG tablet Take 10 mg by mouth daily.      Historical Provider, MD  DULoxetine (CYMBALTA) 60 MG capsule Take 60 mg by mouth daily.    Historical Provider, MD  gabapentin (NEURONTIN) 600 MG tablet Take 1 tablet (600 mg total) by mouth 2 (two) times daily. 01/10/15   Katheren Shams, DO  haloperidol (HALDOL) 2 MG tablet Take 2 mg by mouth at bedtime.    Historical Provider, MD  HYDROcodone-acetaminophen (NORCO/VICODIN) 5-325 MG per tablet Take 1 tablet by mouth every 6 (six) hours as needed for pain. 11/06/12   Domenic Moras, PA-C  ibuprofen (ADVIL,MOTRIN) 800 MG tablet Take 1 tablet (800 mg total) by mouth 3 (three) times daily. 03/07/15   Shawn C Joy, PA-C  insulin aspart (NOVOLOG) 100 UNIT/ML injection Inject into the skin 3 (three) times daily before meals.    Historical Provider, MD  insulin glargine (LANTUS) 100 UNIT/ML injection Inject 15 Units into the skin daily. 06/22/12   Belkys A Regalado, MD  levothyroxine (SYNTHROID, LEVOTHROID) 125 MCG tablet Take 125 mcg by mouth daily.      Historical Provider, MD  metFORMIN (GLUCOPHAGE) 500 MG tablet Take by mouth 2 (two) times daily with a meal.    Historical Provider, MD  nicotine (NICOTROL) 10 MG inhaler  Inhale 2 puffs into the lungs daily as needed. To stop smoking    Historical Provider, MD  oxyCODONE-acetaminophen (PERCOCET/ROXICET) 5-325 MG tablet Take 1-2 tablets by mouth every 4 (four) hours as needed for severe pain. 03/07/15   Shawn C Joy, PA-C  penicillin v potassium (VEETID) 500 MG tablet Take 1 tablet (500 mg total) by mouth 4 (four) times daily. 03/07/15 03/14/15  Shawn C Joy, PA-C  phenytoin (DILANTIN) 100 MG ER capsule Take 200 mg by mouth 2 (two) times daily.     Historical Provider, MD  risperiDONE (RISPERDAL) 0.5 MG tablet Take 0.5 mg by mouth at bedtime.     Historical Provider, MD  simvastatin (ZOCOR) 20 MG tablet Take 20 mg by mouth every evening.    Historical Provider, MD  traZODone (DESYREL) 100 MG tablet Take 100 mg by mouth at bedtime.    Historical Provider, MD   BP 132/81 mmHg  Pulse 109  Temp(Src) 97.7 F (36.5 C) (Oral)  Resp 18  Ht 5\' 5"  (1.651 m)  Wt 66.679 kg  BMI 24.46 kg/m2  SpO2 100% Physical Exam  Constitutional: She appears well-developed and well-nourished. No distress.  HENT:  Head: Normocephalic and atraumatic.  Significant dental caries noted in left lower molar. Tooth is almost completely worn away and black in color. No fluctuant masses or discharge noted. Uvula is midline. No trismus. Swallows with no trouble.   Eyes: Conjunctivae are normal. Pupils are equal, round, and reactive to light.  Cardiovascular: Regular rhythm and normal heart sounds.  Tachycardia present.   Pulmonary/Chest: Effort normal and breath sounds normal. No respiratory distress.  Abdominal: Soft. Bowel sounds are normal.  Musculoskeletal: She exhibits no edema or tenderness.  Neurological: She is alert.  Skin: Skin is warm and dry. She is not diaphoretic.  Nursing note and vitals reviewed.   ED Course  Procedures (including critical care time) Labs Review Labs Reviewed - No data to display  Imaging Review No results found. I have personally reviewed and evaluated these images and lab results as part of my medical decision-making.   EKG Interpretation None      MDM   Final diagnoses:  Pain due to dental caries    Bing Plume presents with dental pain.  Patient already has a dentist appointment set up. Do not suspect Ludwig's edema. No evidence of an abscess that can be I&D. Patient given some pain medicine and ibuprofen for comfort until her dentist appointment. Patient was told to be sure to keep her dentist appointment and that the antibiotics will not cure the source of the pain. Patient voiced understanding of  these instructions, agreed to the plan of care, and is comfortable with discharge.    Lorayne Bender, PA-C 03/07/15 1848  Virgel Manifold, MD 03/16/15 [phone]

## 2015-03-07 NOTE — ED Notes (Signed)
Patient states that she is having pain to her left upper tooth

## 2015-05-01 ENCOUNTER — Emergency Department (HOSPITAL_BASED_OUTPATIENT_CLINIC_OR_DEPARTMENT_OTHER): Payer: Self-pay

## 2015-05-01 ENCOUNTER — Emergency Department (HOSPITAL_BASED_OUTPATIENT_CLINIC_OR_DEPARTMENT_OTHER)
Admission: EM | Admit: 2015-05-01 | Discharge: 2015-05-01 | Disposition: A | Discharge status: Home / Self Care | Payer: Self-pay | Attending: Emergency Medicine | Admitting: Emergency Medicine

## 2015-05-01 ENCOUNTER — Encounter (HOSPITAL_BASED_OUTPATIENT_CLINIC_OR_DEPARTMENT_OTHER): Payer: Self-pay | Admitting: *Deleted

## 2015-05-01 DIAGNOSIS — E119 Type 2 diabetes mellitus without complications: Secondary | ICD-10-CM | POA: Insufficient documentation

## 2015-05-01 DIAGNOSIS — F17210 Nicotine dependence, cigarettes, uncomplicated: Secondary | ICD-10-CM | POA: Insufficient documentation

## 2015-05-01 DIAGNOSIS — Y998 Other external cause status: Secondary | ICD-10-CM | POA: Insufficient documentation

## 2015-05-01 DIAGNOSIS — M7989 Other specified soft tissue disorders: Secondary | ICD-10-CM | POA: Insufficient documentation

## 2015-05-01 DIAGNOSIS — G629 Polyneuropathy, unspecified: Secondary | ICD-10-CM | POA: Insufficient documentation

## 2015-05-01 DIAGNOSIS — R140 Abdominal distension (gaseous): Secondary | ICD-10-CM

## 2015-05-01 DIAGNOSIS — R110 Nausea: Secondary | ICD-10-CM | POA: Insufficient documentation

## 2015-05-01 DIAGNOSIS — Z79899 Other long term (current) drug therapy: Secondary | ICD-10-CM | POA: Insufficient documentation

## 2015-05-01 DIAGNOSIS — Z794 Long term (current) use of insulin: Secondary | ICD-10-CM | POA: Insufficient documentation

## 2015-05-01 DIAGNOSIS — F319 Bipolar disorder, unspecified: Secondary | ICD-10-CM | POA: Insufficient documentation

## 2015-05-01 DIAGNOSIS — Z7984 Long term (current) use of oral hypoglycemic drugs: Secondary | ICD-10-CM | POA: Insufficient documentation

## 2015-05-01 DIAGNOSIS — X58XXXA Exposure to other specified factors, initial encounter: Secondary | ICD-10-CM | POA: Insufficient documentation

## 2015-05-01 DIAGNOSIS — Z3202 Encounter for pregnancy test, result negative: Secondary | ICD-10-CM | POA: Insufficient documentation

## 2015-05-01 DIAGNOSIS — Z791 Long term (current) use of non-steroidal anti-inflammatories (NSAID): Secondary | ICD-10-CM | POA: Insufficient documentation

## 2015-05-01 DIAGNOSIS — I10 Essential (primary) hypertension: Secondary | ICD-10-CM | POA: Insufficient documentation

## 2015-05-01 DIAGNOSIS — Y9389 Activity, other specified: Secondary | ICD-10-CM | POA: Insufficient documentation

## 2015-05-01 DIAGNOSIS — R109 Unspecified abdominal pain: Secondary | ICD-10-CM | POA: Insufficient documentation

## 2015-05-01 DIAGNOSIS — S39012A Strain of muscle, fascia and tendon of lower back, initial encounter: Secondary | ICD-10-CM | POA: Insufficient documentation

## 2015-05-01 DIAGNOSIS — Y9289 Other specified places as the place of occurrence of the external cause: Secondary | ICD-10-CM | POA: Insufficient documentation

## 2015-05-01 DIAGNOSIS — E7800 Pure hypercholesterolemia, unspecified: Secondary | ICD-10-CM | POA: Insufficient documentation

## 2015-05-01 DIAGNOSIS — E079 Disorder of thyroid, unspecified: Secondary | ICD-10-CM | POA: Insufficient documentation

## 2015-05-01 LAB — COMPREHENSIVE METABOLIC PANEL
ALK PHOS: 132 U/L — AB (ref 38–126)
ALT: 15 U/L (ref 14–54)
AST: 26 U/L (ref 15–41)
Albumin: 3.8 g/dL (ref 3.5–5.0)
Anion gap: 11 (ref 5–15)
BUN: 15 mg/dL (ref 6–20)
CALCIUM: 9.1 mg/dL (ref 8.9–10.3)
CO2: 24 mmol/L (ref 22–32)
CREATININE: 0.56 mg/dL (ref 0.44–1.00)
Chloride: 103 mmol/L (ref 101–111)
GFR calc Af Amer: >60 mL/min (ref >60)
Glucose, Bld: 89 mg/dL (ref 65–99)
Potassium: 4.5 mmol/L (ref 3.5–5.1)
Sodium: 138 mmol/L (ref 135–145)
Total Bilirubin: 0.4 mg/dL (ref 0.3–1.2)
Total Protein: 7.1 g/dL (ref 6.5–8.1)

## 2015-05-01 LAB — CBC WITH DIFFERENTIAL/PLATELET
BASOS ABS: 0 10*3/uL (ref 0.0–0.1)
Basophils Relative: 0 %
Eosinophils Absolute: 0.1 10*3/uL (ref 0.0–0.7)
Eosinophils Relative: 1 %
HEMATOCRIT: 35.6 % — AB (ref 36.0–46.0)
HEMOGLOBIN: 12 g/dL (ref 12.0–15.0)
LYMPHS PCT: 44 %
Lymphs Abs: 3.3 10*3/uL (ref 0.7–4.0)
MCH: 30.5 pg (ref 26.0–34.0)
MCHC: 33.7 g/dL (ref 30.0–36.0)
MCV: 90.6 fL (ref 78.0–100.0)
Monocytes Absolute: 0.5 10*3/uL (ref 0.1–1.0)
Monocytes Relative: 7 %
NEUTROS ABS: 3.6 10*3/uL (ref 1.7–7.7)
NEUTROS PCT: 48 %
Platelets: 396 10*3/uL (ref 150–400)
RBC: 3.93 MIL/uL (ref 3.87–5.11)
RDW: 15.4 % (ref 11.5–15.5)
WBC: 7.4 10*3/uL (ref 4.0–10.5)

## 2015-05-01 LAB — URINALYSIS, ROUTINE W REFLEX MICROSCOPIC
Bilirubin Urine: NEGATIVE
Glucose, UA: 500 mg/dL — AB
Hgb urine dipstick: NEGATIVE
Ketones, ur: 15 mg/dL — AB
LEUKOCYTES UA: NEGATIVE
NITRITE: NEGATIVE
Protein, ur: 30 mg/dL — AB
SPECIFIC GRAVITY, URINE: 1.028 (ref 1.005–1.030)
pH: 6 (ref 5.0–8.0)

## 2015-05-01 LAB — URINE MICROSCOPIC-ADD ON

## 2015-05-01 LAB — LIPASE, BLOOD: LIPASE: 13 U/L (ref 11–51)

## 2015-05-01 LAB — PHENYTOIN LEVEL, TOTAL: Phenytoin Lvl: <2.5 ug/mL — ABNORMAL LOW (ref 10.0–20.0)

## 2015-05-01 LAB — PREGNANCY, URINE: PREG TEST UR: NEGATIVE

## 2015-05-01 IMAGING — CR DG ABDOMEN ACUTE W/ 1V CHEST
3 series · 3 of 3 positions shown · non-contrast
Comparison: [DATE]

CLINICAL DATA: Abdominal bloating, nausea, lower back pain for 2
months

EXAM:
DG ABDOMEN ACUTE W/ 1V CHEST

[w chest pa]
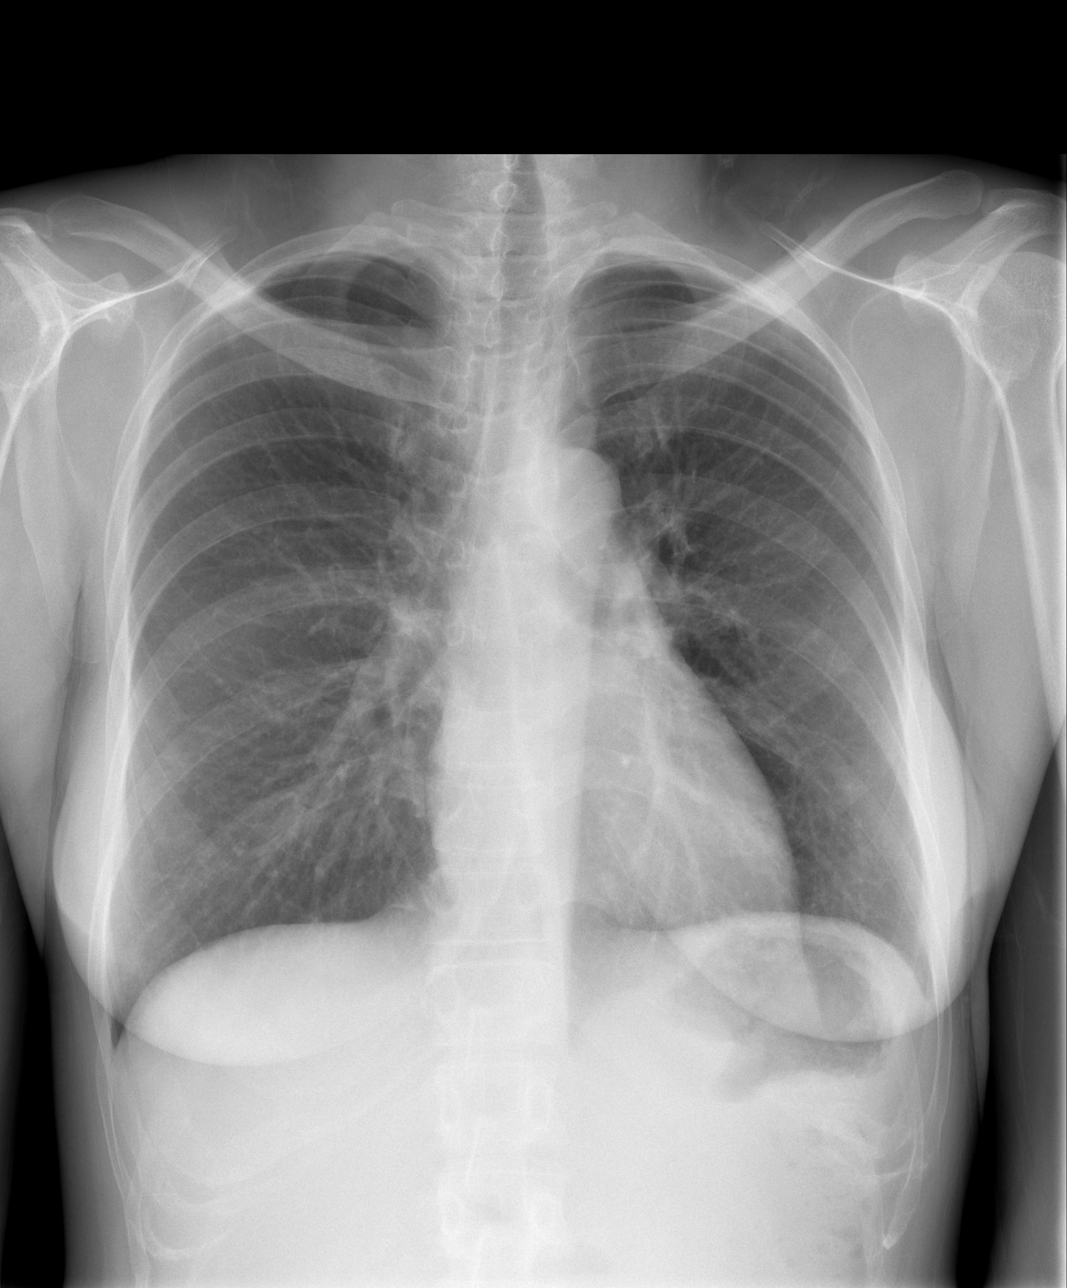

[w abdomen upright]
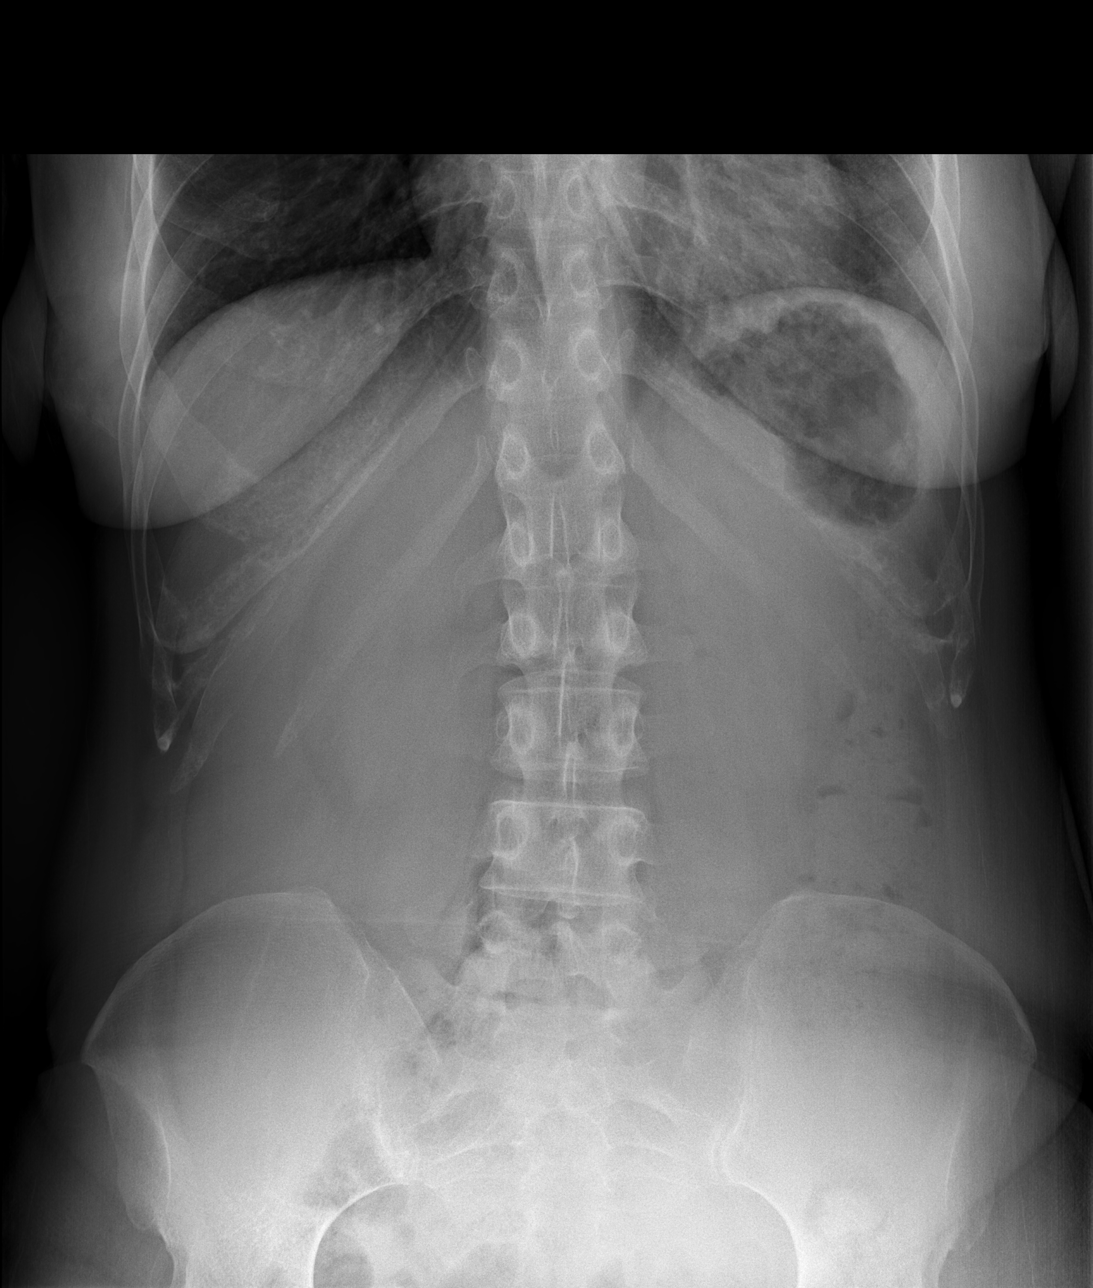

[t abdomen supine]
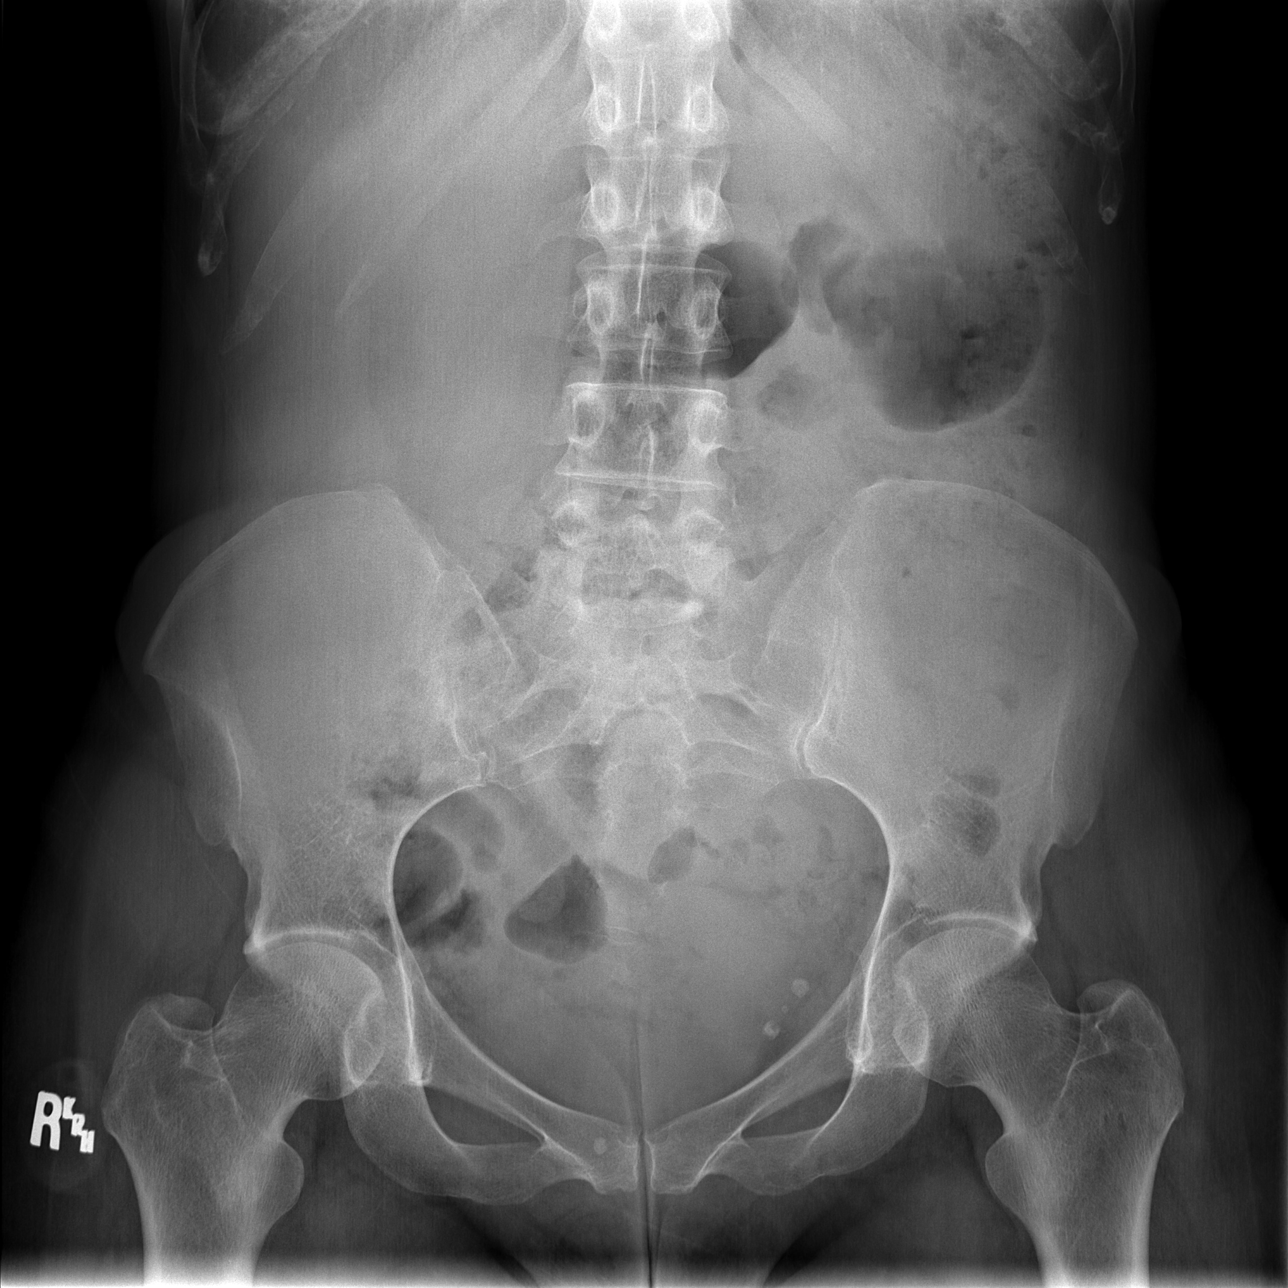

[3 of 3 positions shown; findings below may reference images not displayed]

FINDINGS: Cardiomediastinal silhouette is stable. No acute infiltrate or
pleural effusion. No pulmonary edema. There is normal small bowel
gas pattern. No free abdominal air. Some colonic gas noted in distal
right colon. Hepatomegaly is noted.
IMPRESSION: Normal small bowel gas pattern. Hepatomegaly is noted. Some colonic
gas noted in right colon. No acute cardiopulmonary disease.

## 2015-05-01 MED ORDER — METHOCARBAMOL 500 MG PO TABS: 1000.0000 mg | ORAL_TABLET | Freq: Three times a day (TID) | ORAL | Status: DC | PRN

## 2015-05-01 NOTE — Discharge Instructions (Signed)

## 2015-05-01 NOTE — ED Provider Notes (Signed)
CSN: [id number]     Arrival date & time 05/01/15  1041 History   First MD Initiated Contact with Patient 05/01/15 1115     No chief complaint on file.    (Consider location/radiation/quality/duration/timing/severity/associated sxs/prior Treatment) HPI Patient presents with several weeks of abdominal distention. She also complains of ongoing low back pain worse on the right. She is unable to give a timeline for this low back pain. She denies any vomiting, diarrhea or constipation. She states she does have episodic nausea. No fever or chills. Ongoing lower extremity swelling which is unchanged. Past Medical History  Diagnosis Date  . Diabetes mellitus   . Thyroid disease   . Hypertension   . High cholesterol   . Bipolar 1 disorder ([place])   . Seizures ([place])     last seizure May 2016  . Peripheral neuropathy [facility])    Past Surgical History  Procedure Laterality Date  . Tubal ligation    . Ankle surgery     Family History  Problem Relation Age of Onset  . Diabetes Father   . Cancer Maternal Grandfather    Social History  Substance Use Topics  . Smoking status: Current Some Day Smoker -- 0.50 packs/day    Types: Cigarettes  . Smokeless tobacco: Never Used  . Alcohol Use: No   OB History    No data available     Review of Systems  Constitutional: Negative for fever and chills.  Respiratory: Negative for cough and shortness of breath.   Cardiovascular: Positive for leg swelling. Negative for chest pain and palpitations.  Gastrointestinal: Positive for nausea and abdominal distention. Negative for vomiting, abdominal pain and diarrhea.  Genitourinary: Negative for dysuria, hematuria, flank pain and difficulty urinating.  Musculoskeletal: Positive for myalgias and back pain. Negative for neck pain.  Skin: Negative for rash and wound.  Neurological: Negative for dizziness, weakness, light-headedness, numbness and headaches.  All other systems reviewed and are  negative.     Allergies  Iohexol  Home Medications   Prior to Admission medications   Medication Sig Start Date End Date Taking? Authorizing Provider  ARIPiprazole (ABILIFY) 10 MG tablet Take 10 mg by mouth daily.     Yes Historical Provider, MD  DULoxetine (CYMBALTA) 60 MG capsule Take 60 mg by mouth daily.   Yes Historical Provider, MD  gabapentin (NEURONTIN) 600 MG tablet Take 1 tablet (600 mg total) by mouth 2 (two) times daily. 01/10/15  Yes Katheren Shams, DO  ibuprofen (ADVIL,MOTRIN) 800 MG tablet Take 1 tablet (800 mg total) by mouth 3 (three) times daily. 03/07/15  Yes Shawn C Joy, PA-C  insulin aspart (NOVOLOG) 100 UNIT/ML injection Inject into the skin 3 (three) times daily before meals.   Yes Historical Provider, MD  insulin glargine (LANTUS) 100 UNIT/ML injection Inject 15 Units into the skin daily. 06/22/12  Yes Belkys A Regalado, MD  levothyroxine (SYNTHROID, LEVOTHROID) 125 MCG tablet Take 125 mcg by mouth daily.     Yes Historical Provider, MD  metFORMIN (GLUCOPHAGE) 500 MG tablet Take by mouth 2 (two) times daily with a meal.   Yes Historical Provider, MD  nicotine (NICOTROL) 10 MG inhaler Inhale 2 puffs into the lungs daily as needed. To stop smoking   Yes Historical Provider, MD  oxyCODONE-acetaminophen (PERCOCET/ROXICET) 5-325 MG tablet Take 1-2 tablets by mouth every 4 (four) hours as needed for severe pain. 03/07/15  Yes Shawn C Joy, PA-C  phenytoin (DILANTIN) 100 MG ER capsule Take 200 mg by mouth  2 (two) times daily.    Yes Historical Provider, MD  risperiDONE (RISPERDAL) 0.5 MG tablet Take 0.5 mg by mouth at bedtime.   Yes Historical Provider, MD  simvastatin (ZOCOR) 20 MG tablet Take 20 mg by mouth every evening.   Yes Historical Provider, MD  traZODone (DESYREL) 100 MG tablet Take 100 mg by mouth at bedtime.   Yes Historical Provider, MD  methocarbamol (ROBAXIN) 500 MG tablet Take 2 tablets (1,000 mg total) by mouth every 8 (eight) hours as needed for muscle  spasms. 05/01/15   Julianne Rice, MD   BP 120/89 mmHg  Pulse 90  Temp(Src) 98.4 F (36.9 C) (Oral)  Resp 16  Ht 5\' 5"  (1.651 m)  Wt 150 lb (68.04 kg)  BMI 24.96 kg/m2  SpO2 100%  LMP 04/24/2015 Physical Exam  Constitutional: She is oriented to person, place, and time. She appears well-developed and well-nourished. No distress.  HENT:  Head: Normocephalic and atraumatic.  Mouth/Throat: Oropharynx is clear and moist.  Eyes: EOM are normal. Pupils are equal, round, and reactive to light.  Neck: Normal range of motion. Neck supple.  Cardiovascular: Normal rate and regular rhythm.   Pulmonary/Chest: Effort normal and breath sounds normal. No respiratory distress. She has no wheezes. She has no rales.  Abdominal: Soft. She exhibits distension (abdominal distention). She exhibits no mass. There is no tenderness. There is no rebound and no guarding.  Mildly decreased bowel sounds throughout.  Musculoskeletal: Normal range of motion. She exhibits no edema or tenderness.  No CVA tenderness bilaterally. Patient does have right-sided lumbar paraspinal tenderness to palpation. 1+ pitting edema bilateral lower extremities. No calf asymmetry. Distal pulses intact.  Neurological: She is alert and oriented to person, place, and time.  5/5 motor in all extremity. sensation is fully intact.  Skin: Skin is warm and dry. No rash noted. No erythema.  Psychiatric: She has a normal mood and affect. Her behavior is normal.  Nursing note and vitals reviewed.   ED Course  Procedures (including critical care time) Labs Review Labs Reviewed  URINALYSIS, ROUTINE W REFLEX MICROSCOPIC (NOT AT [facility]) - Abnormal; Notable for the following:    Glucose, UA 500 (*)    Ketones, ur 15 (*)    Protein, ur 30 (*)    All other components within normal limits  CBC WITH DIFFERENTIAL/PLATELET - Abnormal; Notable for the following:    HCT 35.6 (*)    All other components within normal limits  COMPREHENSIVE METABOLIC  PANEL - Abnormal; Notable for the following:    Alkaline Phosphatase 132 (*)    All other components within normal limits  PHENYTOIN LEVEL, TOTAL - Abnormal; Notable for the following:    Phenytoin Lvl <2.5 (*)    All other components within normal limits  URINE MICROSCOPIC-ADD ON - Abnormal; Notable for the following:    Squamous Epithelial / LPF 0-5 (*)    Bacteria, UA RARE (*)    All other components within normal limits  PREGNANCY, URINE  LIPASE, BLOOD    Imaging Review Dg Abd Acute W/chest  05/01/2015  CLINICAL DATA:  Abdominal bloating, nausea, lower back pain for 2 months EXAM: DG ABDOMEN ACUTE W/ 1V CHEST COMPARISON:  02/01/2015 FINDINGS: Cardiomediastinal silhouette is stable. No acute infiltrate or pleural effusion. No pulmonary edema. There is normal small bowel gas pattern. No free abdominal air. Some colonic gas noted in distal right colon. Hepatomegaly is noted. IMPRESSION: Normal small bowel gas pattern. Hepatomegaly is noted. Some colonic gas noted  in right colon. No acute cardiopulmonary disease. Electronically Signed   By: Lahoma Crocker M.D.   On: 05/01/2015 12:15   I have personally reviewed and evaluated these images and lab results as part of my medical decision-making.   EKG Interpretation None      MDM   Final diagnoses:  Abdominal distention  Lumbar strain, initial encounter    Abdominal x-rays without evidence of obstruction. Decreased bowel gas on the right side of the abdomen. Abdominal exam is benign but given these findings had ordered CT of the abdomen. Patient states she has not complete for the CT scan. States she will follow-up with her primary provider. Laboratory exam is within normal limits. She's been given return precautions and is voiced understanding.    Julianne Rice, MD 05/01/15 1336

## 2015-05-01 NOTE — ED Notes (Signed)
C/o mid abd pain and right back pain. No injury. C/o nausea. No constipation, has bm every day. Feels like abd is bloated. Was seen at [facility] for same and states she was put on birth control but did not take it.

## 2015-07-15 ENCOUNTER — Encounter (HOSPITAL_BASED_OUTPATIENT_CLINIC_OR_DEPARTMENT_OTHER): Payer: Self-pay

## 2015-07-15 ENCOUNTER — Emergency Department (HOSPITAL_BASED_OUTPATIENT_CLINIC_OR_DEPARTMENT_OTHER)
Admission: EM | Admit: 2015-07-15 | Discharge: 2015-07-15 | Disposition: A | Discharge status: Home / Self Care | Payer: Self-pay | Attending: Emergency Medicine | Admitting: Emergency Medicine

## 2015-07-15 DIAGNOSIS — Z794 Long term (current) use of insulin: Secondary | ICD-10-CM | POA: Insufficient documentation

## 2015-07-15 DIAGNOSIS — E7800 Pure hypercholesterolemia, unspecified: Secondary | ICD-10-CM | POA: Insufficient documentation

## 2015-07-15 DIAGNOSIS — E119 Type 2 diabetes mellitus without complications: Secondary | ICD-10-CM | POA: Insufficient documentation

## 2015-07-15 DIAGNOSIS — F17210 Nicotine dependence, cigarettes, uncomplicated: Secondary | ICD-10-CM | POA: Insufficient documentation

## 2015-07-15 DIAGNOSIS — E039 Hypothyroidism, unspecified: Secondary | ICD-10-CM | POA: Insufficient documentation

## 2015-07-15 DIAGNOSIS — Z7984 Long term (current) use of oral hypoglycemic drugs: Secondary | ICD-10-CM | POA: Insufficient documentation

## 2015-07-15 DIAGNOSIS — G629 Polyneuropathy, unspecified: Secondary | ICD-10-CM | POA: Insufficient documentation

## 2015-07-15 DIAGNOSIS — Z79899 Other long term (current) drug therapy: Secondary | ICD-10-CM | POA: Insufficient documentation

## 2015-07-15 DIAGNOSIS — I10 Essential (primary) hypertension: Secondary | ICD-10-CM | POA: Insufficient documentation

## 2015-07-15 DIAGNOSIS — F319 Bipolar disorder, unspecified: Secondary | ICD-10-CM | POA: Insufficient documentation

## 2015-07-15 DIAGNOSIS — K0889 Other specified disorders of teeth and supporting structures: Secondary | ICD-10-CM | POA: Insufficient documentation

## 2015-07-15 DIAGNOSIS — K029 Dental caries, unspecified: Secondary | ICD-10-CM | POA: Insufficient documentation

## 2015-07-15 MED ORDER — PENICILLIN V POTASSIUM 250 MG PO TABS
500.0000 mg | ORAL_TABLET | Freq: Once | ORAL | Status: AC
  Administered 2015-07-15: 500 mg via ORAL
  Filled 2015-07-15: qty 2

## 2015-07-15 MED ORDER — IBUPROFEN 800 MG PO TABS: 800.0000 mg | ORAL_TABLET | Freq: Three times a day (TID) | ORAL | Status: DC

## 2015-07-15 MED ORDER — PENICILLIN V POTASSIUM 500 MG PO TABS: 500.0000 mg | ORAL_TABLET | Freq: Four times a day (QID) | ORAL | Status: AC

## 2015-07-15 NOTE — ED Provider Notes (Signed)
CSN: [id number]     Arrival date & time 07/15/15  1142 History   First MD Initiated Contact with Patient 07/15/15 1146     Chief Complaint  Patient presents with  . Dental Pain     (Consider location/radiation/quality/duration/timing/severity/associated sxs/prior Treatment) Patient is a 40 y.o. female presenting with tooth pain. The history is provided by the patient and medical records. No language interpreter was used.  Dental Pain Associated symptoms: no fever, no headaches and no neck pain    Zoe Clay is a 40 y.o. female who presents to the Emergency Department complaining of worsening, throbbing right lower dental pain x 3 days. Patient states she was eating when her tooth cracked 3 days ago. She states half of her tooth came out at that time. Denies difficulty swallowing, difficulty breathing, and neck or oral swelling. Ibuprofen and Tylenol taken with minimal relief. Patient states she called her dentist and has an appointment scheduled for Friday, however they could not see her any sooner.  Past Medical History  Diagnosis Date  . Diabetes mellitus   . Thyroid disease   . Hypertension   . High cholesterol   . Bipolar 1 disorder ([place])   . Seizures ([place])     last seizure May 2016  . Peripheral neuropathy [facility])    Past Surgical History  Procedure Laterality Date  . Tubal ligation    . Ankle surgery     Family History  Problem Relation Age of Onset  . Diabetes Father   . Cancer Maternal Grandfather    Social History  Substance Use Topics  . Smoking status: Current Some Day Smoker -- 0.50 packs/day    Types: Cigarettes  . Smokeless tobacco: Never Used  . Alcohol Use: No   OB History    No data available     Review of Systems  Constitutional: Negative for fever.  HENT: Positive for dental problem.   Eyes: Negative for visual disturbance.  Respiratory: Negative for cough and shortness of breath.   Cardiovascular: Negative.   Gastrointestinal: Negative for  abdominal pain.  Genitourinary: Negative for dysuria.  Musculoskeletal: Negative for neck pain.  Skin: Negative for rash.  Neurological: Negative for dizziness and headaches.      Allergies  Iohexol  Home Medications   Prior to Admission medications   Medication Sig Start Date End Date Taking? Authorizing Provider  busPIRone (BUSPAR) 5 MG tablet Take 5 mg by mouth 2 (two) times daily.   Yes Historical Provider, MD  ARIPiprazole (ABILIFY) 10 MG tablet Take 10 mg by mouth daily.      Historical Provider, MD  DULoxetine (CYMBALTA) 60 MG capsule Take 60 mg by mouth daily.    Historical Provider, MD  gabapentin (NEURONTIN) 600 MG tablet Take 1 tablet (600 mg total) by mouth 2 (two) times daily. 01/10/15   Katheren Shams, DO  ibuprofen (ADVIL,MOTRIN) 800 MG tablet Take 1 tablet (800 mg total) by mouth 3 (three) times daily. 07/15/15   Ozella Almond Ward, PA-C  insulin aspart (NOVOLOG) 100 UNIT/ML injection Inject into the skin 3 (three) times daily before meals.    Historical Provider, MD  insulin glargine (LANTUS) 100 UNIT/ML injection Inject 15 Units into the skin daily. 06/22/12   Belkys A Regalado, MD  levothyroxine (SYNTHROID, LEVOTHROID) 125 MCG tablet Take 125 mcg by mouth daily.      Historical Provider, MD  metFORMIN (GLUCOPHAGE) 500 MG tablet Take by mouth 2 (two) times daily with a meal.  Historical Provider, MD  methocarbamol (ROBAXIN) 500 MG tablet Take 2 tablets (1,000 mg total) by mouth every 8 (eight) hours as needed for muscle spasms. 05/01/15   Julianne Rice, MD  nicotine (NICOTROL) 10 MG inhaler Inhale 2 puffs into the lungs daily as needed. To stop smoking    Historical Provider, MD  oxyCODONE-acetaminophen (PERCOCET/ROXICET) 5-325 MG tablet Take 1-2 tablets by mouth every 4 (four) hours as needed for severe pain. 03/07/15   Shawn C Joy, PA-C  penicillin v potassium (VEETID) 500 MG tablet Take 1 tablet (500 mg total) by mouth 4 (four) times daily. 07/15/15 07/22/15  Ozella Almond  Ward, PA-C  phenytoin (DILANTIN) 100 MG ER capsule Take 200 mg by mouth 2 (two) times daily.     Historical Provider, MD  risperiDONE (RISPERDAL) 0.5 MG tablet Take 0.5 mg by mouth at bedtime.    Historical Provider, MD  simvastatin (ZOCOR) 20 MG tablet Take 20 mg by mouth every evening.    Historical Provider, MD  traZODone (DESYREL) 100 MG tablet Take 100 mg by mouth at bedtime.    Historical Provider, MD   BP 144/94 mmHg  Pulse 99  Temp(Src) 98.1 F (36.7 C) (Oral)  Resp 18  Ht 5\' 5"  (1.651 m)  Wt 68.04 kg  BMI 24.96 kg/m2  SpO2 100% Physical Exam  Constitutional: She is oriented to person, place, and time. She appears well-developed and well-nourished.  Alert and in no acute distress  HENT:  Head: Normocephalic and atraumatic.  Mouth/Throat:    Dental cavities and poor oral dentition noted. Midline uvula, no trismus, oropharynx moist and clear, no abscess noted, no oropharyngeal erythema or edema, neck supple and no tenderness. No facial edema.  Cardiovascular: Normal rate, regular rhythm, normal heart sounds and intact distal pulses.  Exam reveals no gallop and no friction rub.   No murmur heard. Pulmonary/Chest: Effort normal and breath sounds normal. No respiratory distress. She has no wheezes. She has no rales. She exhibits no tenderness.  Abdominal: Soft. Bowel sounds are normal. She exhibits no distension and no mass. There is no tenderness. There is no rebound and no guarding.  Musculoskeletal: She exhibits no edema.  Neurological: She is alert and oriented to person, place, and time.  Skin: Skin is warm and dry.  Nursing note and vitals reviewed.   ED Course  Procedures (including critical care time) Labs Review Labs Reviewed - No data to display  Imaging Review No results found. I have personally reviewed and evaluated these images and lab results as part of my medical decision-making.   EKG Interpretation None      MDM   Final diagnoses:  Dentalgia    Patient with dentalgia. No abscess requiring immediate incision and drainage. Patient is afebrile, non toxic appearing, and swallowing secretions well. Exam not concerning for Ludwig's angina or pharyngeal abscess. Dental block offered for pain relief, however patient declined. Will treat with PenVK. Patient has an appointment scheduled with her dentist on Friday. Stressed the importance of keeping this scheduled appointment.  Patient voices understanding and is agreeable to plan.  [facility] Ward, PA-C 07/15/15 1311  Dorie Rank, MD 07/16/15 [phone]

## 2015-07-15 NOTE — ED Notes (Signed)
Patient here with right lower dental pain, tooth broken and swelling noted to jaw

## 2015-07-15 NOTE — Discharge Instructions (Signed)
You have a dental injury. It is very important that you get evaluated by a dentist as soon as possible. Keep your scheduled appointment with the dentist on Friday. Take your full course of antibiotics. Read the instructions below.  Eat a soft or liquid diet and rinse your mouth out after meals with warm water. You should see a dentist or return here at once if you have increased swelling, increased pain or uncontrolled bleeding from the site of your injury.  SEEK MEDICAL CARE IF:   You have increased pain not controlled with medicines.   You have swelling around your tooth, in your face or neck.   You have bleeding which starts, continues, or gets worse.   You have a fever >101  If you are unable to open your mouth

## 2015-07-20 ENCOUNTER — Emergency Department (HOSPITAL_BASED_OUTPATIENT_CLINIC_OR_DEPARTMENT_OTHER)
Admission: EM | Admit: 2015-07-20 | Discharge: 2015-07-20 | Disposition: A | Discharge status: Home / Self Care | Payer: Self-pay | Attending: Emergency Medicine | Admitting: Emergency Medicine

## 2015-07-20 ENCOUNTER — Encounter (HOSPITAL_BASED_OUTPATIENT_CLINIC_OR_DEPARTMENT_OTHER): Payer: Self-pay | Admitting: *Deleted

## 2015-07-20 DIAGNOSIS — K0889 Other specified disorders of teeth and supporting structures: Secondary | ICD-10-CM

## 2015-07-20 DIAGNOSIS — F17210 Nicotine dependence, cigarettes, uncomplicated: Secondary | ICD-10-CM | POA: Insufficient documentation

## 2015-07-20 DIAGNOSIS — F319 Bipolar disorder, unspecified: Secondary | ICD-10-CM | POA: Insufficient documentation

## 2015-07-20 DIAGNOSIS — E1142 Type 2 diabetes mellitus with diabetic polyneuropathy: Secondary | ICD-10-CM | POA: Insufficient documentation

## 2015-07-20 DIAGNOSIS — K029 Dental caries, unspecified: Secondary | ICD-10-CM | POA: Insufficient documentation

## 2015-07-20 DIAGNOSIS — I10 Essential (primary) hypertension: Secondary | ICD-10-CM | POA: Insufficient documentation

## 2015-07-20 MED ORDER — HYDROCODONE-ACETAMINOPHEN 5-325 MG PO TABS: 1.0000 | ORAL_TABLET | Freq: Four times a day (QID) | ORAL | Status: DC | PRN

## 2015-07-20 MED ORDER — OXYCODONE-ACETAMINOPHEN 5-325 MG PO TABS
1.0000 | ORAL_TABLET | Freq: Once | ORAL | Status: AC
  Administered 2015-07-20: 1 via ORAL
  Filled 2015-07-20: qty 1

## 2015-07-20 NOTE — ED Notes (Signed)
Dental pain x 2 days. States she was seen and given medication with no relief.

## 2015-07-20 NOTE — ED Provider Notes (Signed)
CSN: [id number]     Arrival date & time 07/20/15  2150 History   First MD Initiated Contact with Patient 07/20/15 2256     Chief Complaint  Patient presents with  . Dental Pain     (Consider location/radiation/quality/duration/timing/severity/associated sxs/prior Treatment) HPI Comments: Patient has been taking her antibiotic and anti-inflammatory with no significant improvement in pain. Patient was initially supposed to see her dentist today, but they called and rescheduled for Monday.  Patient is a 40 y.o. female presenting with tooth pain. The history is provided by the patient and medical records. No language interpreter was used.  Dental Pain Location:  Lower Lower teeth location:  18/LL 2nd molar and 19/LL 1st molar Quality:  Pulsating and pressure-like Severity:  Severe Duration:  5 days Timing:  Constant Progression:  Worsening Chronicity:  New Context: dental fracture   Ineffective treatments:  NSAIDs Associated symptoms: no fever     Past Medical History  Diagnosis Date  . Diabetes mellitus   . Thyroid disease   . Hypertension   . High cholesterol   . Bipolar 1 disorder ([place])   . Seizures ([place])     last seizure May 2016  . Peripheral neuropathy [facility])    Past Surgical History  Procedure Laterality Date  . Tubal ligation    . Ankle surgery     Family History  Problem Relation Age of Onset  . Diabetes Father   . Cancer Maternal Grandfather    Social History  Substance Use Topics  . Smoking status: Current Some Day Smoker -- 0.50 packs/day    Types: Cigarettes  . Smokeless tobacco: Never Used  . Alcohol Use: No   OB History    No data available     Review of Systems  Constitutional: Negative for fever.  HENT: Positive for dental problem.   All other systems reviewed and are negative.     Allergies  Iohexol  Home Medications   Prior to Admission medications   Medication Sig Start Date End Date Taking? Authorizing Provider  ARIPiprazole  (ABILIFY) 10 MG tablet Take 10 mg by mouth daily.      Historical Provider, MD  busPIRone (BUSPAR) 5 MG tablet Take 5 mg by mouth 2 (two) times daily.    Historical Provider, MD  DULoxetine (CYMBALTA) 60 MG capsule Take 60 mg by mouth daily.    Historical Provider, MD  gabapentin (NEURONTIN) 600 MG tablet Take 1 tablet (600 mg total) by mouth 2 (two) times daily. 01/10/15   Katheren Shams, DO  ibuprofen (ADVIL,MOTRIN) 800 MG tablet Take 1 tablet (800 mg total) by mouth 3 (three) times daily. 07/15/15   Ozella Almond Ward, PA-C  insulin aspart (NOVOLOG) 100 UNIT/ML injection Inject into the skin 3 (three) times daily before meals.    Historical Provider, MD  insulin glargine (LANTUS) 100 UNIT/ML injection Inject 15 Units into the skin daily. 06/22/12   Belkys A Regalado, MD  levothyroxine (SYNTHROID, LEVOTHROID) 125 MCG tablet Take 125 mcg by mouth daily.      Historical Provider, MD  metFORMIN (GLUCOPHAGE) 500 MG tablet Take by mouth 2 (two) times daily with a meal.    Historical Provider, MD  methocarbamol (ROBAXIN) 500 MG tablet Take 2 tablets (1,000 mg total) by mouth every 8 (eight) hours as needed for muscle spasms. 05/01/15   Julianne Rice, MD  nicotine (NICOTROL) 10 MG inhaler Inhale 2 puffs into the lungs daily as needed. To stop smoking    Historical Provider, MD  oxyCODONE-acetaminophen (PERCOCET/ROXICET) 5-325 MG tablet Take 1-2 tablets by mouth every 4 (four) hours as needed for severe pain. 03/07/15   Shawn C Joy, PA-C  penicillin v potassium (VEETID) 500 MG tablet Take 1 tablet (500 mg total) by mouth 4 (four) times daily. 07/15/15 07/22/15  Ozella Almond Ward, PA-C  phenytoin (DILANTIN) 100 MG ER capsule Take 200 mg by mouth 2 (two) times daily.     Historical Provider, MD  risperiDONE (RISPERDAL) 0.5 MG tablet Take 0.5 mg by mouth at bedtime.    Historical Provider, MD  simvastatin (ZOCOR) 20 MG tablet Take 20 mg by mouth every evening.    Historical Provider, MD  traZODone (DESYREL) 100 MG  tablet Take 100 mg by mouth at bedtime.    Historical Provider, MD   BP 135/83 mmHg  Pulse 95  Temp(Src) 98.2 F (36.8 C) (Oral)  Resp 18  Ht 5\' 5"  (1.651 m)  Wt 68.04 kg  BMI 24.96 kg/m2  SpO2 100%  LMP 07/10/2015 Physical Exam  Constitutional: She is oriented to person, place, and time. She appears well-developed and well-nourished.  HENT:  Head: Normocephalic.  Mouth/Throat: Dental caries present.    Eyes: Pupils are equal, round, and reactive to light.  Neck: Neck supple.  Cardiovascular: Normal rate and regular rhythm.   Pulmonary/Chest: Effort normal and breath sounds normal.  Abdominal: Soft. Bowel sounds are normal.  Musculoskeletal: She exhibits no edema or tenderness.  Lymphadenopathy:    She has no cervical adenopathy.  Neurological: She is alert and oriented to person, place, and time.  Skin: Skin is warm and dry.  Psychiatric: She has a normal mood and affect.  Nursing note and vitals reviewed.   ED Course  Procedures (including critical care time) Labs Review Labs Reviewed - No data to display  Imaging Review No results found. I have personally reviewed and evaluated these images and lab results as part of my medical decision-making.   EKG Interpretation None      MDM   Final diagnoses:  None  Patient with dentalgia.  No abscess requiring immediate incision and drainage.  Exam not concerning for Ludwig's angina or pharyngeal abscess.  Patient to continue with antibiotic and anti-inflammatory. Will add limited amount of norco for continuing pain. Pt states she has a rescheduled dental appointment on Monday.  Discussed return precautions. Pt safe for discharge.      Etta Quill, NP 07/21/15 [id number]  Merrily Pew, MD 07/21/15 [phone]

## 2015-07-20 NOTE — Discharge Instructions (Signed)
Dental Pain Dental pain may be caused by many things, including:  Tooth decay (cavities or caries). Cavities expose the nerve of your tooth to air and hot or cold temperatures. This can cause pain or discomfort.  Abscess or infection. A dental abscess is a collection of infected pus from a bacterial infection in the inner part of the tooth (pulp). It usually occurs at the end of the tooth's root.  Injury.  An unknown reason (idiopathic). Your pain may be mild or severe. It may only occur when:  You are chewing.  You are exposed to hot or cold temperature.  You are eating or drinking sugary foods or beverages, such as soda or candy. Your pain may also be constant. HOME CARE INSTRUCTIONS Watch your dental pain for any changes. The following actions may help to lessen any discomfort that you are feeling:  Take medicines only as directed by your dentist.  If you were prescribed an antibiotic medicine, finish all of it even if you start to feel better.  Keep all follow-up visits as directed by your dentist. This is important.  Do not apply heat to the outside of your face.  Rinse your mouth or gargle with salt water if directed by your dentist. This helps with pain and swelling.  You can make salt water by adding  tsp of salt to 1 cup of warm water.  Apply ice to the painful area of your face:  Put ice in a plastic bag.  Place a towel between your skin and the bag.  Leave the ice on for 20 minutes, 2-3 times per day.  Avoid foods or drinks that cause you pain, such as:  Very hot or very cold foods or drinks.  Sweet or sugary foods or drinks. SEEK MEDICAL CARE IF:  Your pain is not controlled with medicines.  Your symptoms are worse.  You have new symptoms. SEEK IMMEDIATE MEDICAL CARE IF:  You are unable to open your mouth.  You are having trouble breathing or swallowing.  You have a fever.  Your face, neck, or jaw is swollen.   This information is not  intended to replace advice given to you by your health care provider. Make sure you discuss any questions you have with your health care provider.   Document Released: 03/31/2005 Document Revised: 08/15/2014 Document Reviewed: 03/27/2014 Elsevier Interactive Patient Education 2016 [place] ANTIBIOTIC AND ANTI-INFLAMMATORY. YOU MAY USE THE [place] AT BEDTIME FOR SEVERE PAIN. FOLLOW UP WITH THE DENTIST AS SCHEDULED ON Monday.

## 2015-12-13 ENCOUNTER — Encounter (HOSPITAL_BASED_OUTPATIENT_CLINIC_OR_DEPARTMENT_OTHER): Payer: Self-pay | Admitting: *Deleted

## 2015-12-13 ENCOUNTER — Emergency Department (HOSPITAL_BASED_OUTPATIENT_CLINIC_OR_DEPARTMENT_OTHER)
Admission: EM | Admit: 2015-12-13 | Discharge: 2015-12-13 | Disposition: A | Discharge status: Home / Self Care | Payer: Self-pay | Attending: Emergency Medicine | Admitting: Emergency Medicine

## 2015-12-13 DIAGNOSIS — I10 Essential (primary) hypertension: Secondary | ICD-10-CM | POA: Insufficient documentation

## 2015-12-13 DIAGNOSIS — F419 Anxiety disorder, unspecified: Secondary | ICD-10-CM | POA: Insufficient documentation

## 2015-12-13 DIAGNOSIS — E119 Type 2 diabetes mellitus without complications: Secondary | ICD-10-CM | POA: Insufficient documentation

## 2015-12-13 DIAGNOSIS — Z7984 Long term (current) use of oral hypoglycemic drugs: Secondary | ICD-10-CM | POA: Insufficient documentation

## 2015-12-13 DIAGNOSIS — Z794 Long term (current) use of insulin: Secondary | ICD-10-CM | POA: Insufficient documentation

## 2015-12-13 DIAGNOSIS — Z79899 Other long term (current) drug therapy: Secondary | ICD-10-CM | POA: Insufficient documentation

## 2015-12-13 DIAGNOSIS — F17210 Nicotine dependence, cigarettes, uncomplicated: Secondary | ICD-10-CM | POA: Insufficient documentation

## 2015-12-13 MED ORDER — HYDROXYZINE HCL 25 MG PO TABS: 25.0000 mg | ORAL_TABLET | Freq: Four times a day (QID) | ORAL | 0 refills | Status: DC

## 2015-12-13 MED ORDER — ACETAMINOPHEN 325 MG PO TABS
650.0000 mg | ORAL_TABLET | Freq: Once | ORAL | Status: AC
  Administered 2015-12-13: 650 mg via ORAL
  Filled 2015-12-13: qty 2

## 2015-12-13 NOTE — ED Notes (Signed)
Pa  at bedside. 

## 2015-12-13 NOTE — Discharge Instructions (Signed)
You have been seen today for anxiety. Follow up with PCP as as soon as possible for chronic management of this issue. Return to ED as needed.
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
## 2015-12-13 NOTE — ED Notes (Signed)
Patient reports that she is very upset with the fact that we seem to not care about her situation. The patient is made aware that MY priority is her and her safety. We will do everything that we can. The patient is upset and slightly agitated while discussing this with this rn. The patient calmer after talking with this nurse. Reports no pain, and is now calm and cooperative.

## 2015-12-13 NOTE — ED Notes (Addendum)
Pt ok taking some water for fluids.

## 2015-12-13 NOTE — ED Notes (Signed)
Offered pt fluids with choices. Pt states she does not want anything.
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
## 2016-01-12 ENCOUNTER — Emergency Department (HOSPITAL_BASED_OUTPATIENT_CLINIC_OR_DEPARTMENT_OTHER)
Admission: EM | Admit: 2016-01-12 | Discharge: 2016-01-12 | Disposition: A | Discharge status: Home / Self Care | Payer: BLUE CROSS/BLUE SHIELD | Attending: Emergency Medicine | Admitting: Emergency Medicine

## 2016-01-12 ENCOUNTER — Encounter (HOSPITAL_BASED_OUTPATIENT_CLINIC_OR_DEPARTMENT_OTHER): Payer: Self-pay | Admitting: *Deleted

## 2016-01-12 DIAGNOSIS — E119 Type 2 diabetes mellitus without complications: Secondary | ICD-10-CM | POA: Insufficient documentation

## 2016-01-12 DIAGNOSIS — K0889 Other specified disorders of teeth and supporting structures: Secondary | ICD-10-CM

## 2016-01-12 DIAGNOSIS — I10 Essential (primary) hypertension: Secondary | ICD-10-CM | POA: Diagnosis not present

## 2016-01-12 DIAGNOSIS — F17210 Nicotine dependence, cigarettes, uncomplicated: Secondary | ICD-10-CM | POA: Diagnosis not present

## 2016-01-12 DIAGNOSIS — Z79899 Other long term (current) drug therapy: Secondary | ICD-10-CM | POA: Diagnosis not present

## 2016-01-12 DIAGNOSIS — K029 Dental caries, unspecified: Secondary | ICD-10-CM | POA: Insufficient documentation

## 2016-01-12 DIAGNOSIS — Z794 Long term (current) use of insulin: Secondary | ICD-10-CM | POA: Diagnosis not present

## 2016-01-12 DIAGNOSIS — E039 Hypothyroidism, unspecified: Secondary | ICD-10-CM | POA: Insufficient documentation

## 2016-01-12 MED ORDER — PENICILLIN V POTASSIUM 500 MG PO TABS: 500.0000 mg | ORAL_TABLET | Freq: Three times a day (TID) | ORAL | 0 refills | Status: DC

## 2016-01-12 MED ORDER — HYDROCODONE-ACETAMINOPHEN 5-325 MG PO TABS: 1.0000 | ORAL_TABLET | Freq: Four times a day (QID) | ORAL | 0 refills | Status: DC | PRN

## 2016-01-12 NOTE — ED Provider Notes (Signed)
[facility] DEPT [facility] Provider Note   CSN: [id number] Arrival date & time: 01/12/16  0751     History   Chief Complaint Chief Complaint  Patient presents with  . Dental Pain    HPI Zoe Clay is a 40 y.o. female.  Patient is a 40 year old female with past medical history of diabetes, bipolar, hypertension, and poor dentition. She presents for evaluation of dental pain. She reports swelling and pain to her upper teeth. She denies any fevers or chills. She denies any difficulty breathing or swallowing. Her pain is worse with eating and drinking and Tylenol and ibuprofen have done little to reduce her pain.  She called her dentist and has an appointment set up for a week and a half away, however she is feeling worse.      Past Medical History:  Diagnosis Date  . Bipolar 1 disorder ([place])   . Diabetes mellitus   . High cholesterol   . Hypertension   . Peripheral neuropathy ([place])   . Seizures ([place])    last seizure May 2016  . Thyroid disease     Patient Active Problem List   Diagnosis Date Noted  . DKA (diabetic ketoacidoses) ([place]) 06/20/2012  . Nausea with vomiting 06/20/2012  . 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 Seizure disorder ([place]) 06/20/2012  . Unspecified hypothyroidism 06/20/2012  . Hyponatremia 06/20/2012    Past Surgical History:  Procedure Laterality Date  . ANKLE SURGERY    . TUBAL LIGATION      OB History    No data available       Home Medications    Prior to Admission medications   Medication Sig Start Date End Date Taking? Authorizing Provider  busPIRone (BUSPAR) 5 MG tablet Take 5 mg by mouth 2 (two) times daily.   Yes Historical Provider, MD  DULoxetine (CYMBALTA) 60 MG capsule Take 60 mg by mouth daily.   Yes Historical Provider, MD  gabapentin (NEURONTIN) 600 MG tablet Take 1 tablet (600 mg total) by mouth 2 (two) times daily. 01/10/15  Yes Katheren Shams, DO  hydrOXYzine  (ATARAX/VISTARIL) 25 MG tablet Take 1 tablet (25 mg total) by mouth every 6 (six) hours. 12/13/15  Yes Shawn C Joy, PA-C  insulin aspart (NOVOLOG) 100 UNIT/ML injection Inject into the skin 3 (three) times daily before meals.   Yes Historical Provider, MD  insulin glargine (LANTUS) 100 UNIT/ML injection Inject 15 Units into the skin daily. 06/22/12  Yes Belkys A Regalado, MD  levothyroxine (SYNTHROID, LEVOTHROID) 125 MCG tablet Take 125 mcg by mouth daily.     Yes Historical Provider, MD  metFORMIN (GLUCOPHAGE) 500 MG tablet Take by mouth 2 (two) times daily with a meal.   Yes Historical Provider, MD  phenytoin (DILANTIN) 100 MG ER capsule Take 200 mg by mouth 2 (two) times daily.    Yes Historical Provider, MD  risperiDONE (RISPERDAL) 2 MG tablet Take 2 mg by mouth 2 (two) times daily.   Yes Historical Provider, MD  simvastatin (ZOCOR) 20 MG tablet Take 20 mg by mouth every evening.   Yes Historical Provider, MD  traZODone (DESYREL) 100 MG tablet Take 100 mg by mouth at bedtime.   Yes Historical Provider, MD  ARIPiprazole (ABILIFY) 10 MG tablet Take 10 mg by mouth daily.      Historical Provider, MD  HYDROcodone-acetaminophen (NORCO) 5-325 MG tablet Take 1 tablet by mouth every 6 (six) hours as needed for severe pain. 07/20/15  Etta Quill, NP  ibuprofen (ADVIL,MOTRIN) 800 MG tablet Take 1 tablet (800 mg total) by mouth 3 (three) times daily. 07/15/15   Ozella Almond Ward, PA-C  methocarbamol (ROBAXIN) 500 MG tablet Take 2 tablets (1,000 mg total) by mouth every 8 (eight) hours as needed for muscle spasms. 05/01/15   Julianne Rice, MD  nicotine (NICOTROL) 10 MG inhaler Inhale 2 puffs into the lungs daily as needed. To stop smoking    Historical Provider, MD  oxyCODONE-acetaminophen (PERCOCET/ROXICET) 5-325 MG tablet Take 1-2 tablets by mouth every 4 (four) hours as needed for severe pain. 03/07/15   Lorayne Bender, PA-C    Family History Family History  Problem Relation Age of Onset  . Diabetes Father    . Cancer Maternal Grandfather     Social History Social History  Substance Use Topics  . Smoking status: Current Some Day Smoker    Packs/day: 0.50    Types: Cigarettes  . Smokeless tobacco: Never Used  . Alcohol use No     Allergies   Iohexol   Review of Systems Review of Systems  All other systems reviewed and are negative.    Physical Exam Updated Vital Signs BP 95/64 (BP Location: Right Arm)   Pulse 110   Temp 98.8 F (37.1 C) (Oral)   Resp 16   Ht 5\' 5"  (1.651 m)   Wt 153 lb (69.4 kg)   LMP 01/07/2016 (Approximate)   SpO2 100%   BMI 25.46 kg/m   Physical Exam  Constitutional: She appears well-developed and well-nourished. No distress.  HENT:  Head: Normocephalic and atraumatic.  There is no obvious facial swelling. There is no trismus. There is no stridor. She has multiple, heavily decayed teeth to the upper jaw. There is surrounding gingival inflammation, however no significant swelling that would be suggestive of an abscess.  Neck: Normal range of motion. Neck supple.  Pulmonary/Chest: Effort normal.  Skin: Skin is warm and dry. She is not diaphoretic.  Nursing note and vitals reviewed.    ED Treatments / Results  Labs (all labs ordered are listed, but only abnormal results are displayed) Labs Reviewed - No data to display  EKG  EKG Interpretation None       Radiology No results found.  Procedures Procedures (including critical care time)  Medications Ordered in ED Medications - No data to display   Initial Impression / Assessment and Plan / ED Course  I have reviewed the triage vital signs and the nursing notes.  Pertinent labs & imaging results that were available during my care of the patient were reviewed by me and considered in my medical decision making (see chart for details).  Clinical Course    She will be treated with penicillin, pain medication, and follow-up with her dentist. She is to call Monday to arrange an  expedited appointment if she is not feeling better by this time.  Final Clinical Impressions(s) / ED Diagnoses   Final diagnoses:  None    New Prescriptions New Prescriptions   No medications on file     Veryl Speak, MD 01/12/16 [phone]

## 2016-01-12 NOTE — ED Triage Notes (Signed)
Pt reports L upper gum tooth pain x3days. Reports she has an appt with a dentist in approx 1.5wks. Denies fever. Reports Tylenol and Aleve ineffective.

## 2016-01-12 NOTE — Discharge Instructions (Signed)
Penicillin as prescribed.  Ibuprofen 600 mg every 6 hours as needed for pain.  Hydrocodone as prescribed as needed for pain not relieved with ibuprofen.  Call your dentist on Monday to arrange next dated appointment if your symptoms are not improving, or if they worsen.

## 2016-03-22 ENCOUNTER — Encounter (HOSPITAL_BASED_OUTPATIENT_CLINIC_OR_DEPARTMENT_OTHER): Payer: Self-pay | Admitting: Emergency Medicine

## 2016-03-22 ENCOUNTER — Emergency Department (HOSPITAL_BASED_OUTPATIENT_CLINIC_OR_DEPARTMENT_OTHER)
Admission: EM | Admit: 2016-03-22 | Discharge: 2016-03-22 | Disposition: A | Discharge status: Home / Self Care | Payer: BLUE CROSS/BLUE SHIELD | Attending: Emergency Medicine | Admitting: Emergency Medicine

## 2016-03-22 DIAGNOSIS — Z79899 Other long term (current) drug therapy: Secondary | ICD-10-CM | POA: Diagnosis not present

## 2016-03-22 DIAGNOSIS — I10 Essential (primary) hypertension: Secondary | ICD-10-CM | POA: Diagnosis not present

## 2016-03-22 DIAGNOSIS — K029 Dental caries, unspecified: Secondary | ICD-10-CM | POA: Diagnosis not present

## 2016-03-22 DIAGNOSIS — K0889 Other specified disorders of teeth and supporting structures: Secondary | ICD-10-CM

## 2016-03-22 DIAGNOSIS — F17210 Nicotine dependence, cigarettes, uncomplicated: Secondary | ICD-10-CM | POA: Insufficient documentation

## 2016-03-22 DIAGNOSIS — E109 Type 1 diabetes mellitus without complications: Secondary | ICD-10-CM | POA: Diagnosis not present

## 2016-03-22 DIAGNOSIS — Z791 Long term (current) use of non-steroidal anti-inflammatories (NSAID): Secondary | ICD-10-CM | POA: Insufficient documentation

## 2016-03-22 DIAGNOSIS — E039 Hypothyroidism, unspecified: Secondary | ICD-10-CM | POA: Insufficient documentation

## 2016-03-22 DIAGNOSIS — K047 Periapical abscess without sinus: Secondary | ICD-10-CM | POA: Diagnosis not present

## 2016-03-22 MED ORDER — KETOROLAC TROMETHAMINE 30 MG/ML IJ SOLN
60.0000 mg | Freq: Once | INTRAMUSCULAR | Status: AC
  Administered 2016-03-22: 30 mg via INTRAMUSCULAR
  Filled 2016-03-22: qty 2

## 2016-03-22 MED ORDER — KETOROLAC TROMETHAMINE 30 MG/ML IJ SOLN
INTRAMUSCULAR | Status: AC
Start: 2016-03-22 — End: 2016-03-22
  Administered 2016-03-22: 30 mg via INTRAMUSCULAR
  Filled 2016-03-22: qty 1

## 2016-03-22 MED ORDER — PENICILLIN V POTASSIUM 500 MG PO TABS: 1000.0000 mg | ORAL_TABLET | Freq: Two times a day (BID) | ORAL | 0 refills | Status: DC

## 2016-03-22 MED ORDER — NAPROXEN 500 MG PO TABS: 500.0000 mg | ORAL_TABLET | Freq: Two times a day (BID) | ORAL | 0 refills | Status: DC

## 2016-03-22 NOTE — ED Triage Notes (Signed)
L upper broken tooth with pain x 3 days.

## 2016-03-22 NOTE — ED Provider Notes (Signed)
[facility] DEPT [facility] Provider Note   CSN: [id number] Arrival date & time: 03/22/16  1519  By signing my name below, I, Macon Large, attest that this documentation has been prepared under the direction and in the presence of Sherwood Gambler, MD. Electronically Signed: Macon Large, ED Scribe. 03/22/16. 4:03 PM.  History   Chief Complaint Chief Complaint  Patient presents with  . Dental Pain   The history is provided by the patient. No language interpreter was used.    HPI Comments: Zoe Clay is a 40 y.o. female who presents to the Emergency Department complaining of moderate, constant, left upper broken tooth onset 5 days ago. Pt notes she broke her left upper tooth and had accompanied intermittent abscess. She notes they looked like "little white heads". Pt reports drainage of white pus from the affected area. She also reports associated intermittent upper left facial swelling and HA. Pt notes she has a dentist appointment on 12/28 to have her broken tooth removed. She states taking Tylenol, 500 mg, and Aleve with no relief. Pt notes her dental pain is affecting her blood sugar levels. She notes checking her blood sugar level this morning and reports it was 235. Pt denies fever. No additional complaints at this time.    Past Medical History:  Diagnosis Date  . Bipolar 1 disorder ([place])   . Diabetes mellitus   . High cholesterol   . Hypertension   . Peripheral neuropathy ([place])   . Seizures ([place])    last seizure May 2016  . Thyroid disease     Patient Active Problem List   Diagnosis Date Noted  . DKA (diabetic ketoacidoses) ([place]) 06/20/2012  . Nausea with vomiting 06/20/2012  . 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 Seizure disorder ([place]) 06/20/2012  . Unspecified hypothyroidism 06/20/2012  . Hyponatremia 06/20/2012    Past Surgical History:  Procedure Laterality Date  . ANKLE SURGERY    . TUBAL LIGATION       OB History    No data available       Home Medications    Prior to Admission medications   Medication Sig Start Date End Date Taking? Authorizing Provider  busPIRone (BUSPAR) 5 MG tablet Take 5 mg by mouth 2 (two) times daily.   Yes Historical Provider, MD  DULoxetine (CYMBALTA) 60 MG capsule Take 60 mg by mouth daily.   Yes Historical Provider, MD  gabapentin (NEURONTIN) 600 MG tablet Take 1 tablet (600 mg total) by mouth 2 (two) times daily. 01/10/15  Yes Katheren Shams, DO  hydrOXYzine (ATARAX/VISTARIL) 25 MG tablet Take 1 tablet (25 mg total) by mouth every 6 (six) hours. 12/13/15  Yes Shawn C Joy, PA-C  ibuprofen (ADVIL,MOTRIN) 800 MG tablet Take 1 tablet (800 mg total) by mouth 3 (three) times daily. 07/15/15  Yes Jaime Pilcher Ward, PA-C  insulin aspart (NOVOLOG) 100 UNIT/ML injection Inject into the skin 3 (three) times daily before meals.   Yes Historical Provider, MD  insulin glargine (LANTUS) 100 UNIT/ML injection Inject 15 Units into the skin daily. 06/22/12  Yes Belkys A Regalado, MD  levothyroxine (SYNTHROID, LEVOTHROID) 125 MCG tablet Take 125 mcg by mouth daily.     Yes Historical Provider, MD  metFORMIN (GLUCOPHAGE) 500 MG tablet Take by mouth 2 (two) times daily with a meal.   Yes Historical Provider, MD  phenytoin (DILANTIN) 100 MG ER capsule Take 200 mg by mouth 2 (two) times daily.    Yes  Historical Provider, MD  risperiDONE (RISPERDAL) 2 MG tablet Take 2 mg by mouth 2 (two) times daily.   Yes Historical Provider, MD  simvastatin (ZOCOR) 20 MG tablet Take 20 mg by mouth every evening.   Yes Historical Provider, MD  traZODone (DESYREL) 100 MG tablet Take 100 mg by mouth at bedtime.   Yes Historical Provider, MD  naproxen (NAPROSYN) 500 MG tablet Take 1 tablet (500 mg total) by mouth 2 (two) times daily with a meal. 03/22/16   Sherwood Gambler, MD  penicillin v potassium (VEETID) 500 MG tablet Take 2 tablets (1,000 mg total) by mouth 2 (two) times daily. X 7 days 03/22/16    Sherwood Gambler, MD    Family History Family History  Problem Relation Age of Onset  . Diabetes Father   . Cancer Maternal Grandfather     Social History Social History  Substance Use Topics  . Smoking status: Current Some Day Smoker    Packs/day: 0.50    Types: Cigarettes  . Smokeless tobacco: Never Used  . Alcohol use No     Allergies   Iohexol   Review of Systems Review of Systems  Constitutional: Negative for fever.  HENT: Positive for dental problem (left upper ) and facial swelling.   Neurological: Positive for headaches.  All other systems reviewed and are negative.    Physical Exam Updated Vital Signs BP 139/85 (BP Location: Left Arm)   Pulse 107   Temp 98 F (36.7 C) (Oral)   Resp 22   Ht 5\' 3"  (1.6 m)   Wt 155 lb (70.3 kg)   LMP 03/21/2016   SpO2 100%   BMI 27.46 kg/m   Physical Exam  Constitutional: She is oriented to person, place, and time. She appears well-developed and well-nourished.  HENT:  Head: Normocephalic and atraumatic.  Right Ear: External ear normal.  Left Ear: External ear normal.  Nose: Nose normal.  Mouth/Throat: Abnormal dentition. Dental caries present.    Mild gingiva erythema without obvious abcess or swelling above the left maxillary tooth in question Multiple missing teeth, especially left maxillary molars No facial swelling or tenderness  Eyes: Right eye exhibits no discharge. Left eye exhibits no discharge.  Neck: Normal range of motion. Neck supple.  Pulmonary/Chest: Effort normal.  Abdominal: She exhibits no distension. There is no tenderness.  Neurological: She is alert and oriented to person, place, and time.  Skin: Skin is warm and dry.  Nursing note and vitals reviewed.    ED Treatments / Results   DIAGNOSTIC STUDIES: Oxygen Saturation is 100% on RA, normal by my interpretation.    COORDINATION OF CARE: 3:45 PM Discussed treatment plan with pt at bedside which includes antibiotics and f/u with  Dentist and pt agreed to plan.   Labs (all labs ordered are listed, but only abnormal results are displayed) Labs Reviewed - No data to display  EKG  EKG Interpretation None       Radiology No results found.  Procedures Procedures (including critical care time)  Medications Ordered in ED Medications  ketorolac (TORADOL) 30 MG/ML injection 60 mg (30 mg Intramuscular Given 03/22/16 1552)  ketorolac (TORADOL) 30 MG/ML injection (30 mg Intramuscular Given 03/22/16 1559)     Initial Impression / Assessment and Plan / ED Course  I have reviewed the triage vital signs and the nursing notes.  Pertinent labs & imaging results that were available during my care of the patient were reviewed by me and considered in my medical decision  making (see chart for details).  Clinical Course     Overall patient appears well. There are no signs of significant deep space infection or concern for Ludwig angina. She is afebrile and clinically does not appear severely ill. I do not see a drainable abscess on exam. Given her complaints of drainage and some mild gingival erythema, treat with antibiotics. NSAIDs for pain. Follow-up with PCP and/or dentist. Discussed return precautions.  Final Clinical Impressions(s) / ED Diagnoses   Final diagnoses:  Pain, dental  Dental caries  Dental abscess    New Prescriptions New Prescriptions   NAPROXEN (NAPROSYN) 500 MG TABLET    Take 1 tablet (500 mg total) by mouth 2 (two) times daily with a meal.   PENICILLIN V POTASSIUM (VEETID) 500 MG TABLET    Take 2 tablets (1,000 mg total) by mouth 2 (two) times daily. X 7 days    I personally performed the services described in this documentation, which was scribed in my presence. The recorded information has been reviewed and is accurate.     Sherwood Gambler, MD 03/22/16 [phone]

## 2016-03-22 NOTE — ED Notes (Signed)
States had a tooth break off a few days ago, poor dental hygiene noted, upper teeth missing and broken and brown

## 2016-06-17 ENCOUNTER — Emergency Department (HOSPITAL_BASED_OUTPATIENT_CLINIC_OR_DEPARTMENT_OTHER)
Admission: EM | Admit: 2016-06-17 | Discharge: 2016-06-17 | Disposition: A | Discharge status: Other Acute Inpatient Hospital | Payer: BLUE CROSS/BLUE SHIELD | Attending: Emergency Medicine | Admitting: Emergency Medicine

## 2016-06-17 ENCOUNTER — Encounter (HOSPITAL_BASED_OUTPATIENT_CLINIC_OR_DEPARTMENT_OTHER): Payer: Self-pay

## 2016-06-17 ENCOUNTER — Emergency Department (HOSPITAL_BASED_OUTPATIENT_CLINIC_OR_DEPARTMENT_OTHER): Payer: BLUE CROSS/BLUE SHIELD

## 2016-06-17 DIAGNOSIS — R0781 Pleurodynia: Secondary | ICD-10-CM | POA: Insufficient documentation

## 2016-06-17 DIAGNOSIS — Z7984 Long term (current) use of oral hypoglycemic drugs: Secondary | ICD-10-CM | POA: Insufficient documentation

## 2016-06-17 DIAGNOSIS — E109 Type 1 diabetes mellitus without complications: Secondary | ICD-10-CM | POA: Insufficient documentation

## 2016-06-17 DIAGNOSIS — F17210 Nicotine dependence, cigarettes, uncomplicated: Secondary | ICD-10-CM | POA: Insufficient documentation

## 2016-06-17 DIAGNOSIS — E039 Hypothyroidism, unspecified: Secondary | ICD-10-CM | POA: Insufficient documentation

## 2016-06-17 DIAGNOSIS — I10 Essential (primary) hypertension: Secondary | ICD-10-CM | POA: Insufficient documentation

## 2016-06-17 DIAGNOSIS — R079 Chest pain, unspecified: Secondary | ICD-10-CM

## 2016-06-17 LAB — BASIC METABOLIC PANEL
Anion gap: 11 (ref 5–15)
BUN: 12 mg/dL (ref 6–20)
CHLORIDE: 103 mmol/L (ref 101–111)
CO2: 22 mmol/L (ref 22–32)
CREATININE: 0.74 mg/dL (ref 0.44–1.00)
Calcium: 8.8 mg/dL — ABNORMAL LOW (ref 8.9–10.3)
GFR calc Af Amer: >60 mL/min (ref >60)
GFR calc non Af Amer: >60 mL/min (ref >60)
GLUCOSE: 167 mg/dL — AB (ref 65–99)
Potassium: 2.7 mmol/L — CL (ref 3.5–5.1)
Sodium: 136 mmol/L (ref 135–145)

## 2016-06-17 LAB — CBC
HEMATOCRIT: 30.9 % — AB (ref 36.0–46.0)
Hemoglobin: 10.6 g/dL — ABNORMAL LOW (ref 12.0–15.0)
MCH: 31.5 pg (ref 26.0–34.0)
MCHC: 34.3 g/dL (ref 30.0–36.0)
MCV: 91.7 fL (ref 78.0–100.0)
Platelets: 369 10*3/uL (ref 150–400)
RBC: 3.37 MIL/uL — ABNORMAL LOW (ref 3.87–5.11)
RDW: 14.9 % (ref 11.5–15.5)
WBC: 7.2 10*3/uL (ref 4.0–10.5)

## 2016-06-17 LAB — D-DIMER, QUANTITATIVE: D-Dimer, Quant: <0.27 ug/mL-FEU (ref 0.00–0.50)

## 2016-06-17 LAB — TROPONIN I: Troponin I: <0.03 ng/mL (ref <0.03)

## 2016-06-17 IMAGING — CR DG CHEST 2V
2 series · 2 of 2 positions shown · non-contrast
Comparison: [DATE]

CLINICAL DATA: Chest pain intermittent x 3 hours-over hear
especially with a deep breath; incident about a yr ago with same
symptoms; smoker

EXAM:
CHEST - 2 VIEW

[w chest pa]
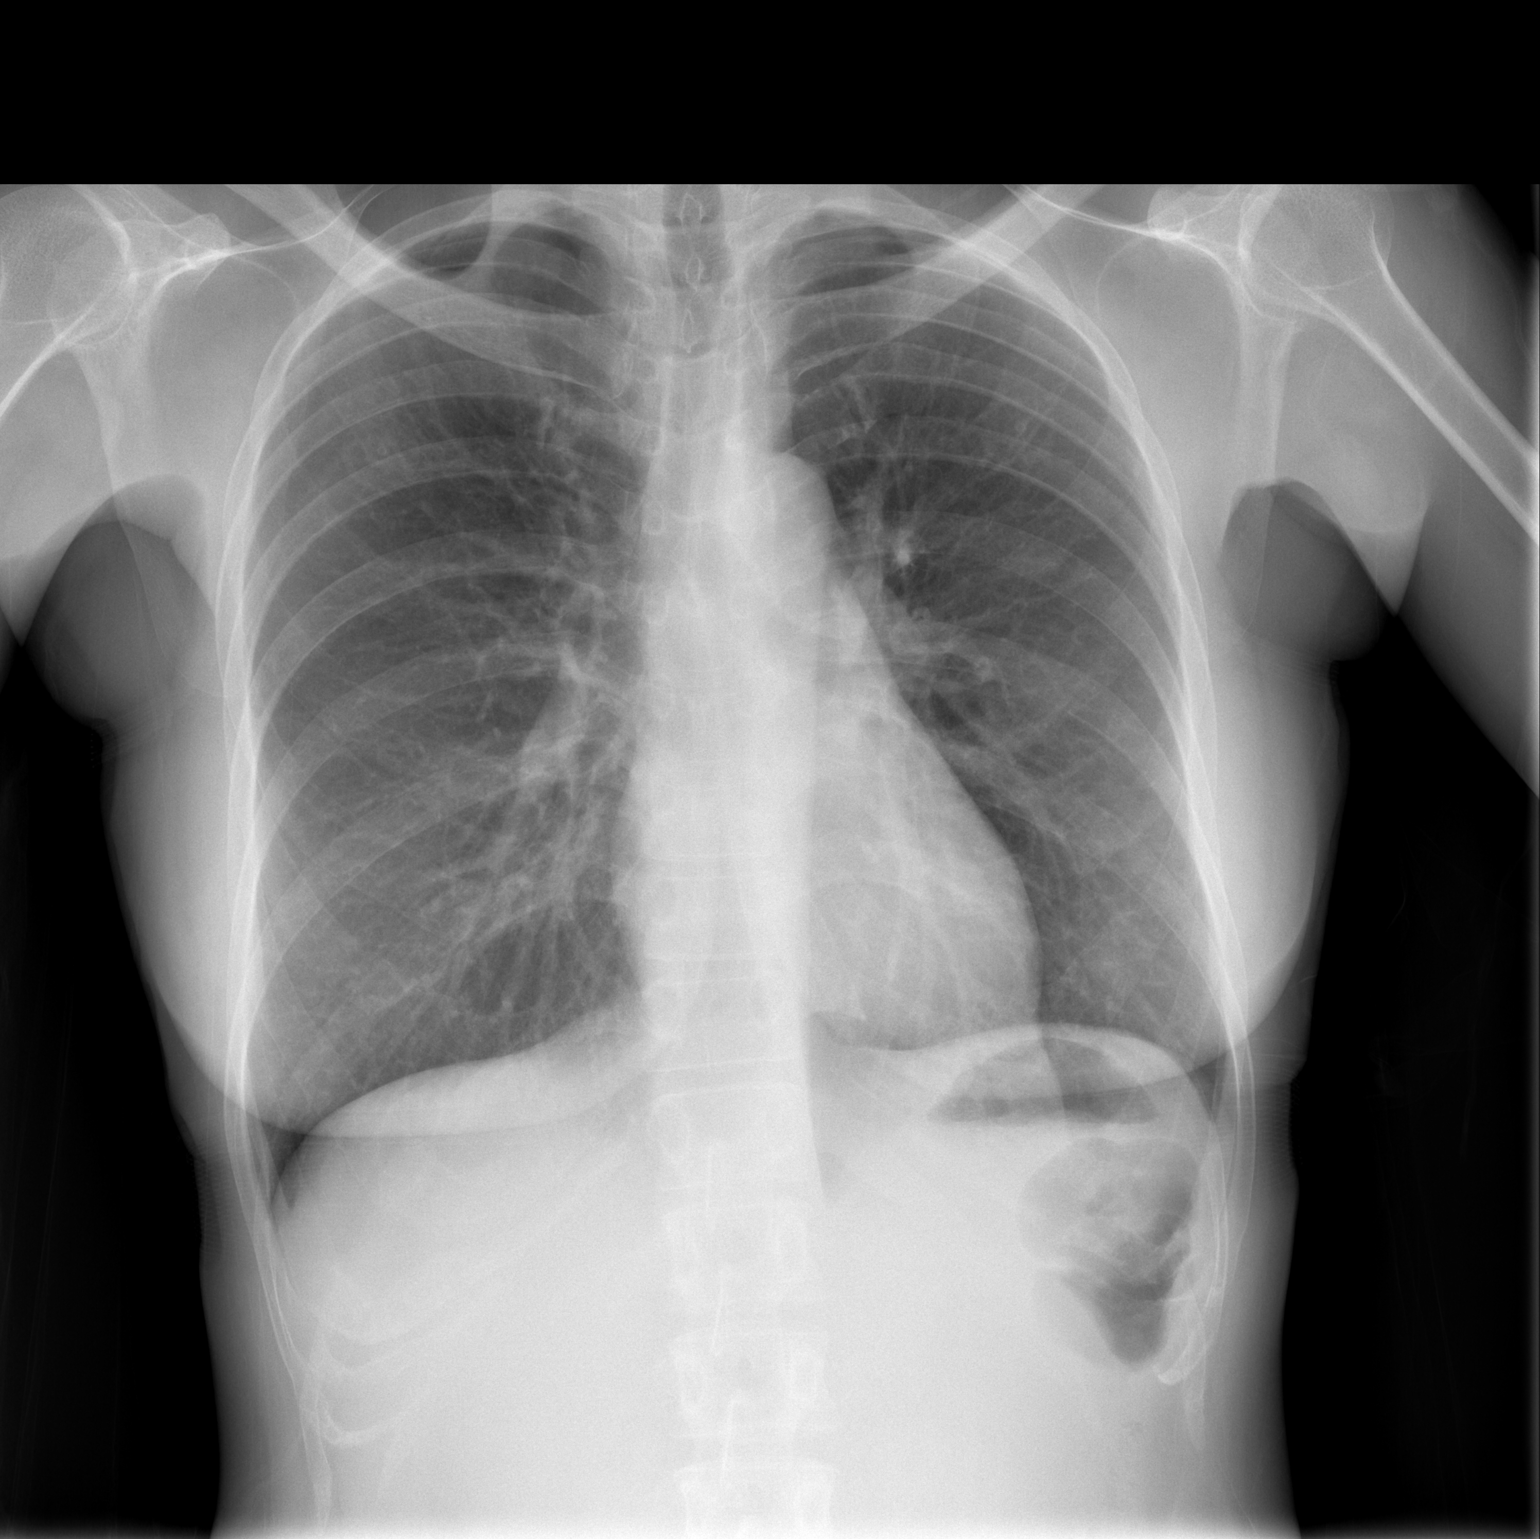

[w chest lat]
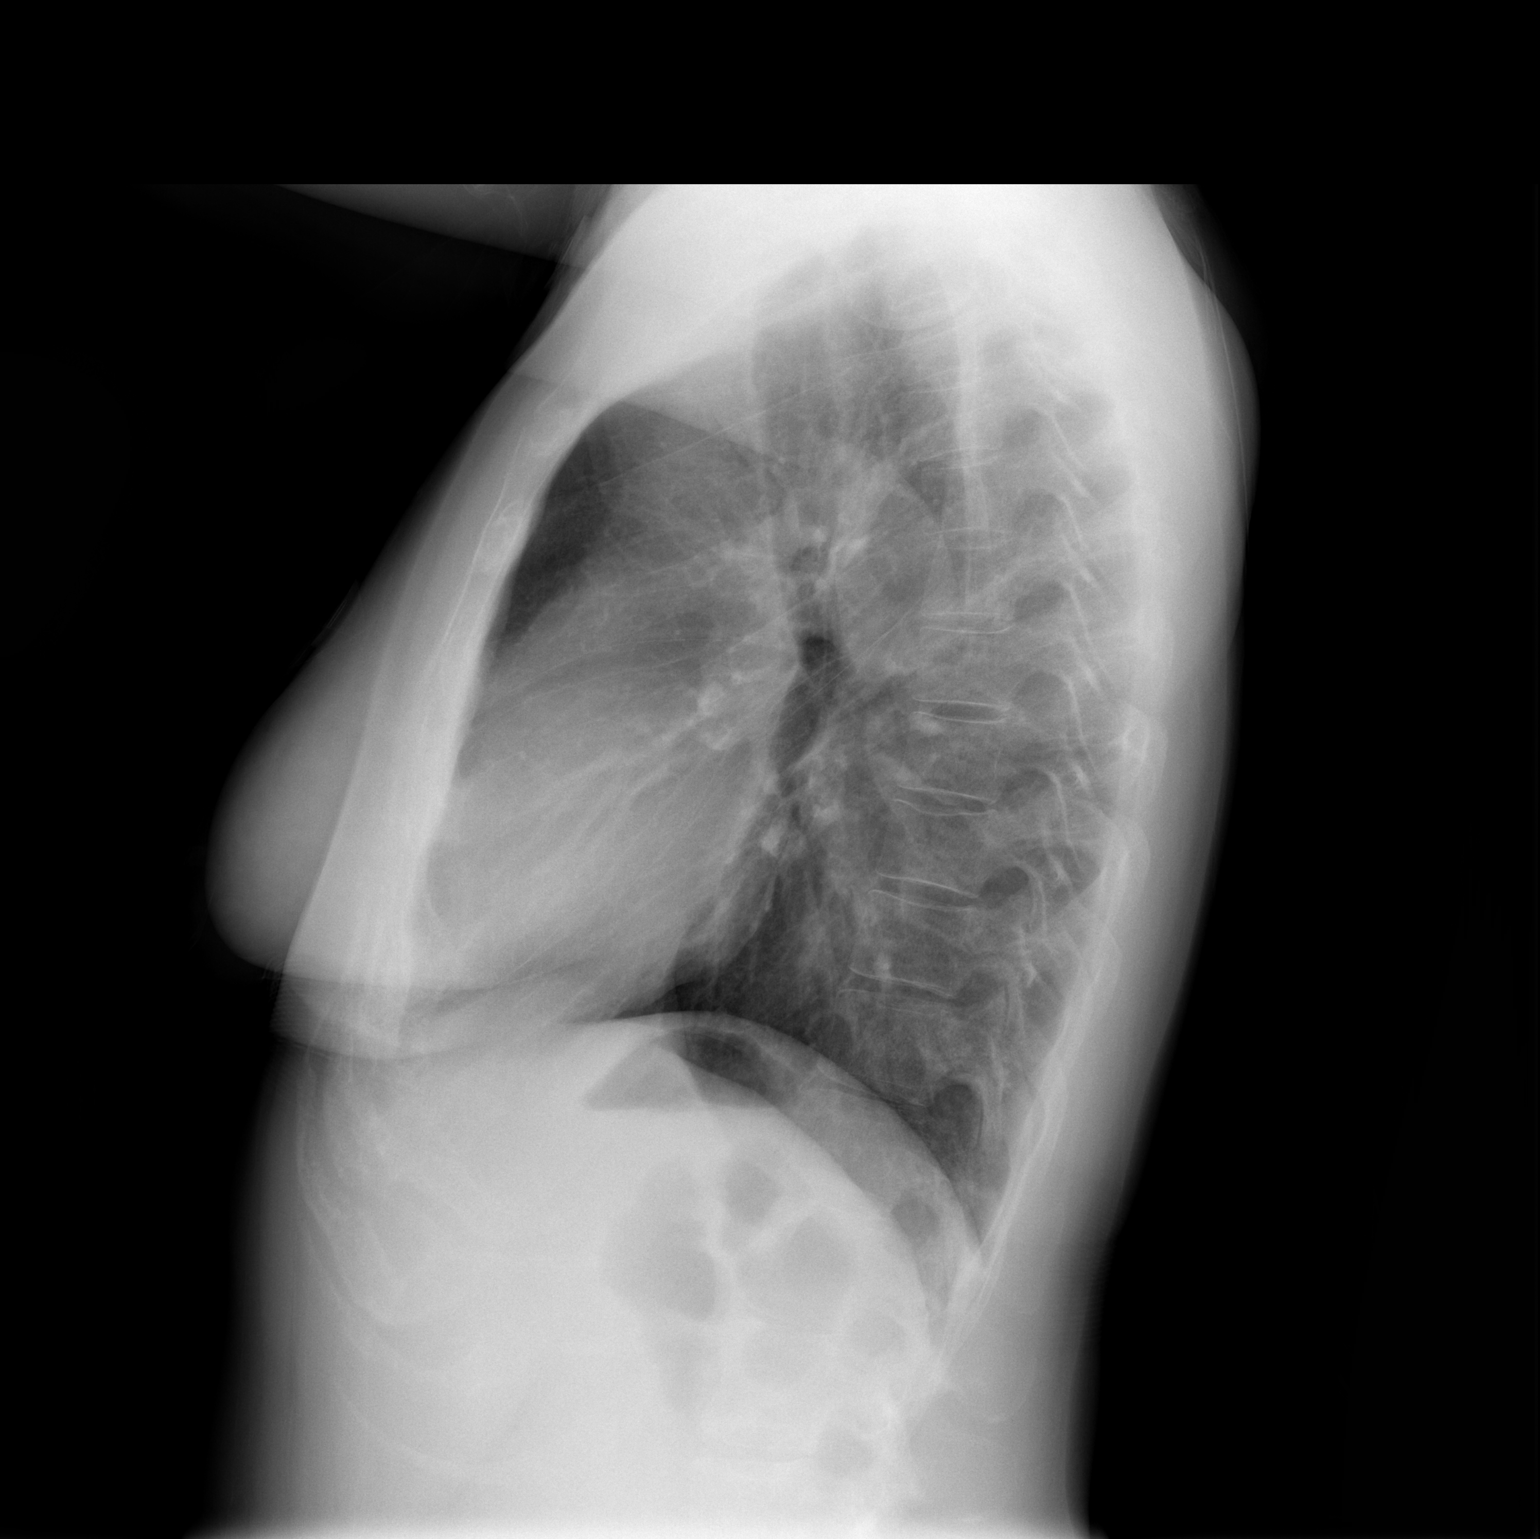

[2 of 2 positions shown; findings below may reference images not displayed]

FINDINGS: Lungs are clear.

Heart size and mediastinal contours are within normal limits.

No effusion.

Visualized bones unremarkable.
IMPRESSION: No acute cardiopulmonary disease.

## 2016-06-17 MED ORDER — ASPIRIN 81 MG PO CHEW
324.0000 mg | CHEWABLE_TABLET | Freq: Once | ORAL | Status: AC
  Administered 2016-06-17: 324 mg via ORAL
  Filled 2016-06-17: qty 4

## 2016-06-17 MED ORDER — POTASSIUM CHLORIDE CRYS ER 20 MEQ PO TBCR
40.0000 meq | EXTENDED_RELEASE_TABLET | Freq: Once | ORAL | Status: AC
  Administered 2016-06-17: 40 meq via ORAL
  Filled 2016-06-17: qty 2

## 2016-06-17 NOTE — ED Notes (Signed)
[facility] here to transport pt to [facility]. Care turned over to [facility].

## 2016-06-17 NOTE — ED Notes (Addendum)
Report given to [place] at [facility]. Also reported to RN that phenytoin level and 1845 troponin was not done.

## 2016-06-17 NOTE — ED Provider Notes (Signed)
[facility] DEPT [facility] Provider Note   CSN: [id number] Arrival date & time: 06/17/16  1349     History   Chief Complaint Chief Complaint  Patient presents with  . Chest Pain    HPI Zoe Clay is a 41 y.o. female with history of bipolar 1 disorder, diabetes, hypertension, migraines who presents with a 2 day history of left-sided chest pain. Patient states her chest pain has been intermittent for the past 3 days. She describes it as sharp. Patient has had associated diaphoresis with her symptoms. She has had some pleuritic pain with associated lightheadedness. Patient has had pain radiation to her left arm. Patient denies any shortness of breath. Patient has neuropathy and does have peripheral edema at baseline. Patient denies any new leg pain or swelling, recent long trips, surgeries, cancer, exogenous estrogen use. Patient is a smoker. Patient denies any abdominal pain, nausea, vomiting or urinary symptoms.  HPI  Past Medical History:  Diagnosis Date  . Bipolar 1 disorder ([place])   . Diabetes mellitus   . High cholesterol   . Hypertension   . Peripheral neuropathy ([place])   . Seizures ([place])    last seizure May 2016  . Thyroid disease     Patient Active Problem List   Diagnosis Date Noted  . DKA (diabetic ketoacidoses) ([place]) 06/20/2012  . Nausea with vomiting 06/20/2012  . 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 Seizure disorder ([place]) 06/20/2012  . Unspecified hypothyroidism 06/20/2012  . Hyponatremia 06/20/2012    Past Surgical History:  Procedure Laterality Date  . ANKLE SURGERY    . TUBAL LIGATION      OB History    No data available       Home Medications    Prior to Admission medications   Medication Sig Start Date End Date Taking? Authorizing Provider  busPIRone (BUSPAR) 5 MG tablet Take 5 mg by mouth 2 (two) times daily.    Historical Provider, MD  DULoxetine (CYMBALTA) 60 MG capsule Take 60 mg by mouth  daily.    Historical Provider, MD  gabapentin (NEURONTIN) 600 MG tablet Take 1 tablet (600 mg total) by mouth 2 (two) times daily. 01/10/15   Katheren Shams, DO  hydrOXYzine (ATARAX/VISTARIL) 25 MG tablet Take 1 tablet (25 mg total) by mouth every 6 (six) hours. 12/13/15   Shawn C Joy, PA-C  insulin aspart (NOVOLOG) 100 UNIT/ML injection Inject into the skin 3 (three) times daily before meals.    Historical Provider, MD  insulin glargine (LANTUS) 100 UNIT/ML injection Inject 15 Units into the skin daily. 06/22/12   Belkys A Regalado, MD  levothyroxine (SYNTHROID, LEVOTHROID) 125 MCG tablet Take 125 mcg by mouth daily.      Historical Provider, MD  metFORMIN (GLUCOPHAGE) 500 MG tablet Take by mouth 2 (two) times daily with a meal.    Historical Provider, MD  phenytoin (DILANTIN) 100 MG ER capsule Take 200 mg by mouth 2 (two) times daily.     Historical Provider, MD  risperiDONE (RISPERDAL) 2 MG tablet Take 2 mg by mouth 2 (two) times daily.    Historical Provider, MD  simvastatin (ZOCOR) 20 MG tablet Take 20 mg by mouth every evening.    Historical Provider, MD  traZODone (DESYREL) 100 MG tablet Take 100 mg by mouth at bedtime.    Historical Provider, MD    Family History Family History  Problem Relation Age of Onset  . Diabetes Father   . Cancer  Maternal Grandfather     Social History Social History  Substance Use Topics  . Smoking status: Current Some Day Smoker    Packs/day: 0.50    Types: Cigarettes  . Smokeless tobacco: Never Used  . Alcohol use No     Allergies   Iodine and Iohexol   Review of Systems Review of Systems  Constitutional: Positive for diaphoresis. Negative for chills and fever.  HENT: Negative for facial swelling and sore throat.   Respiratory: Negative for shortness of breath.   Cardiovascular: Positive for chest pain and leg swelling.  Gastrointestinal: Negative for abdominal pain, nausea and vomiting.  Genitourinary: Negative for dysuria.    Musculoskeletal: Negative for back pain.  Skin: Negative for rash and wound.  Neurological: Negative for headaches.  Psychiatric/Behavioral: The patient is not nervous/anxious.      Physical Exam Updated Vital Signs BP 127/78   Pulse 95   Temp 98.2 F (36.8 C) (Oral)   Resp 16   Ht 5\' 5"  (1.651 m)   Wt 68 kg   LMP 05/19/2016   SpO2 100%   BMI 24.96 kg/m   Physical Exam  Constitutional: She appears well-developed and well-nourished. No distress.  HENT:  Head: Normocephalic and atraumatic.  Mouth/Throat: Oropharynx is clear and moist. No oropharyngeal exudate.  Eyes: Conjunctivae are normal. Pupils are equal, round, and reactive to light. Right eye exhibits no discharge. Left eye exhibits no discharge. No scleral icterus.  Neck: Normal range of motion. Neck supple. No thyromegaly present.  Cardiovascular: Normal rate, regular rhythm, normal heart sounds and intact distal pulses.  Exam reveals no gallop and no friction rub.   No murmur heard. Pulmonary/Chest: Effort normal and breath sounds normal. No stridor. No respiratory distress. She has no wheezes. She has no rales. She exhibits tenderness (mild L sided).  Abdominal: Soft. Bowel sounds are normal. She exhibits no distension. There is no tenderness. There is no rebound and no guarding.  Musculoskeletal: She exhibits no edema.  Lymphadenopathy:    She has no cervical adenopathy.  Neurological: She is alert. Coordination normal.  Skin: Skin is warm and dry. No rash noted. She is not diaphoretic. No pallor.  Psychiatric: She has a normal mood and affect.  Nursing note and vitals reviewed.    ED Treatments / Results  Labs (all labs ordered are listed, but only abnormal results are displayed) Labs Reviewed  CBC - Abnormal; Notable for the following:       Result Value   RBC 3.37 (*)    Hemoglobin 10.6 (*)    HCT 30.9 (*)    All other components within normal limits  BASIC METABOLIC PANEL - Abnormal; Notable for the  following:    Potassium 2.7 (*)    Glucose, Bld 167 (*)    Calcium 8.8 (*)    All other components within normal limits  TROPONIN I  D-DIMER, QUANTITATIVE (NOT AT [facility])  TROPONIN I  PHENYTOIN LEVEL, FREE AND TOTAL    EKG  EKG Interpretation  Date/Time:  Tuesday June 17 2016 13:55:05 EST Ventricular Rate:  98 PR Interval:  134 QRS Duration: 84 QT Interval:  356 QTC Calculation: 454 R Axis:   77 Text Interpretation:  Normal sinus rhythm T wave abnormality, consider inferior ischemia Abnormal ECG Since previous tracing rate slower, t wave inversions were present on prior ekg Confirmed by Canary Brim  MD, MARTHA [phone]) on 06/17/2016 2:45:27 PM       Radiology Dg Chest 2 View  Result Date:  06/17/2016 CLINICAL DATA:  Chest pain intermittent x 3 hours-over hear especially with a deep breath; incident about a yr ago with same symptoms; smoker EXAM: CHEST - 2 VIEW COMPARISON:  04/09/2016 FINDINGS: Lungs are clear. Heart size and mediastinal contours are within normal limits. No effusion. Visualized bones unremarkable. IMPRESSION: No acute cardiopulmonary disease. Electronically Signed   By: Lucrezia Europe M.D.   On: 06/17/2016 14:52    Procedures Procedures (including critical care time)  Medications Ordered in ED Medications  potassium chloride SA (K-DUR,KLOR-CON) CR tablet 40 mEq (40 mEq Oral Given 06/17/16 1645)  aspirin chewable tablet 324 mg (324 mg Oral Given 06/17/16 1645)     Initial Impression / Assessment and Plan / ED Course  I have reviewed the triage vital signs and the nursing notes.  Pertinent labs & imaging results that were available during my care of the patient were reviewed by me and considered in my medical decision making (see chart for details).     CBC shows hemoglobin 10.6. BMP shows potassium 2.7, initiated replacement in the ED. First troponin negative. D-dimer negative. CXR shows no active cardiopulmonary disease. EKG shows nonspecific T-wave abnormalities, which  have been present in the past. HEAR score 4. Patient with many risk factors with HEAR score suggesting admission for cardiac rule out. Patient requested Transfer to [facility]. I spoke with Dr. Denton Brick, a [facility] hospitalist, who will admit the patient to telemetry for chest pain rule out. I discussed patient case with Dr. Canary Brim who guided the patient's management and agrees with plan.  Final Clinical Impressions(s) / ED Diagnoses   Final diagnoses:  Chest pain, unspecified type    New Prescriptions New Prescriptions   No medications on file     Frederica Kuster, PA-C 06/17/16 [place], MD 06/18/16 [phone]

## 2016-06-17 NOTE — ED Notes (Signed)
Potassium 2.7 relayed to PA.

## 2016-06-17 NOTE — ED Triage Notes (Signed)
CP x 2 days-NAD-steady gait 

## 2016-06-17 NOTE — ED Notes (Signed)
Report given to [facility] with [facility].

## 2016-07-12 ENCOUNTER — Encounter (HOSPITAL_BASED_OUTPATIENT_CLINIC_OR_DEPARTMENT_OTHER): Payer: Self-pay | Admitting: Emergency Medicine

## 2016-07-12 ENCOUNTER — Emergency Department (HOSPITAL_BASED_OUTPATIENT_CLINIC_OR_DEPARTMENT_OTHER)
Admission: EM | Admit: 2016-07-12 | Discharge: 2016-07-12 | Disposition: A | Discharge status: Home / Self Care | Payer: BLUE CROSS/BLUE SHIELD | Attending: Emergency Medicine | Admitting: Emergency Medicine

## 2016-07-12 DIAGNOSIS — E039 Hypothyroidism, unspecified: Secondary | ICD-10-CM | POA: Insufficient documentation

## 2016-07-12 DIAGNOSIS — Z794 Long term (current) use of insulin: Secondary | ICD-10-CM | POA: Insufficient documentation

## 2016-07-12 DIAGNOSIS — E119 Type 2 diabetes mellitus without complications: Secondary | ICD-10-CM | POA: Insufficient documentation

## 2016-07-12 DIAGNOSIS — N3000 Acute cystitis without hematuria: Secondary | ICD-10-CM | POA: Insufficient documentation

## 2016-07-12 DIAGNOSIS — I10 Essential (primary) hypertension: Secondary | ICD-10-CM | POA: Insufficient documentation

## 2016-07-12 DIAGNOSIS — Z79899 Other long term (current) drug therapy: Secondary | ICD-10-CM | POA: Insufficient documentation

## 2016-07-12 DIAGNOSIS — F17210 Nicotine dependence, cigarettes, uncomplicated: Secondary | ICD-10-CM | POA: Insufficient documentation

## 2016-07-12 LAB — URINALYSIS, ROUTINE W REFLEX MICROSCOPIC
Glucose, UA: NEGATIVE mg/dL
Ketones, ur: 15 mg/dL — AB
NITRITE: POSITIVE — AB
PH: 5.5 (ref 5.0–8.0)
Protein, ur: 100 mg/dL — AB
Specific Gravity, Urine: 1.026 (ref 1.005–1.030)

## 2016-07-12 LAB — URINALYSIS, MICROSCOPIC (REFLEX)

## 2016-07-12 LAB — PREGNANCY, URINE: PREG TEST UR: NEGATIVE

## 2016-07-12 MED ORDER — NITROFURANTOIN MONOHYD MACRO 100 MG PO CAPS: 100.0000 mg | ORAL_CAPSULE | Freq: Two times a day (BID) | ORAL | 0 refills | Status: DC

## 2016-07-12 MED ORDER — PHENAZOPYRIDINE HCL 200 MG PO TABS: 200.0000 mg | ORAL_TABLET | Freq: Three times a day (TID) | ORAL | 0 refills | Status: DC

## 2016-07-12 MED ORDER — NITROFURANTOIN MONOHYD MACRO 100 MG PO CAPS
100.0000 mg | ORAL_CAPSULE | Freq: Once | ORAL | Status: AC
  Administered 2016-07-12: 100 mg via ORAL
  Filled 2016-07-12: qty 1

## 2016-07-12 MED ORDER — PHENAZOPYRIDINE HCL 100 MG PO TABS
200.0000 mg | ORAL_TABLET | Freq: Once | ORAL | Status: AC
  Administered 2016-07-12: 200 mg via ORAL
  Filled 2016-07-12: qty 2

## 2016-07-12 NOTE — ED Provider Notes (Signed)
[facility] DEPT [facility] Provider Note   CSN: [id number] Arrival date & time: 07/12/16  1336  By signing my name below, I, Zoe Clay, attest that this documentation has been prepared under the direction and in the presence of non-physician practitioner, Alecia Lemming PA-C. Electronically Signed: Dolores Clay, Scribe. 07/12/2016. 4:19 PM.  History   Chief Complaint Chief Complaint  Patient presents with  . Back Pain  . Abdominal Pain   The history is provided by the patient. No language interpreter was used.    HPI Comments:   Zoe Clay is a 41 y.o. female with pmhx of IDDM, HTN, and HLD who presents to the Emergency Department complaining of constant, moderate abdominal pain onset 7 days ago. Pt describes her pain as beginning in her lower abdomen and radiating into her lower back. She reports associated nausea, headache, intermittent CP and urinary frequency. Pt has tried ibuprofen and tylenol at home, which has exacerbated her symptoms. She denies any vomiting, fever, SOB or dysuria.  Previous shx of tubal ligation.  Seen at [facility] on 3/25 -- neg UA at that time. Her work-up focused on her high blood sugar which was Treated with insulin and fluids. Labs otherwise unremarkable.   Past Medical History:  Diagnosis Date  . Bipolar 1 disorder ([place])   . Diabetes mellitus   . High cholesterol   . Hypertension   . Peripheral neuropathy ([place])   . Seizures ([place])    last seizure May 2016  . Thyroid disease     Patient Active Problem List   Diagnosis Date Noted  . DKA (diabetic ketoacidoses) ([place]) 06/20/2012  . Nausea with vomiting 06/20/2012  . 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 Seizure disorder ([place]) 06/20/2012  . Unspecified hypothyroidism 06/20/2012  . Hyponatremia 06/20/2012    Past Surgical History:  Procedure Laterality Date  . ANKLE SURGERY    . TUBAL LIGATION      OB History    No data available        Home Medications    Prior to Admission medications   Medication Sig Start Date End Date Taking? Authorizing Provider  busPIRone (BUSPAR) 5 MG tablet Take 5 mg by mouth 2 (two) times daily.   Yes Historical Provider, MD  DULoxetine (CYMBALTA) 60 MG capsule Take 60 mg by mouth daily.   Yes Historical Provider, MD  gabapentin (NEURONTIN) 600 MG tablet Take 1 tablet (600 mg total) by mouth 2 (two) times daily. 01/10/15  Yes Katheren Shams, DO  hydrOXYzine (ATARAX/VISTARIL) 25 MG tablet Take 1 tablet (25 mg total) by mouth every 6 (six) hours. 12/13/15  Yes Shawn C Joy, PA-C  insulin aspart (NOVOLOG) 100 UNIT/ML injection Inject into the skin 3 (three) times daily before meals.   Yes Historical Provider, MD  insulin glargine (LANTUS) 100 UNIT/ML injection Inject 15 Units into the skin daily. Patient taking differently: Inject 20 Units into the skin daily.  06/22/12  Yes Belkys A Regalado, MD  levothyroxine (SYNTHROID, LEVOTHROID) 125 MCG tablet Take 125 mcg by mouth daily.     Yes Historical Provider, MD  metFORMIN (GLUCOPHAGE) 500 MG tablet Take by mouth 2 (two) times daily with a meal.   Yes Historical Provider, MD  phenytoin (DILANTIN) 100 MG ER capsule Take 200 mg by mouth 2 (two) times daily.    Yes Historical Provider, MD  risperiDONE (RISPERDAL) 2 MG tablet Take 2 mg by mouth 2 (two) times daily.   Yes Historical  Provider, MD  simvastatin (ZOCOR) 20 MG tablet Take 20 mg by mouth every evening.   Yes Historical Provider, MD  traZODone (DESYREL) 100 MG tablet Take 100 mg by mouth at bedtime.   Yes Historical Provider, MD    Family History Family History  Problem Relation Age of Onset  . Diabetes Father   . Cancer Maternal Grandfather     Social History Social History  Substance Use Topics  . Smoking status: Current Some Day Smoker    Packs/day: 0.50    Types: Cigarettes  . Smokeless tobacco: Never Used  . Alcohol use No     Allergies   Iodine and Iohexol   Review of  Systems Review of Systems  Constitutional: Negative for fever.  HENT: Negative for rhinorrhea and sore throat.   Eyes: Negative for redness.  Respiratory: Negative for cough and shortness of breath.   Cardiovascular: Positive for chest pain (Intermittent, none currently).  Gastrointestinal: Positive for abdominal pain and nausea. Negative for diarrhea and vomiting.  Genitourinary: Positive for urgency. Negative for dysuria and frequency.  Musculoskeletal: Positive for back pain. Negative for myalgias.  Skin: Negative for rash.  Neurological: Positive for headaches.     Physical Exam Updated Vital Signs BP 130/84 (BP Location: Right Arm)   Pulse 94   Temp 98.1 F (36.7 C) (Oral)   Resp 18   Ht 5\' 5"  (1.651 m)   Wt 150 lb (68 kg)   LMP 05/18/2016   SpO2 100%   BMI 24.96 kg/m   Physical Exam  Constitutional: She appears well-developed and well-nourished. No distress.  HENT:  Head: Normocephalic and atraumatic.  Eyes: Conjunctivae are normal. Right eye exhibits no discharge. Left eye exhibits no discharge.  Neck: Normal range of motion. Neck supple.  Cardiovascular: Normal rate, regular rhythm and normal heart sounds.   Pulmonary/Chest: Effort normal and breath sounds normal. She has no wheezes.  Abdominal: Soft. She exhibits no distension. There is tenderness (Mild) in the suprapubic area. There is no rebound, no guarding and no CVA tenderness.  Suprapubic abdominal tenderness   Neurological: She is alert.  Skin: Skin is warm and dry.  Psychiatric: She has a normal mood and affect.  Nursing note and vitals reviewed.    ED Treatments / Results  DIAGNOSTIC STUDIES:  Oxygen Saturation is 100% on RA, normal by my interpretation.    COORDINATION OF CARE:  4:25 PM Discussed treatment plan with pt at bedside which includes results of Urinalysis and dx of UTI. Encouraged pt to keep track of her blood sugar and discussed return precautions. Pt agreed to plan. We reviewed  previous workup from [facility] regional.  Labs (all labs ordered are listed, but only abnormal results are displayed) Labs Reviewed  URINALYSIS, ROUTINE W REFLEX MICROSCOPIC - Abnormal; Notable for the following:       Result Value   Color, Urine AMBER (*)    APPearance CLOUDY (*)    Hgb urine dipstick TRACE (*)    Bilirubin Urine SMALL (*)    Ketones, ur 15 (*)    Protein, ur 100 (*)    Nitrite POSITIVE (*)    Leukocytes, UA LARGE (*)    All other components within normal limits  URINALYSIS, MICROSCOPIC (REFLEX) - Abnormal; Notable for the following:    Bacteria, UA MANY (*)    Squamous Epithelial / LPF 0-5 (*)    All other components within normal limits  PREGNANCY, URINE    EKG  EKG Interpretation None  Radiology No results found.  Procedures Procedures (including critical care time)  Medications Ordered in ED Medications - No data to display   Initial Impression / Assessment and Plan / ED Course  I have reviewed the triage vital signs and the nursing notes.  Pertinent labs & imaging results that were available during my care of the patient were reviewed by me and considered in my medical decision making (see chart for details).     Patient seen and examined.   Vital signs reviewed and are as follows: BP 130/84 (BP Location: Right Arm)   Pulse 94   Temp 98.1 F (36.7 C) (Oral)   Resp 18   Ht 5\' 5"  (1.651 m)   Wt 68 kg   LMP 05/18/2016   SpO2 100%   BMI 24.96 kg/m   Patients symptoms today are most consistent with urinary tract infection which is obvious on the UA. No signs of pyelonephritis. Patient is a diabetic and has no current clinical symptoms of DKA including generalized abdominal pain or vomiting. Do not feel that further workup for this is indicated at this time. Patient appears well, nontoxic.   Final Clinical Impressions(s) / ED Diagnoses   Final diagnoses:  Acute cystitis without hematuria    New Prescriptions New  Prescriptions   No medications on file  I personally performed the services described in this documentation, which was scribed in my presence. The recorded information has been reviewed and is accurate.     Carlisle Cater, PA-C 07/12/16 [place], MD 07/12/16 [phone]

## 2016-07-12 NOTE — ED Triage Notes (Addendum)
Left flank pain and LLQ pain since last Sunday was eval at [facility] ED for same, ? Cause. Also having a h/a. Took Ibuprofen 2 tabs at [facility]

## 2016-07-12 NOTE — Discharge Instructions (Signed)
Please read and follow all provided instructions.  Your diagnoses today include:  1. Acute cystitis without hematuria     Tests performed today include:  Urine test - suggests that you have an infection in your bladder  Pregnancy test - negative  Vital signs. See below for your results today.   Medications prescribed:   Macrobid - antibiotic for urinary tract infection  You have been prescribed an antibiotic medicine: take the entire course of medicine even if you are feeling better. Stopping early can cause the antibiotic not to work.   Pyridium - medication for urinary tract infection symptoms.   This medication will turn your urine orange. This is normal.   Home care instructions:  Follow any educational materials contained in this packet.  Follow-up instructions: Please follow-up with your primary care provider in 3 days if symptoms are not resolved for further evaluation of your symptoms.  Return instructions:   Please return to the Emergency Department if you experience worsening symptoms.   Return with fever, worsening pain, persistent vomiting, worsening pain in your back.   Please return if you have any other emergent concerns.  Additional Information:  Your vital signs today were: BP 130/84 (BP Location: Right Arm)    Pulse 94    Temp 98.1 F (36.7 C) (Oral)    Resp 18    Ht 5\' 5"  (1.651 m)    Wt 68 kg    LMP 05/18/2016    SpO2 100%    BMI 24.96 kg/m  If your blood pressure (BP) was elevated above 135/85 this visit, please have this repeated by your doctor within one month. --------------

## 2016-08-11 ENCOUNTER — Encounter (HOSPITAL_BASED_OUTPATIENT_CLINIC_OR_DEPARTMENT_OTHER): Payer: Self-pay | Admitting: *Deleted

## 2016-08-11 ENCOUNTER — Emergency Department (HOSPITAL_BASED_OUTPATIENT_CLINIC_OR_DEPARTMENT_OTHER)
Admission: EM | Admit: 2016-08-11 | Discharge: 2016-08-11 | Disposition: A | Discharge status: Other Acute Inpatient Hospital | Payer: Self-pay | Attending: Emergency Medicine | Admitting: Emergency Medicine

## 2016-08-11 DIAGNOSIS — E1010 Type 1 diabetes mellitus with ketoacidosis without coma: Secondary | ICD-10-CM | POA: Insufficient documentation

## 2016-08-11 DIAGNOSIS — Z794 Long term (current) use of insulin: Secondary | ICD-10-CM | POA: Insufficient documentation

## 2016-08-11 DIAGNOSIS — I10 Essential (primary) hypertension: Secondary | ICD-10-CM | POA: Insufficient documentation

## 2016-08-11 DIAGNOSIS — E039 Hypothyroidism, unspecified: Secondary | ICD-10-CM | POA: Insufficient documentation

## 2016-08-11 DIAGNOSIS — F17210 Nicotine dependence, cigarettes, uncomplicated: Secondary | ICD-10-CM | POA: Insufficient documentation

## 2016-08-11 DIAGNOSIS — R000 Tachycardia, unspecified: Secondary | ICD-10-CM | POA: Insufficient documentation

## 2016-08-11 LAB — BRAIN NATRIURETIC PEPTIDE: B NATRIURETIC PEPTIDE 5: 147.4 pg/mL — AB (ref 0.0–100.0)

## 2016-08-11 LAB — CBC
HCT: 34.4 % — ABNORMAL LOW (ref 36.0–46.0)
Hemoglobin: 11.8 g/dL — ABNORMAL LOW (ref 12.0–15.0)
MCH: 31.6 pg (ref 26.0–34.0)
MCHC: 34.3 g/dL (ref 30.0–36.0)
MCV: 92 fL (ref 78.0–100.0)
PLATELETS: 402 10*3/uL — AB (ref 150–400)
RBC: 3.74 MIL/uL — ABNORMAL LOW (ref 3.87–5.11)
RDW: 15.3 % (ref 11.5–15.5)
WBC: 8.4 10*3/uL (ref 4.0–10.5)

## 2016-08-11 LAB — URINALYSIS, ROUTINE W REFLEX MICROSCOPIC
BILIRUBIN URINE: NEGATIVE
Glucose, UA: >=500 mg/dL — AB
LEUKOCYTES UA: NEGATIVE
NITRITE: NEGATIVE
PROTEIN: NEGATIVE mg/dL
Specific Gravity, Urine: 1.023 (ref 1.005–1.030)
pH: 5 (ref 5.0–8.0)

## 2016-08-11 LAB — BASIC METABOLIC PANEL
Anion gap: 19 — ABNORMAL HIGH (ref 5–15)
BUN: 15 mg/dL (ref 6–20)
CALCIUM: 9.1 mg/dL (ref 8.9–10.3)
CHLORIDE: 98 mmol/L — AB (ref 101–111)
CO2: 19 mmol/L — ABNORMAL LOW (ref 22–32)
CREATININE: 0.88 mg/dL (ref 0.44–1.00)
Glucose, Bld: 320 mg/dL — ABNORMAL HIGH (ref 65–99)
Potassium: 3.3 mmol/L — ABNORMAL LOW (ref 3.5–5.1)
SODIUM: 136 mmol/L (ref 135–145)

## 2016-08-11 LAB — URINALYSIS, MICROSCOPIC (REFLEX)

## 2016-08-11 LAB — CBG MONITORING, ED
Glucose-Capillary: 423 mg/dL — ABNORMAL HIGH (ref 65–99)
Glucose-Capillary: 101 mg/dL — ABNORMAL HIGH (ref 65–99)

## 2016-08-11 LAB — I-STAT VENOUS BLOOD GAS, ED
Acid-base deficit: 8 mmol/L — ABNORMAL HIGH (ref 0.0–2.0)
BICARBONATE: 18.5 mmol/L — AB (ref 20.0–28.0)
O2 SAT: 38 %
PCO2 VEN: 40.9 mmHg — AB (ref 44.0–60.0)
PO2 VEN: 25 mmHg — AB (ref 32.0–45.0)
TCO2: 20 mmol/L (ref 0–100)
pH, Ven: 7.264 (ref 7.250–7.430)

## 2016-08-11 LAB — PREGNANCY, URINE: PREG TEST UR: NEGATIVE

## 2016-08-11 MED ORDER — INSULIN REGULAR HUMAN 100 UNIT/ML IJ SOLN
10.0000 [IU] | Freq: Once | INTRAMUSCULAR | Status: AC
  Administered 2016-08-11: 10 [IU] via SUBCUTANEOUS
  Filled 2016-08-11: qty 1

## 2016-08-11 MED ORDER — DEXTROSE 5 % IV SOLN
1.0000 g | Freq: Once | INTRAVENOUS | Status: AC
  Administered 2016-08-11: 1 g via INTRAVENOUS
  Filled 2016-08-11: qty 10

## 2016-08-11 MED ORDER — POTASSIUM CHLORIDE 10 MEQ/100ML IV SOLN: 10.0000 meq | INTRAVENOUS | Status: DC

## 2016-08-11 MED ORDER — DEXTROSE-NACL 5-0.45 % IV SOLN
INTRAVENOUS | Status: DC
  Administered 2016-08-11: 12:00:00 via INTRAVENOUS

## 2016-08-11 MED ORDER — POTASSIUM CHLORIDE CRYS ER 20 MEQ PO TBCR
40.0000 meq | EXTENDED_RELEASE_TABLET | Freq: Once | ORAL | Status: AC
  Administered 2016-08-11: 40 meq via ORAL
  Filled 2016-08-11: qty 2

## 2016-08-11 MED ORDER — SODIUM CHLORIDE 0.9 % IV BOLUS (SEPSIS)
1000.0000 mL | Freq: Once | INTRAVENOUS | Status: AC
  Administered 2016-08-11: 1000 mL via INTRAVENOUS

## 2016-08-11 MED ORDER — INSULIN REGULAR HUMAN 100 UNIT/ML IJ SOLN
INTRAMUSCULAR | Status: DC
  Administered 2016-08-11: 0.4 [IU]/h via INTRAVENOUS
  Filled 2016-08-11 (×2): qty 2.5

## 2016-08-11 NOTE — ED Provider Notes (Signed)
[facility] Provider Note   CSN: [id number] Arrival date & time: 08/11/16  0909     History   Chief Complaint Chief Complaint  Patient presents with  . Leg Swelling  . Hyperglycemia    HPI Zoe Clay is a 41 y.o. female.   Hyperglycemia  Blood sugar level PTA:  High Severity:  Moderate Onset quality:  Gradual Duration:  2 days Timing:  Constant Progression:  Worsening Diabetes status:  Controlled with insulin Context: recent illness   Context: not change in medication   Relieved by:  None tried Ineffective treatments:  None tried Associated symptoms: no abdominal pain and no altered mental status     Past Medical History:  Diagnosis Date  . Bipolar 1 disorder ([place])   . Diabetes mellitus   . High cholesterol   . Hypertension   . Peripheral neuropathy   . Seizures ([place])    last seizure May 2016  . Thyroid disease     Patient Active Problem List   Diagnosis Date Noted  . DKA (diabetic ketoacidoses) ([place]) 06/20/2012  . Nausea with vomiting 06/20/2012  . 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 Seizure disorder ([place]) 06/20/2012  . Unspecified hypothyroidism 06/20/2012  . Hyponatremia 06/20/2012    Past Surgical History:  Procedure Laterality Date  . ANKLE SURGERY    . TUBAL LIGATION      OB History    No data available       Home Medications    Prior to Admission medications   Medication Sig Start Date End Date Taking? Authorizing Provider  busPIRone (BUSPAR) 5 MG tablet Take 5 mg by mouth 2 (two) times daily.   Yes Historical Provider, MD  DULoxetine (CYMBALTA) 60 MG capsule Take 60 mg by mouth daily.   Yes Historical Provider, MD  gabapentin (NEURONTIN) 600 MG tablet Take 1 tablet (600 mg total) by mouth 2 (two) times daily. 01/10/15  Yes Katheren Shams, DO  hydrOXYzine (ATARAX/VISTARIL) 25 MG tablet Take 1 tablet (25 mg total) by mouth every 6 (six) hours. 12/13/15  Yes Shawn C  Joy, PA-C  insulin aspart (NOVOLOG) 100 UNIT/ML injection Inject into the skin 3 (three) times daily before meals.   Yes Historical Provider, MD  insulin glargine (LANTUS) 100 UNIT/ML injection Inject 15 Units into the skin daily. Patient taking differently: Inject 20 Units into the skin daily.  06/22/12  Yes Belkys A Regalado, MD  levothyroxine (SYNTHROID, LEVOTHROID) 125 MCG tablet Take 125 mcg by mouth daily.     Yes Historical Provider, MD  lisinopril (PRINIVIL,ZESTRIL) 5 MG tablet Take 5 mg by mouth daily.   Yes Historical Provider, MD  metFORMIN (GLUCOPHAGE) 500 MG tablet Take by mouth 2 (two) times daily with a meal.   Yes Historical Provider, MD  phenytoin (DILANTIN) 100 MG ER capsule Take 200 mg by mouth 2 (two) times daily.    Yes Historical Provider, MD  risperiDONE (RISPERDAL) 2 MG tablet Take 2 mg by mouth 2 (two) times daily.   Yes Historical Provider, MD  simvastatin (ZOCOR) 20 MG tablet Take 20 mg by mouth every evening.   Yes Historical Provider, MD  traZODone (DESYREL) 100 MG tablet Take 100 mg by mouth at bedtime.    Historical Provider, MD    Family History Family History  Problem Relation Age of Onset  . Diabetes Father   . Cancer Maternal Grandfather     Social History Social History  Substance Use  Topics  . Smoking status: Current Every Day Smoker    Packs/day: 0.50    Types: Cigarettes  . Smokeless tobacco: Never Used  . Alcohol use No     Allergies   Iodine and Iohexol   Review of Systems Review of Systems  Gastrointestinal: Negative for abdominal pain.  All other systems reviewed and are negative.    Physical Exam Updated Vital Signs BP 103/66   Pulse 96   Temp 98.1 F (36.7 C) (Oral)   Resp 19   Ht 5\' 5"  (1.651 m)   Wt 157 lb (71.2 kg)   SpO2 100%   BMI 26.13 kg/m   Physical Exam  Constitutional: She is oriented to person, place, and time. She appears well-developed and well-nourished.  HENT:  Head: Normocephalic and atraumatic.    Eyes: Conjunctivae and EOM are normal.  Neck: Normal range of motion.  Cardiovascular: Regular rhythm.  Tachycardia present.   Pulmonary/Chest: Effort normal. No stridor. Tachypnea noted. No respiratory distress.  Abdominal: Soft. She exhibits no distension.  Musculoskeletal: Normal range of motion. She exhibits no edema or deformity.  Neurological: She is alert and oriented to person, place, and time.  Skin: Skin is warm and dry.  Nursing note and vitals reviewed.    ED Treatments / Results  Labs (all labs ordered are listed, but only abnormal results are displayed) Labs Reviewed  BASIC METABOLIC PANEL - Abnormal; Notable for the following:       Result Value   Potassium 3.3 (*)    Chloride 98 (*)    CO2 19 (*)    Glucose, Bld 320 (*)    Anion gap 19 (*)    All other components within normal limits  CBC - Abnormal; Notable for the following:    RBC 3.74 (*)    Hemoglobin 11.8 (*)    HCT 34.4 (*)    Platelets 402 (*)    All other components within normal limits  URINALYSIS, ROUTINE W REFLEX MICROSCOPIC - Abnormal; Notable for the following:    APPearance CLOUDY (*)    Glucose, UA >=500 (*)    Hgb urine dipstick TRACE (*)    Ketones, ur >80 (*)    All other components within normal limits  URINALYSIS, MICROSCOPIC (REFLEX) - Abnormal; Notable for the following:    Bacteria, UA MANY (*)    Squamous Epithelial / LPF 6-30 (*)    All other components within normal limits  CBG MONITORING, ED - Abnormal; Notable for the following:    Glucose-Capillary 423 (*)    All other components within normal limits  CBG MONITORING, ED - Abnormal; Notable for the following:    Glucose-Capillary 101 (*)    All other components within normal limits  I-STAT VENOUS BLOOD GAS, ED - Abnormal; Notable for the following:    pCO2, Ven 40.9 (*)    pO2, Ven 25.0 (*)    Bicarbonate 18.5 (*)    Acid-base deficit 8.0 (*)    All other components within normal limits  URINE CULTURE  PREGNANCY,  URINE  BLOOD GAS, VENOUS  BRAIN NATRIURETIC PEPTIDE    EKG  EKG Interpretation  Date/Time:  Monday August 11 2016 10:57:39 EDT Ventricular Rate:  93 PR Interval:    QRS Duration: 91 QT Interval:  378 QTC Calculation: 471 R Axis:   70 Text Interpretation:  Sinus rhythm Baseline wander in lead(s) V4 V6 aside from improved inferior TWI,  No significant change since last tracing Confirmed by [facility] MD,  Gila Lauf [phone]) on 08/11/2016 1:32:29 PM       Radiology No results found.  Procedures Procedures (including critical care time)  CRITICAL CARE Performed by: Merrily Pew Total critical care time: 38 minutes Critical care time was exclusive of separately billable procedures and treating other patients. Critical care was necessary to treat or prevent imminent or life-threatening deterioration. Critical care was time spent personally by me on the following activities: development of treatment plan with patient and/or surrogate as well as nursing, discussions with consultants, evaluation of patient's response to treatment, examination of patient, obtaining history from patient or surrogate, ordering and performing treatments and interventions, ordering and review of laboratory studies, ordering and review of radiographic studies, pulse oximetry and re-evaluation of patient's condition.   Medications Ordered in ED Medications  dextrose 5 %-0.45 % sodium chloride infusion ( Intravenous Transfusing/Transfer 08/11/16 1155)  insulin regular (NOVOLIN R,HUMULIN R) 250 Units in sodium chloride 0.9 % 250 mL (1 Units/mL) infusion ( Intravenous Transfusing/Transfer 08/11/16 1155)  potassium chloride 10 mEq in 100 mL IVPB (not administered)  sodium chloride 0.9 % bolus 1,000 mL (0 mLs Intravenous Stopped 08/11/16 1132)  insulin regular (NOVOLIN R,HUMULIN R) 250 units/2.22mL (100 units/mL) injection 10 Units (10 Units Subcutaneous Given 08/11/16 1006)  cefTRIAXone (ROCEPHIN) 1 g in dextrose 5 % 50 mL IVPB  (0 g Intravenous Stopped 08/11/16 1130)  potassium chloride SA (K-DUR,KLOR-CON) CR tablet 40 mEq (40 mEq Oral Given 08/11/16 1050)     Initial Impression / Assessment and Plan / ED Course  I have reviewed the triage vital signs and the nursing notes.  Pertinent labs & imaging results that were available during my care of the patient were reviewed by me and considered in my medical decision making (see chart for details).     Hyperglycemia vs early DKA likely related to non complian.   1030: Labs c/w DKA. Slightly low K, will replete and start insulin infusion/admission to hospital.  Also with likely uti. Will start antibiotics, urine culture.   Patient requesting admission to [facility] rather than [facility] system, discussed with hospitalist, Dr. Marily Lente who recommended I speak to Dr. Vara Guardian in ICU, who accepted patient in transfer.     Final Clinical Impressions(s) / ED Diagnoses   Final diagnoses:  Diabetic ketoacidosis without coma associated with type 1 diabetes mellitus (HCC)      Merrily Pew, MD 08/11/16 1332

## 2016-08-11 NOTE — ED Triage Notes (Signed)
MD at bedside -- pt reports bil LE swelling x3days. Pt reports increased CBGs this morning after missing a couple doses of insulin. Reports taking 10 units Novolog around 0730. Reports nausea. Denies v/d.

## 2016-08-11 NOTE — ED Notes (Signed)
MD notified CBG of 101 -- verbal order received to initiate insulin drip glucostabilizer with maintenance fluids.

## 2016-08-11 NOTE — ED Notes (Signed)
ED Provider at bedside. 

## 2016-08-12 LAB — URINE CULTURE

## 2016-08-21 ENCOUNTER — Emergency Department (HOSPITAL_BASED_OUTPATIENT_CLINIC_OR_DEPARTMENT_OTHER)
Admission: EM | Admit: 2016-08-21 | Discharge: 2016-08-21 | Disposition: A | Discharge status: Home / Self Care | Payer: Self-pay | Attending: Emergency Medicine | Admitting: Emergency Medicine

## 2016-08-21 ENCOUNTER — Encounter (HOSPITAL_BASED_OUTPATIENT_CLINIC_OR_DEPARTMENT_OTHER): Payer: Self-pay | Admitting: *Deleted

## 2016-08-21 DIAGNOSIS — F319 Bipolar disorder, unspecified: Secondary | ICD-10-CM | POA: Insufficient documentation

## 2016-08-21 DIAGNOSIS — Z794 Long term (current) use of insulin: Secondary | ICD-10-CM | POA: Insufficient documentation

## 2016-08-21 DIAGNOSIS — F17210 Nicotine dependence, cigarettes, uncomplicated: Secondary | ICD-10-CM | POA: Insufficient documentation

## 2016-08-21 DIAGNOSIS — E039 Hypothyroidism, unspecified: Secondary | ICD-10-CM | POA: Insufficient documentation

## 2016-08-21 DIAGNOSIS — E1010 Type 1 diabetes mellitus with ketoacidosis without coma: Secondary | ICD-10-CM | POA: Insufficient documentation

## 2016-08-21 DIAGNOSIS — F209 Schizophrenia, unspecified: Secondary | ICD-10-CM | POA: Insufficient documentation

## 2016-08-21 DIAGNOSIS — F419 Anxiety disorder, unspecified: Secondary | ICD-10-CM | POA: Insufficient documentation

## 2016-08-21 DIAGNOSIS — I10 Essential (primary) hypertension: Secondary | ICD-10-CM | POA: Insufficient documentation

## 2016-08-21 HISTORY — DX: Brief psychotic disorder: F23

## 2016-08-21 MED ORDER — RISPERIDONE 0.5 MG PO TABS
2.0000 mg | ORAL_TABLET | Freq: Once | ORAL | Status: AC
  Administered 2016-08-21: 2 mg via ORAL
  Filled 2016-08-21: qty 4

## 2016-08-21 MED ORDER — RISPERIDONE 2 MG PO TABS: 2.0000 mg | ORAL_TABLET | Freq: Two times a day (BID) | ORAL | 0 refills | Status: DC

## 2016-08-21 MED ORDER — PHENYTOIN SODIUM EXTENDED 200 MG PO CAPS: 200.0000 mg | ORAL_CAPSULE | Freq: Two times a day (BID) | ORAL | 0 refills | Status: DC

## 2016-08-21 MED ORDER — TRAZODONE HCL 100 MG PO TABS: 100.0000 mg | ORAL_TABLET | Freq: Every day | ORAL | 0 refills | Status: DC

## 2016-08-21 MED ORDER — PHENYTOIN SODIUM EXTENDED 100 MG PO CAPS
200.0000 mg | ORAL_CAPSULE | Freq: Once | ORAL | Status: AC
  Administered 2016-08-21: 200 mg via ORAL
  Filled 2016-08-21: qty 2

## 2016-08-21 MED ORDER — HYDROXYZINE HCL 25 MG PO TABS
25.0000 mg | ORAL_TABLET | Freq: Once | ORAL | Status: AC
  Administered 2016-08-21: 25 mg via ORAL
  Filled 2016-08-21: qty 1

## 2016-08-21 MED ORDER — HYDROXYZINE HCL 25 MG PO TABS: 25.0000 mg | ORAL_TABLET | Freq: Three times a day (TID) | ORAL | 0 refills | Status: DC | PRN

## 2016-08-21 NOTE — ED Triage Notes (Signed)
Pt c/o increased stress and anxiety, insomnia , requesting something for "my nerves" x 4 days

## 2016-08-21 NOTE — ED Provider Notes (Signed)
[facility] DEPT [facility] Provider Note   CSN: [id number] Arrival date & time: 08/21/16  2139  By signing my name below, I, Charolotte Eke, attest that this documentation has been prepared under the direction and in the presence of Luiz Blare, DO. Electronically Signed: Charolotte Eke, Scribe. 08/21/16. 10:13 PM.    History   Chief Complaint Chief Complaint  Patient presents with  . Anxiety    HPI Zoe Clay is a 41 y.o. female with h/o bipolar 1, schizophrenia, DM, HTN who presents to the Emergency Department complaining of increasing, moderate-severe anxiety and stress induced by trying to get out of a relationship that worsened today. Pt is in a depressed state constantly. Pt does not sleep a lot. She slept 4 hours yesterday. Pt's daughter is in room. Pt states that her partner is not in the picture at the moment, but that she is worried about him contacting her. Pt has been sent to psychiatry ward due to behavior before. She is concerned that if she does not get something for her mental health she will end up doing something (no discrete idea or plan). Pt goes to [facility] for mental health. Pt is medicated, but has ran out of her medications. Pt uses risperidone, Buspar. Pt has used lorazepam before. Pt has been off of her medications for 2 weeks due to not having insurance. Pt has recently gotten insurance and is planning on following up with her mental health provider. Pt has h/o harming herself. Pt denies HI, SI.  The history is provided by the patient and a relative. No language interpreter was used.    Past Medical History:  Diagnosis Date  . Bipolar 1 disorder ([place])   . Diabetes mellitus   . High cholesterol   . Hypertension   . Peripheral neuropathy   . Schizophrenia, acute   . Seizures ([place])    last seizure May 2016  . Thyroid disease     Patient Active Problem List   Diagnosis Date Noted  . DKA (diabetic ketoacidoses) ([place]) 06/20/2012  . Nausea with vomiting 06/20/2012    . 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 Seizure disorder ([place]) 06/20/2012  . Unspecified hypothyroidism 06/20/2012  . Hyponatremia 06/20/2012    Past Surgical History:  Procedure Laterality Date  . ANKLE SURGERY    . TUBAL LIGATION      OB History    No data available      Home Medications    Prior to Admission medications   Medication Sig Start Date End Date Taking? Authorizing Provider  risperiDONE (RISPERDAL) 2 MG tablet Take 2 mg by mouth 2 (two) times daily.   Yes [provider]  traZODone (DESYREL) 100 MG tablet Take 100 mg by mouth at bedtime.   Yes [provider]  busPIRone (BUSPAR) 5 MG tablet Take 5 mg by mouth 2 (two) times daily.    [provider]  DULoxetine (CYMBALTA) 60 MG capsule Take 60 mg by mouth daily.    [provider]  gabapentin (NEURONTIN) 600 MG tablet Take 1 tablet (600 mg total) by mouth 2 (two) times daily. 01/10/15   Katheren Shams, DO  hydrOXYzine (ATARAX/VISTARIL) 25 MG tablet Take 1 tablet (25 mg total) by mouth every 6 (six) hours. 12/13/15   Joy, Shawn C, PA-C  insulin aspart (NOVOLOG) 100 UNIT/ML injection Inject into the skin 3 (three) times daily before meals.    [provider]  insulin glargine (LANTUS) 100  UNIT/ML injection Inject 15 Units into the skin daily. Patient taking differently: Inject 20 Units into the skin daily.  06/22/12   Regalado, Belkys A, MD  levothyroxine (SYNTHROID, LEVOTHROID) 125 MCG tablet Take 125 mcg by mouth daily.      [provider]  lisinopril (PRINIVIL,ZESTRIL) 5 MG tablet Take 5 mg by mouth daily.    [provider]  phenytoin (DILANTIN) 100 MG ER capsule Take 200 mg by mouth 2 (two) times daily.     [provider]  simvastatin (ZOCOR) 20 MG tablet Take 20 mg by mouth every evening.    [provider]    Family History Family History  Problem Relation Age of Onset  . Diabetes  Father   . Cancer Maternal Grandfather     Social History Social History  Substance Use Topics  . Smoking status: Current Every Day Smoker    Packs/day: 0.50    Types: Cigarettes  . Smokeless tobacco: Never Used  . Alcohol use No    Allergies   Iodine and Iohexol   Review of Systems Review of Systems  Constitutional: Negative for fever.  Psychiatric/Behavioral: Positive for agitation and sleep disturbance. Negative for self-injury and suicidal ideas. The patient is nervous/anxious.   All systems reviewed and are negative for acute change except as noted in the HPI.  Physical Exam Updated Vital Signs BP (!) 142/93 (BP Location: Left Arm)   Pulse 98   Temp 98.1 F (36.7 C) (Oral)   Resp (!) 21   Ht 5\' 5"  (1.651 m)   Wt 150 lb (68 kg)   SpO2 100%   BMI 24.96 kg/m   Physical Exam  Constitutional: She is oriented to person, place, and time. She appears well-developed and well-nourished.  HENT:  Head: Normocephalic and atraumatic.  Eyes: EOM are normal.  Neck: Normal range of motion.  Cardiovascular: Normal rate and regular rhythm.   Pulmonary/Chest: Effort normal and breath sounds normal.  Neurological: She is alert and oriented to person, place, and time.  Skin: Skin is warm and dry.  Psychiatric: Her speech is normal and behavior is normal. Thought content normal. Her mood appears anxious. She expresses no homicidal and no suicidal ideation.  Nursing note and vitals reviewed.   ED Treatments / Results   DIAGNOSTIC STUDIES: Oxygen Saturation is 100% on room air, normal by my interpretation.    COORDINATION OF CARE: 10:05 PM Discussed treatment plan with pt at bedside and pt agreed to plan, which includes anti-anxiety medications.  Labs (all labs ordered are listed, but only abnormal results are displayed) Labs Reviewed - No data to display  EKG  EKG Interpretation None       Radiology No results found.  Procedures Procedures (including critical  care time)  Medications Ordered in ED Medications - No data to display   Initial Impression / Assessment and Plan / ED Course  I have reviewed the triage vital signs and the nursing notes.  Pertinent labs & imaging results that were available during my care of the patient were reviewed by me and considered in my medical decision making (see chart for details).  Patient presents to ED with increased anxiety. Patient has significant history of mental health illness including bipolar, schizophrenia, anxiety, self-harm. Patient's mood and affect are appropriate. She does not appear psychotic. Patient currently not on medications due to being out for the last 2 weeks. Patient given some of her medications here in the ED. Sent home with prescription for  10 days' worth of her antipsychotics and anxiety medication. Patient to follow up with her mental health provider for further treatment. Patient stable for discharge without any SI, HI.  Final Clinical Impressions(s) / ED Diagnoses   Final diagnoses:  Anxiety    New Prescriptions New Prescriptions   No medications on file   I personally performed the services described in this documentation, which was scribed in my presence. The recorded information has been reviewed and is accurate.     Katheren Shams, DO 08/21/16 2249    Blanchie Dessert, MD 08/23/16 0010

## 2016-08-21 NOTE — Discharge Instructions (Signed)
Glad you came to be evaluated!  Please follow-up with your mental health provider  Some of your home medications given for 10 days to help with symptoms until you can follow-up

## 2016-09-03 ENCOUNTER — Encounter (HOSPITAL_BASED_OUTPATIENT_CLINIC_OR_DEPARTMENT_OTHER): Payer: Self-pay | Admitting: *Deleted

## 2016-09-03 ENCOUNTER — Emergency Department (HOSPITAL_BASED_OUTPATIENT_CLINIC_OR_DEPARTMENT_OTHER)
Admission: EM | Admit: 2016-09-03 | Discharge: 2016-09-03 | Disposition: A | Discharge status: Home / Self Care | Payer: Self-pay | Attending: Physician Assistant | Admitting: Physician Assistant

## 2016-09-03 DIAGNOSIS — E119 Type 2 diabetes mellitus without complications: Secondary | ICD-10-CM | POA: Insufficient documentation

## 2016-09-03 DIAGNOSIS — K0889 Other specified disorders of teeth and supporting structures: Secondary | ICD-10-CM | POA: Insufficient documentation

## 2016-09-03 DIAGNOSIS — I10 Essential (primary) hypertension: Secondary | ICD-10-CM | POA: Insufficient documentation

## 2016-09-03 DIAGNOSIS — Z7984 Long term (current) use of oral hypoglycemic drugs: Secondary | ICD-10-CM | POA: Insufficient documentation

## 2016-09-03 DIAGNOSIS — Z79899 Other long term (current) drug therapy: Secondary | ICD-10-CM | POA: Insufficient documentation

## 2016-09-03 DIAGNOSIS — F17210 Nicotine dependence, cigarettes, uncomplicated: Secondary | ICD-10-CM | POA: Insufficient documentation

## 2016-09-03 MED ORDER — AMOXICILLIN 500 MG PO CAPS: 500.0000 mg | ORAL_CAPSULE | Freq: Three times a day (TID) | ORAL | 0 refills | Status: DC

## 2016-09-03 MED ORDER — ACETAMINOPHEN 500 MG PO TABS: 1000.0000 mg | ORAL_TABLET | Freq: Once | ORAL | Status: DC

## 2016-09-03 MED ORDER — IBUPROFEN 800 MG PO TABS
800.0000 mg | ORAL_TABLET | Freq: Once | ORAL | Status: AC
  Administered 2016-09-03: 800 mg via ORAL
  Filled 2016-09-03: qty 1

## 2016-09-03 MED ORDER — HYDROCODONE-ACETAMINOPHEN 5-325 MG PO TABS: 2.0000 | ORAL_TABLET | Freq: Once | ORAL | Status: DC

## 2016-09-03 MED ORDER — IBUPROFEN 800 MG PO TABS: 800.0000 mg | ORAL_TABLET | Freq: Once | ORAL | Status: DC

## 2016-09-03 NOTE — ED Provider Notes (Signed)
[facility] Provider Note   CSN: [id number] Arrival date & time: 09/03/16  1728  By signing my name below, I, Collene Leyden, attest that this documentation has been prepared under the direction and in the presence of Providence Lanius, PA-C. Electronically Signed: Collene Leyden, Scribe. 09/03/16. 6:43 PM.  History   Chief Complaint Chief Complaint  Patient presents with  . Dental Pain   HPI Comments: Zoe Clay is a 41 y.o. female with a history of DM, HLD, HTN, and bipolar disorder, who presents to the Emergency Department complaining of constant bilateral lower dental pain that began two days ago. Patient states she has had gradually worsening pain for the past two days. No injury or trauma. Patient reports taking tylenol with no relief. Patinet reports applying warm compresses with some relief. Patient states her pain is worse with eating but she has been able to tolerate PO and secretions.  Patient is a current tobacco user of 4 cigerettes a day. Patient does not have a dentist. Patient denies any facial swelling, trouble swallowing, sore throat, fever, chills, nausea, or vomiting.   The history is provided by the patient. No language interpreter was used.    Past Medical History:  Diagnosis Date  . Bipolar 1 disorder ([place])   . Diabetes mellitus   . High cholesterol   . Hypertension   . Peripheral neuropathy   . Schizophrenia, acute   . Seizures ([place])    last seizure May 2016  . Thyroid disease     Patient Active Problem List   Diagnosis Date Noted  . DKA (diabetic ketoacidoses) ([place]) 06/20/2012  . Nausea with vomiting 06/20/2012  . 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 Seizure disorder ([place]) 06/20/2012  . Unspecified hypothyroidism 06/20/2012  . Hyponatremia 06/20/2012    Past Surgical History:  Procedure Laterality Date  . ANKLE SURGERY    . TUBAL LIGATION      OB History    No data available         Home Medications    Prior to Admission medications   Medication Sig Start Date End Date Taking? Authorizing Provider  amoxicillin (AMOXIL) 500 MG capsule Take 1 capsule (500 mg total) by mouth 3 (three) times daily. 09/03/16   Volanda Napoleon, PA-C  busPIRone (BUSPAR) 5 MG tablet Take 5 mg by mouth 2 (two) times daily.    [provider]  DULoxetine (CYMBALTA) 60 MG capsule Take 60 mg by mouth daily.    [provider]  gabapentin (NEURONTIN) 600 MG tablet Take 1 tablet (600 mg total) by mouth 2 (two) times daily. 01/10/15   Katheren Shams, DO  hydrOXYzine (ATARAX/VISTARIL) 25 MG tablet Take 1 tablet (25 mg total) by mouth every 6 (six) hours. 12/13/15   Joy, Shawn C, PA-C  hydrOXYzine (ATARAX/VISTARIL) 25 MG tablet Take 1 tablet (25 mg total) by mouth every 8 (eight) hours as needed for anxiety. 08/21/16   Katheren Shams, DO  insulin aspart (NOVOLOG) 100 UNIT/ML injection Inject into the skin 3 (three) times daily before meals.    [provider]  insulin glargine (LANTUS) 100 UNIT/ML injection Inject 15 Units into the skin daily. Patient taking differently: Inject 20 Units into the skin daily.  06/22/12   Regalado, Belkys A, MD  levothyroxine (SYNTHROID, LEVOTHROID) 125 MCG tablet Take 125 mcg by mouth daily.      [provider]  lisinopril (PRINIVIL,ZESTRIL) 5 MG tablet Take 5 mg  by mouth daily.    [provider]  phenytoin (DILANTIN) 100 MG ER capsule Take 200 mg by mouth 2 (two) times daily.     [provider]  phenytoin (DILANTIN) 200 MG ER capsule Take 1 capsule (200 mg total) by mouth 2 (two) times daily. 08/21/16 08/31/16  Katheren Shams, DO  risperiDONE (RISPERDAL) 2 MG tablet Take 2 mg by mouth 2 (two) times daily.    [provider]  risperiDONE (RISPERDAL) 2 MG tablet Take 1 tablet (2 mg total) by mouth 2 (two) times daily. 08/21/16 08/31/16  Katheren Shams, DO  simvastatin (ZOCOR) 20 MG tablet Take 20 mg by mouth  every evening.    [provider]  traZODone (DESYREL) 100 MG tablet Take 100 mg by mouth at bedtime.    [provider]  traZODone (DESYREL) 100 MG tablet Take 1 tablet (100 mg total) by mouth at bedtime. 08/21/16   Katheren Shams, DO    Family History Family History  Problem Relation Age of Onset  . Diabetes Father   . Cancer Maternal Grandfather     Social History Social History  Substance Use Topics  . Smoking status: Current Every Day Smoker    Packs/day: 0.50    Types: Cigarettes  . Smokeless tobacco: Never Used  . Alcohol use No     Allergies   Iodine and Iohexol   Review of Systems Review of Systems  Constitutional: Negative for chills and fever.  HENT: Positive for dental problem. Negative for sore throat and trouble swallowing.   Respiratory: Negative for shortness of breath.   All other systems reviewed and are negative.    Physical Exam Updated Vital Signs BP 121/83 (BP Location: Right Arm)   Pulse 94   Temp 98.4 F (36.9 C) (Oral)   Resp 16   Ht 5\' 5"  (1.651 m)   Wt 70.3 kg (155 lb)   SpO2 100%   BMI 25.79 kg/m   Physical Exam  Constitutional: She appears well-developed and well-nourished.  Sitting comfortably on examination table  HENT:  Head: Normocephalic and atraumatic.  Right Ear: External ear normal.  Left Ear: External ear normal.  Mouth/Throat: Uvula is midline, oropharynx is clear and moist and mucous membranes are normal. Abnormal dentition. Dental caries present. No oropharyngeal exudate.  Poor dentition. Multiple evidence of dental cane dental erosion. Multiple missing teeth. Mild gingival swelling bilaterally. No focal area of gingival swelling or erythema. No evidence of abscess. She has 2 cracked teeth with obvious decay on the left side. She has 1 cracked molar with decay on the right side. No surrounding area of fluctuance or abscess seen. Elevation/depression and lateral movement amenable intact without  difficulty. No trismus or facial swelling.  Eyes: Conjunctivae and EOM are normal. Right eye exhibits no discharge. Left eye exhibits no discharge. No scleral icterus.  Pulmonary/Chest: Effort normal.  No evidence of respiratory distress. Able to speak in full sentences without difficulty.  Musculoskeletal: She exhibits no deformity.  Neurological: She is alert.  Skin: Skin is warm and dry.  Psychiatric: She has a normal mood and affect. Her speech is normal and behavior is normal.  Nursing note and vitals reviewed.    ED Treatments / Results  DIAGNOSTIC STUDIES: Oxygen Saturation is 100% on RA, normal by my interpretation.    COORDINATION OF CARE: 6:42 PM Discussed treatment plan with pt at bedside and pt agreed to plan, which includes tylenol, ibuprofen, and an antibiotic.   Labs (  all labs ordered are listed, but only abnormal results are displayed) Labs Reviewed - No data to display  EKG  EKG Interpretation None       Radiology No results found.  Procedures Procedures (including critical care time)  Medications Ordered in ED Medications  ibuprofen (ADVIL,MOTRIN) tablet 800 mg (800 mg Oral Given 09/03/16 1913)     Initial Impression / Assessment and Plan / ED Course  I have reviewed the triage vital signs and the nursing notes.  Pertinent labs & imaging results that were available during my care of the patient were reviewed by me and considered in my medical decision making (see chart for details).     41 year old female who presents with 2 days of worsening dental pain. Patient is afebrile, non-toxic appearing, sitting comfortably on examination table. Patient has poor dentition at baseline. She is not followed by a dentist. History/physical exam are not concerning for dental abscess or deep neck space infection. Likely result of multiple dental erosion of dental decay. Discussed with patient that we do not treat dental pain with outpatient narcotics. Will provide  her with one dose in the emergency department for pain relief. Instructed her that she needs to follow up with a dentist for further evaluation of multiple dental issues. Will provide patient with a list of dental resources that she can follow up with on an outpatient basis.   Patient does not have a ride here and will be driving herself home. Pain medication cancel. Instructed patient that she can continue alternate Tylenol and ibuprofen as directed for pain. Will provide patient with a short course of amoxicillin for an about coverage. Discussed plan the patient. Instructed her follow up with outpatient resources. Strict return precautions discussed. Patient asked versus understanding and agreement to plan.  Final Clinical Impressions(s) / ED Diagnoses   Final diagnoses:  Pain, dental    New Prescriptions Discharge Medication List as of 09/03/2016  7:07 PM    START taking these medications   Details  amoxicillin (AMOXIL) 500 MG capsule Take 1 capsule (500 mg total) by mouth 3 (three) times daily., Starting Wed 09/03/2016, Print       I personally performed the services described in this documentation, which was scribed in my presence. The recorded information has been reviewed and is accurate.    Volanda Napoleon, PA-C 09/03/16 1958    Macarthur Critchley, MD 09/03/16 2255

## 2016-09-03 NOTE — Discharge Instructions (Signed)
Continue taking Tylenol and ibuprofen as needed for pain.  Take antibiotics as directed.  Use warm compresses to help with the pain.  Follow-up with one of the referred dental resources listed in the paperwork.  Return the emergency Department for any worsening pain, fever, difficulty swallowing, difficulty breathing or any other worsening or concerning symptoms.   If you do not have a primary care doctor you see regularly, please you the list below. Please call them to arrange for follow-up.    No Primary Care Doctor Call [facility] Other agencies that provide inexpensive medical care    [place]  [phone]    [facility]  [place]  [phone]    [facility]  [phone]    [facility]  [phone]    [facility]  [phone]

## 2016-10-07 ENCOUNTER — Encounter (HOSPITAL_BASED_OUTPATIENT_CLINIC_OR_DEPARTMENT_OTHER): Payer: Self-pay | Admitting: *Deleted

## 2016-10-07 ENCOUNTER — Emergency Department (HOSPITAL_BASED_OUTPATIENT_CLINIC_OR_DEPARTMENT_OTHER)
Admission: EM | Admit: 2016-10-07 | Discharge: 2016-10-07 | Disposition: A | Discharge status: Home / Self Care | Payer: Self-pay | Attending: Emergency Medicine | Admitting: Emergency Medicine

## 2016-10-07 DIAGNOSIS — Z79899 Other long term (current) drug therapy: Secondary | ICD-10-CM | POA: Insufficient documentation

## 2016-10-07 DIAGNOSIS — I10 Essential (primary) hypertension: Secondary | ICD-10-CM | POA: Insufficient documentation

## 2016-10-07 DIAGNOSIS — E039 Hypothyroidism, unspecified: Secondary | ICD-10-CM | POA: Insufficient documentation

## 2016-10-07 DIAGNOSIS — F17210 Nicotine dependence, cigarettes, uncomplicated: Secondary | ICD-10-CM | POA: Insufficient documentation

## 2016-10-07 DIAGNOSIS — R739 Hyperglycemia, unspecified: Secondary | ICD-10-CM

## 2016-10-07 DIAGNOSIS — R569 Unspecified convulsions: Secondary | ICD-10-CM | POA: Insufficient documentation

## 2016-10-07 DIAGNOSIS — E119 Type 2 diabetes mellitus without complications: Secondary | ICD-10-CM | POA: Insufficient documentation

## 2016-10-07 DIAGNOSIS — Z794 Long term (current) use of insulin: Secondary | ICD-10-CM | POA: Insufficient documentation

## 2016-10-07 LAB — CBC WITH DIFFERENTIAL/PLATELET
Basophils Absolute: 0 10*3/uL (ref 0.0–0.1)
Basophils Relative: 0 %
Eosinophils Absolute: 0 10*3/uL (ref 0.0–0.7)
Eosinophils Relative: 1 %
HEMATOCRIT: 34 % — AB (ref 36.0–46.0)
Hemoglobin: 11.7 g/dL — ABNORMAL LOW (ref 12.0–15.0)
Lymphocytes Relative: 50 %
Lymphs Abs: 2.5 10*3/uL (ref 0.7–4.0)
MCH: 31.2 pg (ref 26.0–34.0)
MCHC: 34.4 g/dL (ref 30.0–36.0)
MCV: 90.7 fL (ref 78.0–100.0)
MONO ABS: 0.3 10*3/uL (ref 0.1–1.0)
Monocytes Relative: 7 %
NEUTROS ABS: 2.1 10*3/uL (ref 1.7–7.7)
NEUTROS PCT: 42 %
Platelets: 317 10*3/uL (ref 150–400)
RBC: 3.75 MIL/uL — ABNORMAL LOW (ref 3.87–5.11)
RDW: 14.2 % (ref 11.5–15.5)
WBC: 4.9 10*3/uL (ref 4.0–10.5)

## 2016-10-07 LAB — COMPREHENSIVE METABOLIC PANEL
ALK PHOS: 114 U/L (ref 38–126)
ALT: 18 U/L (ref 14–54)
ANION GAP: 8 (ref 5–15)
AST: 31 U/L (ref 15–41)
Albumin: 3.8 g/dL (ref 3.5–5.0)
BILIRUBIN TOTAL: 0.3 mg/dL (ref 0.3–1.2)
BUN: 16 mg/dL (ref 6–20)
CALCIUM: 9.2 mg/dL (ref 8.9–10.3)
CO2: 27 mmol/L (ref 22–32)
Chloride: 98 mmol/L — ABNORMAL LOW (ref 101–111)
Creatinine, Ser: 1.1 mg/dL — ABNORMAL HIGH (ref 0.44–1.00)
GLUCOSE: 547 mg/dL — AB (ref 65–99)
Potassium: 4.6 mmol/L (ref 3.5–5.1)
Sodium: 133 mmol/L — ABNORMAL LOW (ref 135–145)
TOTAL PROTEIN: 6.9 g/dL (ref 6.5–8.1)

## 2016-10-07 LAB — URINALYSIS, ROUTINE W REFLEX MICROSCOPIC
BILIRUBIN URINE: NEGATIVE
Glucose, UA: >=500 mg/dL — AB
KETONES UR: NEGATIVE mg/dL
Leukocytes, UA: NEGATIVE
NITRITE: NEGATIVE
PROTEIN: NEGATIVE mg/dL
SPECIFIC GRAVITY, URINE: 1.03 (ref 1.005–1.030)
pH: 6.5 (ref 5.0–8.0)

## 2016-10-07 LAB — URINALYSIS, MICROSCOPIC (REFLEX)

## 2016-10-07 LAB — PREGNANCY, URINE: Preg Test, Ur: NEGATIVE

## 2016-10-07 LAB — MAGNESIUM: Magnesium: 1.9 mg/dL (ref 1.7–2.4)

## 2016-10-07 LAB — PHENYTOIN LEVEL, TOTAL

## 2016-10-07 LAB — CBG MONITORING, ED: Glucose-Capillary: 518 mg/dL (ref 65–99)

## 2016-10-07 MED ORDER — SODIUM CHLORIDE 0.9 % IV BOLUS (SEPSIS)
1000.0000 mL | Freq: Once | INTRAVENOUS | Status: AC
  Administered 2016-10-07: 1000 mL via INTRAVENOUS

## 2016-10-07 MED ORDER — PHENYTOIN SODIUM EXTENDED 100 MG PO CAPS
300.0000 mg | ORAL_CAPSULE | Freq: Once | ORAL | Status: AC
  Administered 2016-10-07: 300 mg via ORAL
  Filled 2016-10-07: qty 3

## 2016-10-07 NOTE — Discharge Instructions (Signed)
Take your medications as directed.  Do not drive until cleared by your doctor.

## 2016-10-07 NOTE — ED Notes (Signed)
Pt verbalizes understanding of d/c instructions and denies any further needs at this time. 

## 2016-10-07 NOTE — ED Triage Notes (Signed)
Pt reports seizures x 2  Today. Pt  C/o increased stress

## 2016-10-07 NOTE — ED Provider Notes (Signed)
[facility] Provider Note   CSN: [id number] Arrival date & time: 10/07/16  1854  By signing my name below, I, Levester Fresh, attest that this documentation has been prepared under the direction and in the presence of No att. providers found . Electronically Signed: Levester Fresh, Scribe. 10/07/2016. 11:34 PM.  History   Chief Complaint Chief Complaint  Patient presents with  . Seizures   HPI Comments Zoe Clay is a 41 y.o. female with a PMHx significant for bipolar disorder, DM, hyperlipidemia, HTN, schizophrenia and seizures, who presents to the Emergency Department with complaints of two witnessed seizures today.  Her daughter, who is at bedside, states that pt started shaking and was unable to speak, which is the typical presentation of her sx.  She reports that one lasted 10 minutes, and the other lasted 5.  No head impact or fall.  Pt feels as if they were triggered by increased stress and decreased sleep due to work.  Here, pt's only complaint is a headache, which is her typical of her post-ictal state, although it has lasted longer than usual. Reports medication compliance to her antiepileptics.  No dysuria, vomiting or abdominal pain.  No new medications or changes to medicine.  Pt denies experiencing any other acute sx.  The history is provided by the patient and medical records. No language interpreter was used.    Past Medical History:  Diagnosis Date  . Bipolar 1 disorder ([place])   . Diabetes mellitus   . High cholesterol   . Hypertension   . Peripheral neuropathy   . Schizophrenia, acute   . Seizures ([place])    last seizure May 2016  . Thyroid disease     Patient Active Problem List   Diagnosis Date Noted  . DKA (diabetic ketoacidoses) ([place]) 06/20/2012  . Nausea with vomiting 06/20/2012  . 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 Seizure disorder ([place]) 06/20/2012  . Unspecified hypothyroidism  06/20/2012  . Hyponatremia 06/20/2012    Past Surgical History:  Procedure Laterality Date  . ANKLE SURGERY    . TUBAL LIGATION      OB History    No data available       Home Medications    Prior to Admission medications   Medication Sig Start Date End Date Taking? Authorizing Provider  amoxicillin (AMOXIL) 500 MG capsule Take 1 capsule (500 mg total) by mouth 3 (three) times daily. 09/03/16   Volanda Napoleon, PA-C  busPIRone (BUSPAR) 5 MG tablet Take 5 mg by mouth 2 (two) times daily.    [provider]  DULoxetine (CYMBALTA) 60 MG capsule Take 60 mg by mouth daily.    [provider]  gabapentin (NEURONTIN) 600 MG tablet Take 1 tablet (600 mg total) by mouth 2 (two) times daily. 01/10/15   Katheren Shams, DO  hydrOXYzine (ATARAX/VISTARIL) 25 MG tablet Take 1 tablet (25 mg total) by mouth every 6 (six) hours. 12/13/15   Joy, Shawn C, PA-C  hydrOXYzine (ATARAX/VISTARIL) 25 MG tablet Take 1 tablet (25 mg total) by mouth every 8 (eight) hours as needed for anxiety. 08/21/16   Katheren Shams, DO  insulin aspart (NOVOLOG) 100 UNIT/ML injection Inject into the skin 3 (three) times daily before meals.    [provider]  insulin glargine (LANTUS) 100 UNIT/ML injection Inject 15 Units into the skin daily. Patient taking differently: Inject 20 Units into the skin daily.  06/22/12   Regalado,  Belkys A, MD  levothyroxine (SYNTHROID, LEVOTHROID) 125 MCG tablet Take 125 mcg by mouth daily.      [provider]  lisinopril (PRINIVIL,ZESTRIL) 5 MG tablet Take 5 mg by mouth daily.    [provider]  phenytoin (DILANTIN) 100 MG ER capsule Take 200 mg by mouth 2 (two) times daily.     [provider]  phenytoin (DILANTIN) 200 MG ER capsule Take 1 capsule (200 mg total) by mouth 2 (two) times daily. 08/21/16 08/31/16  Katheren Shams, DO  risperiDONE (RISPERDAL) 2 MG tablet Take 2 mg by mouth 2 (two) times daily.    [provider]    risperiDONE (RISPERDAL) 2 MG tablet Take 1 tablet (2 mg total) by mouth 2 (two) times daily. 08/21/16 08/31/16  Katheren Shams, DO  simvastatin (ZOCOR) 20 MG tablet Take 20 mg by mouth every evening.    [provider]  traZODone (DESYREL) 100 MG tablet Take 100 mg by mouth at bedtime.    [provider]  traZODone (DESYREL) 100 MG tablet Take 1 tablet (100 mg total) by mouth at bedtime. 08/21/16   Katheren Shams, DO    Family History Family History  Problem Relation Age of Onset  . Diabetes Father   . Cancer Maternal Grandfather     Social History Social History  Substance Use Topics  . Smoking status: Current Every Day Smoker    Packs/day: 0.50    Types: Cigarettes  . Smokeless tobacco: Never Used  . Alcohol use No     Allergies   Iodine and Iohexol   Review of Systems Review of Systems  Constitutional: Negative for chills and fever.  Eyes: Negative for visual disturbance.  Respiratory: Negative for shortness of breath.   Cardiovascular: Negative for chest pain and leg swelling.  Gastrointestinal: Negative for abdominal pain, diarrhea, nausea and vomiting.  Genitourinary: Negative for dysuria.  Musculoskeletal: Negative for arthralgias, back pain and neck pain.  Skin: Negative for pallor.  Neurological: Positive for seizures and headaches. Negative for weakness and numbness.  All other systems reviewed and are negative.  Physical Exam Updated Vital Signs BP (!) 132/99   Pulse 94   Temp 98.3 F (36.8 C)   Resp 18   Ht 5\' 5"  (1.651 m)   Wt 63.5 kg (140 lb)   SpO2 99%   BMI 23.30 kg/m   Physical Exam  Constitutional: She is oriented to person, place, and time. She appears well-developed and well-nourished.  HENT:  Head: Normocephalic and atraumatic.  Eyes: EOM are normal.  Cardiovascular: Normal rate and regular rhythm.   No murmur heard. Pulmonary/Chest: Effort normal and breath sounds normal. No respiratory distress.  Abdominal: Soft.  There is no tenderness. There is no rebound and no guarding.  Musculoskeletal: She exhibits no edema or tenderness.  Neurological: She is alert and oriented to person, place, and time.  5/5 strength in all extremities.  No facial asymmetry.  Skin: Skin is warm and dry.  Psychiatric: She has a normal mood and affect. Her behavior is normal.  Nursing note and vitals reviewed.  ED Treatments / Results  DIAGNOSTIC STUDIES: Oxygen Saturation is 100% on room air, normal by my interpretation.    COORDINATION OF CARE: 7:59 PM Discussed treatment plan with pt at bedside and pt agreed to plan.  Labs (all labs ordered are listed, but only abnormal results are displayed) Labs Reviewed  COMPREHENSIVE METABOLIC PANEL - Abnormal; Notable for the following:  Result Value   Sodium 133 (*)    Chloride 98 (*)    Glucose, Bld 547 (*)    Creatinine, Ser 1.10 (*)    All other components within normal limits  CBC WITH DIFFERENTIAL/PLATELET - Abnormal; Notable for the following:    RBC 3.75 (*)    Hemoglobin 11.7 (*)    HCT 34.0 (*)    All other components within normal limits  URINALYSIS, ROUTINE W REFLEX MICROSCOPIC - Abnormal; Notable for the following:    Color, Urine GREEN (*)    Glucose, UA >=500 (*)    Hgb urine dipstick TRACE (*)    All other components within normal limits  PHENYTOIN LEVEL, TOTAL - Abnormal; Notable for the following:    Phenytoin Lvl <2.5 (*)    All other components within normal limits  URINALYSIS, MICROSCOPIC (REFLEX) - Abnormal; Notable for the following:    Bacteria, UA FEW (*)    Squamous Epithelial / LPF 6-30 (*)    All other components within normal limits  CBG MONITORING, ED - Abnormal; Notable for the following:    Glucose-Capillary 518 (*)    All other components within normal limits  MAGNESIUM  PREGNANCY, URINE    EKG  EKG Interpretation None       Radiology No results found.  Procedures Procedures (including critical care  time)  Medications Ordered in ED Medications  sodium chloride 0.9 % bolus 1,000 mL (0 mLs Intravenous Stopped 10/07/16 2127)  phenytoin (DILANTIN) ER capsule 300 mg (300 mg Oral Given 10/07/16 2126)     Initial Impression / Assessment and Plan / ED Course  I have reviewed the triage vital signs and the nursing notes.  Pertinent labs & imaging results that were available during my care of the patient were reviewed by me and considered in my medical decision making (see chart for details).     Patient here for evaluation of seizure 2 today. She is currently back at her neurologic baseline. She endorses increased stress lately but states she is compliant with her medications. Dilantin level was noted to be undetectable and her sugar is noted to be in the 500s. No evidence of DKA or significant electrolyte abnormality. Counseled patient on home care for seizure with continuing her medications as directed. Discussed seizure precautions. Discussed close outpatient follow-up and return precautions.  Presentation is not consistent with subarachnoid hemorrhage, CVA, meningitis.  I personally performed the services described in this documentation, which was scribed in my presence. The recorded information has been reviewed and is accurate.  Final Clinical Impressions(s) / ED Diagnoses   Final diagnoses:  Seizure [facility])  Hyperglycemia    New Prescriptions Discharge Medication List as of 10/07/2016  9:11 PM        Quintella Reichert, MD 10/07/16 2336

## 2017-01-03 ENCOUNTER — Emergency Department (HOSPITAL_BASED_OUTPATIENT_CLINIC_OR_DEPARTMENT_OTHER)
Admission: EM | Admit: 2017-01-03 | Discharge: 2017-01-03 | Disposition: A | Discharge status: Home / Self Care | Payer: Self-pay | Attending: Emergency Medicine | Admitting: Emergency Medicine

## 2017-01-03 ENCOUNTER — Encounter (HOSPITAL_BASED_OUTPATIENT_CLINIC_OR_DEPARTMENT_OTHER): Payer: Self-pay | Admitting: Emergency Medicine

## 2017-01-03 DIAGNOSIS — E7800 Pure hypercholesterolemia, unspecified: Secondary | ICD-10-CM | POA: Insufficient documentation

## 2017-01-03 DIAGNOSIS — I10 Essential (primary) hypertension: Secondary | ICD-10-CM | POA: Insufficient documentation

## 2017-01-03 DIAGNOSIS — R739 Hyperglycemia, unspecified: Secondary | ICD-10-CM

## 2017-01-03 DIAGNOSIS — Z79899 Other long term (current) drug therapy: Secondary | ICD-10-CM | POA: Insufficient documentation

## 2017-01-03 DIAGNOSIS — F17210 Nicotine dependence, cigarettes, uncomplicated: Secondary | ICD-10-CM | POA: Insufficient documentation

## 2017-01-03 DIAGNOSIS — K0889 Other specified disorders of teeth and supporting structures: Secondary | ICD-10-CM | POA: Insufficient documentation

## 2017-01-03 DIAGNOSIS — E1065 Type 1 diabetes mellitus with hyperglycemia: Secondary | ICD-10-CM | POA: Insufficient documentation

## 2017-01-03 DIAGNOSIS — Z794 Long term (current) use of insulin: Secondary | ICD-10-CM | POA: Insufficient documentation

## 2017-01-03 LAB — CBG MONITORING, ED
Glucose-Capillary: 457 mg/dL — ABNORMAL HIGH (ref 65–99)
Glucose-Capillary: 182 mg/dL — ABNORMAL HIGH (ref 65–99)

## 2017-01-03 LAB — COMPREHENSIVE METABOLIC PANEL
ALT: 11 U/L — AB (ref 14–54)
ANION GAP: 6 (ref 5–15)
AST: 13 U/L — ABNORMAL LOW (ref 15–41)
Albumin: 3.5 g/dL (ref 3.5–5.0)
Alkaline Phosphatase: 91 U/L (ref 38–126)
BUN: 11 mg/dL (ref 6–20)
CHLORIDE: 98 mmol/L — AB (ref 101–111)
CO2: 26 mmol/L (ref 22–32)
CREATININE: 0.63 mg/dL (ref 0.44–1.00)
Calcium: 8.7 mg/dL — ABNORMAL LOW (ref 8.9–10.3)
Glucose, Bld: 441 mg/dL — ABNORMAL HIGH (ref 65–99)
POTASSIUM: 4.5 mmol/L (ref 3.5–5.1)
Sodium: 130 mmol/L — ABNORMAL LOW (ref 135–145)
Total Bilirubin: 0.4 mg/dL (ref 0.3–1.2)
Total Protein: 6.9 g/dL (ref 6.5–8.1)

## 2017-01-03 LAB — CBC WITH DIFFERENTIAL/PLATELET
Basophils Absolute: 0 10*3/uL (ref 0.0–0.1)
Basophils Relative: 0 %
EOS PCT: 1 %
Eosinophils Absolute: 0 10*3/uL (ref 0.0–0.7)
HCT: 33.2 % — ABNORMAL LOW (ref 36.0–46.0)
Hemoglobin: 11.4 g/dL — ABNORMAL LOW (ref 12.0–15.0)
LYMPHS ABS: 2.9 10*3/uL (ref 0.7–4.0)
LYMPHS PCT: 46 %
MCH: 31.4 pg (ref 26.0–34.0)
MCHC: 34.3 g/dL (ref 30.0–36.0)
MCV: 91.5 fL (ref 78.0–100.0)
MONO ABS: 0.3 10*3/uL (ref 0.1–1.0)
Monocytes Relative: 5 %
Neutro Abs: 3.1 10*3/uL (ref 1.7–7.7)
Neutrophils Relative %: 48 %
PLATELETS: 357 10*3/uL (ref 150–400)
RBC: 3.63 MIL/uL — ABNORMAL LOW (ref 3.87–5.11)
RDW: 14.5 % (ref 11.5–15.5)
WBC: 6.3 10*3/uL (ref 4.0–10.5)

## 2017-01-03 LAB — I-STAT CHEM 8, ED
BUN: 11 mg/dL (ref 6–20)
CALCIUM ION: 1.22 mmol/L (ref 1.15–1.40)
Chloride: 96 mmol/L — ABNORMAL LOW (ref 101–111)
Creatinine, Ser: 0.6 mg/dL (ref 0.44–1.00)
Glucose, Bld: 438 mg/dL — ABNORMAL HIGH (ref 65–99)
HEMATOCRIT: 36 % (ref 36.0–46.0)
HEMOGLOBIN: 12.2 g/dL (ref 12.0–15.0)
Potassium: 4.1 mmol/L (ref 3.5–5.1)
SODIUM: 133 mmol/L — AB (ref 135–145)
TCO2: 25 mmol/L (ref 22–32)

## 2017-01-03 LAB — I-STAT VENOUS BLOOD GAS, ED
Bicarbonate: 25.2 mmol/L (ref 20.0–28.0)
O2 SAT: 52 %
PH VEN: 7.374 (ref 7.250–7.430)
PO2 VEN: 28 mmHg — AB (ref 32.0–45.0)
TCO2: 27 mmol/L (ref 22–32)
pCO2, Ven: 43 mmHg — ABNORMAL LOW (ref 44.0–60.0)

## 2017-01-03 MED ORDER — INSULIN ASPART 100 UNIT/ML ~~LOC~~ SOLN: 10.0000 [IU] | Freq: Once | SUBCUTANEOUS | Status: DC

## 2017-01-03 MED ORDER — CLINDAMYCIN HCL 150 MG PO CAPS: 450.0000 mg | ORAL_CAPSULE | Freq: Three times a day (TID) | ORAL | 0 refills | Status: AC

## 2017-01-03 MED ORDER — INSULIN ASPART 100 UNIT/ML ~~LOC~~ SOLN
5.0000 [IU] | Freq: Once | SUBCUTANEOUS | Status: AC
  Administered 2017-01-03: 5 [IU] via SUBCUTANEOUS
  Filled 2017-01-03: qty 1

## 2017-01-03 MED ORDER — CLINDAMYCIN PHOSPHATE 600 MG/50ML IV SOLN
600.0000 mg | Freq: Once | INTRAVENOUS | Status: AC
  Administered 2017-01-03: 600 mg via INTRAVENOUS
  Filled 2017-01-03: qty 50

## 2017-01-03 MED ORDER — SODIUM CHLORIDE 0.9 % IV BOLUS (SEPSIS)
1000.0000 mL | Freq: Once | INTRAVENOUS | Status: AC
  Administered 2017-01-03: 1000 mL via INTRAVENOUS

## 2017-01-03 MED ORDER — MORPHINE SULFATE (PF) 4 MG/ML IV SOLN
4.0000 mg | Freq: Once | INTRAVENOUS | Status: AC
  Administered 2017-01-03: 4 mg via INTRAVENOUS
  Filled 2017-01-03: qty 1

## 2017-01-03 NOTE — ED Notes (Signed)
Pt discharged to home with family. NAD.  

## 2017-01-03 NOTE — ED Provider Notes (Signed)
[facility] Provider Note   CSN: [id number] Arrival date & time: 01/03/17  1052     History   Chief Complaint Chief Complaint  Patient presents with  . Dental Pain  . Hyperglycemia    HPI Zoe Clay is a 41 y.o. female.  HPI  Patient, with a past medical history of hypertension, diabetes, presents to ED for evaluation of dental pain for the past week. She reports dental pain on both sides similar to her previous symptoms. She is scheduled to meet with her dentist next month. She has tried ibuprofen with no relief in her symptoms. She denies any neck pain, trouble moving, fever, chills, rashes, sore throat, trouble breathing. Patient states she is compliant with her home insulin but states that every time that she has an infection it causes her blood sugar to be elevated. She denies nausea, vomiting, abdominal pain, urinary symptoms.  Past Medical History:  Diagnosis Date  . Bipolar 1 disorder ([place])   . Diabetes mellitus   . High cholesterol   . Hypertension   . Peripheral neuropathy   . Schizophrenia, acute   . Seizures ([place])    last seizure May 2016  . Thyroid disease     Patient Active Problem List   Diagnosis Date Noted  . DKA (diabetic ketoacidoses) ([place]) 06/20/2012  . Nausea with vomiting 06/20/2012  . 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 Seizure disorder ([place]) 06/20/2012  . Unspecified hypothyroidism 06/20/2012  . Hyponatremia 06/20/2012    Past Surgical History:  Procedure Laterality Date  . ANKLE SURGERY    . TUBAL LIGATION      OB History    No data available       Home Medications    Prior to Admission medications   Medication Sig Start Date End Date Taking? Authorizing Provider  busPIRone (BUSPAR) 5 MG tablet Take 5 mg by mouth 2 (two) times daily.    [provider]  clindamycin (CLEOCIN) 150 MG capsule Take 3 capsules (450 mg total) by mouth 3 (three) times daily.  01/03/17 01/10/17  Carly Sabo, PA-C  DULoxetine (CYMBALTA) 60 MG capsule Take 60 mg by mouth daily.    [provider]  gabapentin (NEURONTIN) 600 MG tablet Take 1 tablet (600 mg total) by mouth 2 (two) times daily. 01/10/15   Katheren Shams, DO  hydrOXYzine (ATARAX/VISTARIL) 25 MG tablet Take 1 tablet (25 mg total) by mouth every 6 (six) hours. 12/13/15   Joy, Shawn C, PA-C  hydrOXYzine (ATARAX/VISTARIL) 25 MG tablet Take 1 tablet (25 mg total) by mouth every 8 (eight) hours as needed for anxiety. 08/21/16   Katheren Shams, DO  insulin aspart (NOVOLOG) 100 UNIT/ML injection Inject into the skin 3 (three) times daily before meals.    [provider]  insulin glargine (LANTUS) 100 UNIT/ML injection Inject 15 Units into the skin daily. Patient taking differently: Inject 20 Units into the skin daily.  06/22/12   Regalado, Belkys A, MD  levothyroxine (SYNTHROID, LEVOTHROID) 125 MCG tablet Take 125 mcg by mouth daily.      [provider]  lisinopril (PRINIVIL,ZESTRIL) 5 MG tablet Take 5 mg by mouth daily.    [provider]  phenytoin (DILANTIN) 100 MG ER capsule Take 200 mg by mouth 2 (two) times daily.     [provider]  phenytoin (DILANTIN) 200 MG ER capsule Take 1 capsule (200 mg total) by mouth 2 (two) times daily. 08/21/16  08/31/16  Katheren Shams, DO  risperiDONE (RISPERDAL) 2 MG tablet Take 2 mg by mouth 2 (two) times daily.    [provider]  risperiDONE (RISPERDAL) 2 MG tablet Take 1 tablet (2 mg total) by mouth 2 (two) times daily. 08/21/16 08/31/16  Katheren Shams, DO  simvastatin (ZOCOR) 20 MG tablet Take 20 mg by mouth every evening.    [provider]  traZODone (DESYREL) 100 MG tablet Take 100 mg by mouth at bedtime.    [provider]  traZODone (DESYREL) 100 MG tablet Take 1 tablet (100 mg total) by mouth at bedtime. 08/21/16   Katheren Shams, DO    Family History Family History  Problem Relation Age of Onset    . Diabetes Father   . Cancer Maternal Grandfather     Social History Social History  Substance Use Topics  . Smoking status: Current Every Day Smoker    Packs/day: 0.50    Types: Cigarettes  . Smokeless tobacco: Never Used  . Alcohol use No     Allergies   Iodine and Iohexol   Review of Systems Review of Systems  Constitutional: Negative for appetite change, chills and fever.  HENT: Positive for dental problem. Negative for drooling, ear pain, facial swelling, rhinorrhea, sneezing, sore throat and trouble swallowing.   Eyes: Negative for photophobia and visual disturbance.  Respiratory: Negative for cough, chest tightness, shortness of breath and wheezing.   Cardiovascular: Negative for chest pain and palpitations.  Gastrointestinal: Negative for abdominal pain, blood in stool, constipation, diarrhea, nausea and vomiting.  Genitourinary: Negative for dysuria, hematuria and urgency.  Musculoskeletal: Negative for myalgias.  Skin: Negative for rash.  Neurological: Negative for dizziness, weakness and light-headedness.     Physical Exam Updated Vital Signs BP 125/83 (BP Location: Left Arm)   Pulse 83   Temp 98.2 F (36.8 C) (Oral)   Resp 18   Ht 5\' 5"  (1.651 m)   Wt 63.5 kg (140 lb)   SpO2 100%   BMI 23.30 kg/m   Physical Exam  Constitutional: She appears well-developed and well-nourished. No distress.  Nontoxic appearing and in no acute distress. Able to speak in complete sentences.  HENT:  Head: Normocephalic and atraumatic.  Nose: Nose normal.  Mouth/Throat: Uvula is midline, oropharynx is clear and moist and mucous membranes are normal. No tonsillar exudate.    Poor dentition overall. There are 2 chipped teeth in the area indicated. No facial, neck or cheek swelling noted. No pooling of secretions or trismus.  Normal voice noted with no difficulty swallowing or breathing.  Eyes: Conjunctivae and EOM are normal. Left eye exhibits no discharge. No scleral  icterus.  Neck: Normal range of motion. Neck supple.  Cardiovascular: Normal rate, regular rhythm, normal heart sounds and intact distal pulses.  Exam reveals no gallop and no friction rub.   No murmur heard. Pulmonary/Chest: Effort normal and breath sounds normal. No respiratory distress.  Abdominal: Soft. Bowel sounds are normal. She exhibits no distension. There is no tenderness. There is no guarding.  Musculoskeletal: Normal range of motion. She exhibits no edema.  Neurological: She is alert. She exhibits normal muscle tone. Coordination normal.  Skin: Skin is warm and dry. No rash noted.  Psychiatric: She has a normal mood and affect.  Nursing note and vitals reviewed.    ED Treatments / Results  Labs (all labs ordered are listed, but only abnormal results are displayed) Labs Reviewed  CBC WITH DIFFERENTIAL/PLATELET - Abnormal; Notable  for the following:       Result Value   RBC 3.63 (*)    Hemoglobin 11.4 (*)    HCT 33.2 (*)    All other components within normal limits  COMPREHENSIVE METABOLIC PANEL - Abnormal; Notable for the following:    Sodium 130 (*)    Chloride 98 (*)    Glucose, Bld 441 (*)    Calcium 8.7 (*)    AST 13 (*)    ALT 11 (*)    All other components within normal limits  CBG MONITORING, ED - Abnormal; Notable for the following:    Glucose-Capillary 457 (*)    All other components within normal limits  I-STAT CHEM 8, ED - Abnormal; Notable for the following:    Sodium 133 (*)    Chloride 96 (*)    Glucose, Bld 438 (*)    All other components within normal limits  I-STAT VENOUS BLOOD GAS, ED - Abnormal; Notable for the following:    pCO2, Ven 43.0 (*)    pO2, Ven 28.0 (*)    All other components within normal limits  CBG MONITORING, ED - Abnormal; Notable for the following:    Glucose-Capillary 182 (*)    All other components within normal limits  BLOOD GAS, VENOUS    EKG  EKG Interpretation None       Radiology No results  found.  Procedures Procedures (including critical care time)  Medications Ordered in ED Medications  sodium chloride 0.9 % bolus 1,000 mL (0 mLs Intravenous Stopped 01/03/17 1531)  clindamycin (CLEOCIN) IVPB 600 mg (0 mg Intravenous Stopped 01/03/17 1427)  morphine 4 MG/ML injection 4 mg (4 mg Intravenous Given 01/03/17 1316)  insulin aspart (novoLOG) injection 5 Units (5 Units Subcutaneous Given 01/03/17 1316)     Initial Impression / Assessment and Plan / ED Course  I have reviewed the triage vital signs and the nursing notes.  Pertinent labs & imaging results that were available during my care of the patient were reviewed by me and considered in my medical decision making (see chart for details).     Patient presents to ED for evaluation of dental pain on both sides of the face for the past week. Previous history of similar symptoms. She is scheduled to meet with her dentist next month. She denies any neck pain, trismus, drooling. On physical exam there is overall poor dentition with several chipped or decaying teeth in the area causing pain. She has no neck pain, crepitus, trismus, drooling or changes in voice. I will suspicion for Ludwig angina or severe deep tissue infection of the neck. Patient also reports that her blood sugars have been running high due to the infection. She reports compliance with her home insulin. She denies any abdominal pain, nausea, vomiting, fevers. Initial blood glucose 457 on arrival. CMP with normal potassium and normal anion gap. VBG consistent with previous. No leukocytosis. Patient given fluids, IV clindamycin, pain medications and insulin here in the ED. Glucose improved to 182. No signs of DKA. Will discharge patient with clindamycin by mouth) and advise her to follow-up with dentist for further evaluation. Advised her to continue home medications including insulin as previously prescribed. Patient appears stable for discharge at this time. Strict return  precautions given.  Final Clinical Impressions(s) / ED Diagnoses   Final diagnoses:  Hyperglycemia  Pain, dental    New Prescriptions Discharge Medication List as of 01/03/2017  3:22 PM    START taking these medications  Details  clindamycin (CLEOCIN) 150 MG capsule Take 3 capsules (450 mg total) by mouth 3 (three) times daily., Starting Sat 01/03/2017, Until Sat 01/10/2017, Print         Delia Heady, PA-C 01/03/17 1631    Drenda Freeze, MD 01/05/17 2037

## 2017-01-03 NOTE — ED Notes (Signed)
Attempted IV x 2, tol well. ED MD informed

## 2017-01-03 NOTE — ED Triage Notes (Addendum)
Pt reports dental pain to both sides, has broken teeth on both sides. Taking ibuprofen without relief. Pt is also diabetic and states her sugar has been running high.

## 2017-01-03 NOTE — ED Notes (Signed)
Attempted IV access x 2 w/o success. 

## 2017-01-03 NOTE — Discharge Instructions (Signed)
Please read attached information regarding your condition and dental resource guide. Take clindamycin 3 times daily for 1 week. Take ibuprofen as needed for pain. Continue home medications including insulin as prescribed. Return to ED for worsening pain, trouble breathing, trouble swallowing, neck pain, fevers.

## 2017-01-12 ENCOUNTER — Emergency Department (HOSPITAL_BASED_OUTPATIENT_CLINIC_OR_DEPARTMENT_OTHER)
Admission: EM | Admit: 2017-01-12 | Discharge: 2017-01-12 | Disposition: A | Discharge status: Home / Self Care | Payer: Self-pay | Attending: Emergency Medicine | Admitting: Emergency Medicine

## 2017-01-12 ENCOUNTER — Encounter (HOSPITAL_BASED_OUTPATIENT_CLINIC_OR_DEPARTMENT_OTHER): Payer: Self-pay

## 2017-01-12 DIAGNOSIS — K047 Periapical abscess without sinus: Secondary | ICD-10-CM | POA: Insufficient documentation

## 2017-01-12 DIAGNOSIS — I10 Essential (primary) hypertension: Secondary | ICD-10-CM | POA: Insufficient documentation

## 2017-01-12 DIAGNOSIS — Z794 Long term (current) use of insulin: Secondary | ICD-10-CM | POA: Insufficient documentation

## 2017-01-12 DIAGNOSIS — E109 Type 1 diabetes mellitus without complications: Secondary | ICD-10-CM | POA: Insufficient documentation

## 2017-01-12 DIAGNOSIS — E039 Hypothyroidism, unspecified: Secondary | ICD-10-CM | POA: Insufficient documentation

## 2017-01-12 DIAGNOSIS — F17210 Nicotine dependence, cigarettes, uncomplicated: Secondary | ICD-10-CM | POA: Insufficient documentation

## 2017-01-12 DIAGNOSIS — K029 Dental caries, unspecified: Secondary | ICD-10-CM | POA: Insufficient documentation

## 2017-01-12 DIAGNOSIS — Z79899 Other long term (current) drug therapy: Secondary | ICD-10-CM | POA: Insufficient documentation

## 2017-01-12 LAB — CBG MONITORING, ED: Glucose-Capillary: 186 mg/dL — ABNORMAL HIGH (ref 65–99)

## 2017-01-12 MED ORDER — NAPROXEN 500 MG PO TABS: 500.0000 mg | ORAL_TABLET | Freq: Two times a day (BID) | ORAL | 0 refills | Status: DC

## 2017-01-12 MED ORDER — NAPROXEN 250 MG PO TABS: 500.0000 mg | ORAL_TABLET | Freq: Once | ORAL | Status: DC

## 2017-01-12 NOTE — ED Triage Notes (Signed)
Pt has dental pain that radiates to her bilat ears x1 week. Pt has a appointment to get her tooth pulled on Thursday.

## 2017-01-12 NOTE — Discharge Instructions (Signed)
Please read instructions below. Continue taking your antibiotics until they are gone. You can take Naproxen up to 2 times per day with meals, as needed for pain. Attend your appointment with a dentist on Thursday.  Return to the ER for difficulty swallowing or breathing, fever, or new or worsening symptoms.

## 2017-01-12 NOTE — ED Provider Notes (Signed)
[facility] Provider Note   CSN: [id number] Arrival date & time: 01/12/17  1017     History   Chief Complaint Chief Complaint  Patient presents with  . Dental Pain    HPI Zoe Clay is a 41 y.o. female with past medical history of type 1 diabetes, hypertension, sending to the ED for subsequent visit for bilateral lower dental pain began in September. Patient seen in ED on 01/03/2017, and was discharged with clindamycin. She states she has been taking the antibiotics as prescribed. She states she has a dental appointment this Thursday, however reports here for persistent pain that is not significantly relieved with Advil or Tylenol. States her blood glucose this morning was 110, and she has been monitoring it closely. Denies difficulty breathing or swallowing, fever/chills, nausea, or changes in symptoms today.  The history is provided by the patient.    Past Medical History:  Diagnosis Date  . Bipolar 1 disorder ([place])   . Diabetes mellitus   . High cholesterol   . Hypertension   . Peripheral neuropathy   . Schizophrenia, acute ([place])   . Seizures ([place])    last seizure May 2016  . Thyroid disease     Patient Active Problem List   Diagnosis Date Noted  . DKA (diabetic ketoacidoses) ([place]) 06/20/2012  . Nausea with vomiting 06/20/2012  . 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 Seizure disorder ([place]) 06/20/2012  . Unspecified hypothyroidism 06/20/2012  . Hyponatremia 06/20/2012    Past Surgical History:  Procedure Laterality Date  . ANKLE SURGERY    . TUBAL LIGATION      OB History    No data available       Home Medications    Prior to Admission medications   Medication Sig Start Date End Date Taking? Authorizing Provider  busPIRone (BUSPAR) 5 MG tablet Take 5 mg by mouth 2 (two) times daily.    [provider]  DULoxetine (CYMBALTA) 60 MG capsule Take 60 mg by mouth daily.    [provider]  gabapentin (NEURONTIN) 600 MG tablet Take 1 tablet (600 mg total) by mouth 2 (two) times daily. 01/10/15   Katheren Shams, DO  hydrOXYzine (ATARAX/VISTARIL) 25 MG tablet Take 1 tablet (25 mg total) by mouth every 6 (six) hours. 12/13/15   Joy, Shawn C, PA-C  hydrOXYzine (ATARAX/VISTARIL) 25 MG tablet Take 1 tablet (25 mg total) by mouth every 8 (eight) hours as needed for anxiety. 08/21/16   Katheren Shams, DO  insulin aspart (NOVOLOG) 100 UNIT/ML injection Inject into the skin 3 (three) times daily before meals.    [provider]  insulin glargine (LANTUS) 100 UNIT/ML injection Inject 15 Units into the skin daily. Patient taking differently: Inject 20 Units into the skin daily.  06/22/12   Regalado, Belkys A, MD  levothyroxine (SYNTHROID, LEVOTHROID) 125 MCG tablet Take 125 mcg by mouth daily.      [provider]  lisinopril (PRINIVIL,ZESTRIL) 5 MG tablet Take 5 mg by mouth daily.    [provider]  naproxen (NAPROSYN) 500 MG tablet Take 1 tablet (500 mg total) by mouth 2 (two) times daily. 01/12/17   Russo, [place] N, PA-C  phenytoin (DILANTIN) 100 MG ER capsule Take 200 mg by mouth 2 (two) times daily.     [provider]  phenytoin (DILANTIN) 200 MG ER capsule Take 1 capsule (200 mg total) by mouth 2 (two) times daily. 08/21/16  08/31/16  Katheren Shams, DO  risperiDONE (RISPERDAL) 2 MG tablet Take 2 mg by mouth 2 (two) times daily.    [provider]  risperiDONE (RISPERDAL) 2 MG tablet Take 1 tablet (2 mg total) by mouth 2 (two) times daily. 08/21/16 08/31/16  Katheren Shams, DO  simvastatin (ZOCOR) 20 MG tablet Take 20 mg by mouth every evening.    [provider]  traZODone (DESYREL) 100 MG tablet Take 100 mg by mouth at bedtime.    [provider]  traZODone (DESYREL) 100 MG tablet Take 1 tablet (100 mg total) by mouth at bedtime. 08/21/16   Katheren Shams, DO    Family History Family History  Problem Relation  Age of Onset  . Diabetes Father   . Cancer Maternal Grandfather     Social History Social History  Substance Use Topics  . Smoking status: Current Every Day Smoker    Packs/day: 0.50    Types: Cigarettes  . Smokeless tobacco: Never Used  . Alcohol use No     Allergies   Iodine and Iohexol   Review of Systems Review of Systems  Constitutional: Negative for chills and fever.  HENT: Positive for dental problem. Negative for facial swelling, trouble swallowing and voice change.   Respiratory: Negative for choking and shortness of breath.   Gastrointestinal: Negative for nausea.     Physical Exam Updated Vital Signs BP 108/80 (BP Location: Right Arm)   Pulse 90   Temp 98 F (36.7 C) (Oral)   Resp 18   Ht 5\' 5"  (1.651 m)   Wt 65.8 kg (145 lb)   SpO2 100%   BMI 24.13 kg/m   Physical Exam  Constitutional: She appears well-developed and well-nourished. No distress.  Tolerating secretions  HENT:  Head: Normocephalic and atraumatic.  Mouth/Throat: Uvula is midline and oropharynx is clear and moist. No trismus in the jaw. Dental caries present. No dental abscesses or uvula swelling.    Poor dentition throughout mouth. Bilateral lower molars with decay and mild surrounding gingival erythema, however no fluctuant dental abscess. No facial swelling. Floor of mouth is soft and nontender. No evidence of peritonsillar abscess or deep space infection.  Eyes: Conjunctivae are normal.  Cardiovascular: Normal rate and intact distal pulses.   Pulmonary/Chest: Effort normal and breath sounds normal. No respiratory distress.  Psychiatric: She has a normal mood and affect. Her behavior is normal.  Nursing note and vitals reviewed.    ED Treatments / Results  Labs (all labs ordered are listed, but only abnormal results are displayed) Labs Reviewed  CBG MONITORING, ED - Abnormal; Notable for the following:       Result Value   Glucose-Capillary 186 (*)    All other components  within normal limits    EKG  EKG Interpretation None       Radiology No results found.  Procedures Procedures (including critical care time)  Medications Ordered in ED Medications - No data to display   Initial Impression / Assessment and Plan / ED Course  I have reviewed the triage vital signs and the nursing notes.  Pertinent labs & imaging results that were available during my care of the patient were reviewed by me and considered in my medical decision making (see chart for details).     Asian presenting for subsequent visit with dental pain. Patient taking clindamycin since previous visit on 01/03/2017. Dental appointment scheduled for this Thursday, however patient with continued pain not relieved with OTC  Advil and Tylenol. No gross abscess.  VSS, afebrile, tolerating secretions. Exam unconcerning for peritonsillar abscess, Ludwig's angina or spread of infection. CBG 186. Recommend patient continue taking clindamycin until it is finished, and attend her dental appointment. Will discharge with Naprosyn. Pt safe for discharge.  Discussed results, findings, treatment and follow up. Patient advised of return precautions. Patient verbalized understanding and agreed with plan.  Final Clinical Impressions(s) / ED Diagnoses   Final diagnoses:  Pain due to dental caries    New Prescriptions New Prescriptions   NAPROXEN (NAPROSYN) 500 MG TABLET    Take 1 tablet (500 mg total) by mouth 2 (two) times daily.     Russo, [place] N, PA-C 01/12/17 1201    Davonna Belling, MD 01/13/17 1537

## 2017-01-28 ENCOUNTER — Emergency Department (HOSPITAL_BASED_OUTPATIENT_CLINIC_OR_DEPARTMENT_OTHER)
Admission: EM | Admit: 2017-01-28 | Discharge: 2017-01-28 | Disposition: A | Discharge status: Home / Self Care | Payer: Self-pay | Attending: Emergency Medicine | Admitting: Emergency Medicine

## 2017-01-28 ENCOUNTER — Encounter (HOSPITAL_BASED_OUTPATIENT_CLINIC_OR_DEPARTMENT_OTHER): Payer: Self-pay | Admitting: *Deleted

## 2017-01-28 DIAGNOSIS — I10 Essential (primary) hypertension: Secondary | ICD-10-CM | POA: Insufficient documentation

## 2017-01-28 DIAGNOSIS — M79662 Pain in left lower leg: Secondary | ICD-10-CM | POA: Insufficient documentation

## 2017-01-28 DIAGNOSIS — G629 Polyneuropathy, unspecified: Secondary | ICD-10-CM | POA: Insufficient documentation

## 2017-01-28 DIAGNOSIS — Z79899 Other long term (current) drug therapy: Secondary | ICD-10-CM | POA: Insufficient documentation

## 2017-01-28 DIAGNOSIS — I890 Lymphedema, not elsewhere classified: Secondary | ICD-10-CM | POA: Insufficient documentation

## 2017-01-28 DIAGNOSIS — Z794 Long term (current) use of insulin: Secondary | ICD-10-CM | POA: Insufficient documentation

## 2017-01-28 DIAGNOSIS — E039 Hypothyroidism, unspecified: Secondary | ICD-10-CM | POA: Insufficient documentation

## 2017-01-28 DIAGNOSIS — E1040 Type 1 diabetes mellitus with diabetic neuropathy, unspecified: Secondary | ICD-10-CM | POA: Insufficient documentation

## 2017-01-28 DIAGNOSIS — F17210 Nicotine dependence, cigarettes, uncomplicated: Secondary | ICD-10-CM | POA: Insufficient documentation

## 2017-01-28 NOTE — ED Notes (Signed)
Pt stated she's going to take a shower when she gets home and then wrap her own legs. RN Alyse Low) notified.

## 2017-01-28 NOTE — ED Provider Notes (Signed)
[facility] EMERGENCY DEPARTMENT Provider Note   CSN: [id number] Arrival date & time: 01/28/17  1516     History   Chief Complaint Chief Complaint  Patient presents with  . Peripheral Neuropathy    HPI Z[facility] is a 41 y.o. female.  The history is provided by the patient.  Leg Pain   This is a chronic problem. The current episode started yesterday. The problem occurs constantly. The problem has been gradually worsening. The pain is present in the left lower leg, right lower leg, right ankle, right foot, left ankle and left foot. The quality of the pain is described as aching. The pain is moderate. Associated symptoms include stiffness. Pertinent negatives include no numbness and no tingling. The symptoms are aggravated by activity and standing. She has tried nothing for the symptoms. The treatment provided no relief. There has been no history of extremity trauma.   41 year old female who presents with bilateral leg pain and swelling. This is chronic issue. History of DM w/ peripheral neuropathy, chronic lymphedema, schizophrenia, HTN, and HLD. Reports acute on chronic bilateral leg pain and swelling for one day. She was a work yesterday, sitting down majority of day, and states she felt like her legs have been more swollen from sitting. Pain has worsened. No numbness or weakness, fall or injury, chest pain or dyspnea. Use to wrap her legs but has not been doing it. Has been elevating legs on 2 pillows at night.  Past Medical History:  Diagnosis Date  . Bipolar 1 disorder ([place])   . Diabetes mellitus   . High cholesterol   . Hypertension   . Peripheral neuropathy   . Schizophrenia, acute ([place])   . Seizures ([place])    last seizure May 2016  . Thyroid disease     Patient Active Problem List   Diagnosis Date Noted  . DKA (diabetic ketoacidoses) ([place]) 06/20/2012  . Nausea with vomiting 06/20/2012  . 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 Seizure disorder ([place]) 06/20/2012  . Unspecified hypothyroidism 06/20/2012  . Hyponatremia 06/20/2012    Past Surgical History:  Procedure Laterality Date  . ANKLE SURGERY    . TUBAL LIGATION      OB History    No data available       Home Medications    Prior to Admission medications   Medication Sig Start Date End Date Taking? Authorizing Provider  busPIRone (BUSPAR) 5 MG tablet Take 5 mg by mouth 2 (two) times daily.    [provider]  DULoxetine (CYMBALTA) 60 MG capsule Take 60 mg by mouth daily.    [provider]  gabapentin (NEURONTIN) 600 MG tablet Take 1 tablet (600 mg total) by mouth 2 (two) times daily. 01/10/15   Katheren Shams, DO  hydrOXYzine (ATARAX/VISTARIL) 25 MG tablet Take 1 tablet (25 mg total) by mouth every 6 (six) hours. 12/13/15   Joy, Shawn C, PA-C  hydrOXYzine (ATARAX/VISTARIL) 25 MG tablet Take 1 tablet (25 mg total) by mouth every 8 (eight) hours as needed for anxiety. 08/21/16   Katheren Shams, DO  insulin aspart (NOVOLOG) 100 UNIT/ML injection Inject into the skin 3 (three) times daily before meals.    [provider]  insulin glargine (LANTUS) 100 UNIT/ML injection Inject 15 Units into the skin daily. Patient taking differently: Inject 20 Units into the skin daily.  06/22/12   Regalado, Cassie Freer, MD  levothyroxine (SYNTHROID, LEVOTHROID) 125  MCG tablet Take 125 mcg by mouth daily.      [provider]  lisinopril (PRINIVIL,ZESTRIL) 5 MG tablet Take 5 mg by mouth daily.    [provider]  naproxen (NAPROSYN) 500 MG tablet Take 1 tablet (500 mg total) by mouth 2 (two) times daily. 01/12/17   Russo, [place] N, PA-C  phenytoin (DILANTIN) 100 MG ER capsule Take 200 mg by mouth 2 (two) times daily.     [provider]  phenytoin (DILANTIN) 200 MG ER capsule Take 1 capsule (200 mg total) by mouth 2 (two) times daily. 08/21/16 08/31/16  Katheren Shams, DO  risperiDONE (RISPERDAL) 2  MG tablet Take 2 mg by mouth 2 (two) times daily.    [provider]  risperiDONE (RISPERDAL) 2 MG tablet Take 1 tablet (2 mg total) by mouth 2 (two) times daily. 08/21/16 08/31/16  Katheren Shams, DO  simvastatin (ZOCOR) 20 MG tablet Take 20 mg by mouth every evening.    [provider]  traZODone (DESYREL) 100 MG tablet Take 100 mg by mouth at bedtime.    [provider]  traZODone (DESYREL) 100 MG tablet Take 1 tablet (100 mg total) by mouth at bedtime. 08/21/16   Katheren Shams, DO    Family History Family History  Problem Relation Age of Onset  . Diabetes Father   . Cancer Maternal Grandfather     Social History Social History  Substance Use Topics  . Smoking status: Current Every Day Smoker    Packs/day: 0.50    Types: Cigarettes  . Smokeless tobacco: Never Used  . Alcohol use No     Allergies   Iodine and Iohexol   Review of Systems Review of Systems  Constitutional: Negative for fever.  Respiratory: Negative for shortness of breath.   Cardiovascular: Positive for leg swelling. Negative for chest pain.  Musculoskeletal: Positive for stiffness.  Skin: Negative for wound.  Allergic/Immunologic: Negative for immunocompromised state.  Neurological: Negative for tingling, weakness and numbness.  Hematological: Does not bruise/bleed easily.     Physical Exam Updated Vital Signs BP 130/78 (BP Location: Left Arm)   Pulse 97   Temp 98.4 F (36.9 C) (Oral)   Resp 18   Ht 5\' 5"  (1.651 m)   Wt 68 kg (150 lb)   SpO2 100%   BMI 24.96 kg/m   Physical Exam Physical Exam  Constitutional: Appears well-developed and well-nourished. No acute distress. HENT:  Head: Normocephalic.  Eyes: Conjunctivae are normal.  Neck: supple Cardiovascular: Normal rate and intact distal pulses in bilateral lower extremities.   Pulmonary/Chest: Effort normal. No respiratory distress. CTAB. Abdominal: Exhibits no distension. No tenderness. Musculoskeletal:  Normal range of motion. Exhibits no deformity. Edema in bilateral lower extremities to knees Neurological: Alert. Fluent speech. Full strength in bilateral lower extremities. Sensation in tact in bilateral lower extremities but diminished to the knees (baseline) Skin: Skin is warm and dry.  Psychiatric: Normal mood and affect. Behavior is normal.  Nursing note and vitals reviewed.   ED Treatments / Results  Labs (all labs ordered are listed, but only abnormal results are displayed) Labs Reviewed - No data to display  EKG  EKG Interpretation None       Radiology No results found.  Procedures Procedures (including critical care time)  Medications Ordered in ED Medications - No data to display   Initial Impression / Assessment and Plan / ED Course  I have reviewed the triage vital signs and the nursing  notes.  Pertinent labs & imaging results that were available during my care of the patient were reviewed by me and considered in my medical decision making (see chart for details).     Acute on chronic bilateral lower extremity swelling and pain. Compliant with neurontin. Records reviewed. History of noncompliance with her medical management. Has been seen multiple time in the PCPs office for management of this.  I discuss continued supportive care management. Offered Ace wraps that she has worn before for compression. Will continue elevation of leg at rest. We'll trial over-the-counter pain medications with her gabapentin. She will call her PCP for ongoing management regarding chronic lymphedema and pain in lower extremities.   No concerns for DVT. Has had negative vascular studies through PCP's office in past. No signs of infection. Vascularly in tact.   Strict return and follow-up instructions reviewed. She expressed understanding of all discharge instructions and felt comfortable with the plan of care.     Final Clinical Impressions(s) / ED Diagnoses   Final diagnoses:   Lymphedema  Neuropathy    New Prescriptions New Prescriptions   No medications on file     Forde Dandy, MD 01/28/17 [phone]

## 2017-01-28 NOTE — ED Triage Notes (Signed)
Neuropathy pain x 1 day in legs. Ambulatory.

## 2017-01-28 NOTE — Discharge Instructions (Signed)
Please keep legs in compression stockings or wraps while you are at work. Elevate legs at rest on 2 pillows Continue gabapentin. Take tylenol and ibuprofen for additional pain control  Please call your PCP tomorrow to discuss further treatment of your pain and swelling  Return for worsening symptoms, including difficulty breathing, fever, escalating pain and swelling or any other symptoms concerning to you.

## 2017-04-11 ENCOUNTER — Emergency Department (HOSPITAL_BASED_OUTPATIENT_CLINIC_OR_DEPARTMENT_OTHER)
Admission: EM | Admit: 2017-04-11 | Discharge: 2017-04-11 | Disposition: A | Discharge status: Home / Self Care | Payer: Self-pay | Attending: Emergency Medicine | Admitting: Emergency Medicine

## 2017-04-11 ENCOUNTER — Other Ambulatory Visit: Payer: Self-pay

## 2017-04-11 ENCOUNTER — Encounter (HOSPITAL_BASED_OUTPATIENT_CLINIC_OR_DEPARTMENT_OTHER): Payer: Self-pay | Admitting: Emergency Medicine

## 2017-04-11 DIAGNOSIS — Z794 Long term (current) use of insulin: Secondary | ICD-10-CM | POA: Insufficient documentation

## 2017-04-11 DIAGNOSIS — K0889 Other specified disorders of teeth and supporting structures: Secondary | ICD-10-CM | POA: Insufficient documentation

## 2017-04-11 DIAGNOSIS — F17210 Nicotine dependence, cigarettes, uncomplicated: Secondary | ICD-10-CM | POA: Insufficient documentation

## 2017-04-11 DIAGNOSIS — I10 Essential (primary) hypertension: Secondary | ICD-10-CM | POA: Insufficient documentation

## 2017-04-11 DIAGNOSIS — Z79899 Other long term (current) drug therapy: Secondary | ICD-10-CM | POA: Insufficient documentation

## 2017-04-11 DIAGNOSIS — E039 Hypothyroidism, unspecified: Secondary | ICD-10-CM | POA: Insufficient documentation

## 2017-04-11 DIAGNOSIS — E1010 Type 1 diabetes mellitus with ketoacidosis without coma: Secondary | ICD-10-CM | POA: Insufficient documentation

## 2017-04-11 MED ORDER — CLINDAMYCIN HCL 150 MG PO CAPS: 450.0000 mg | ORAL_CAPSULE | Freq: Three times a day (TID) | ORAL | 0 refills | Status: DC

## 2017-04-11 MED ORDER — NAPROXEN 500 MG PO TABS: 500.0000 mg | ORAL_TABLET | Freq: Two times a day (BID) | ORAL | 0 refills | Status: DC

## 2017-04-11 MED ORDER — NAPROXEN 250 MG PO TABS
500.0000 mg | ORAL_TABLET | Freq: Once | ORAL | Status: AC
  Administered 2017-04-11: 500 mg via ORAL
  Filled 2017-04-11: qty 2

## 2017-04-11 NOTE — Discharge Instructions (Signed)
Be sure to follow up with your dentist as scheduled on Friday. Call their office on Monday and inform them of your increase in pain and to let them know you have been started on Clindamycin and Naproxen.   I have prescribed you Clindamycin which is an antibiotic to treat the infection and Naproxen which is an anti-inflammatory medicine to treat the pain.   Please take all of your antibiotics until finished. You may develop abdominal discomfort or diarrhea from the antibiotic.  You may help offset this with probiotics which you can buy at the store (ask your pharmacist if unable to find) or get probiotics in the form of eating yogurt. Do not eat or take the probiotics until 2 hours after your antibiotic. If you are unable to tolerate these side effects follow-up with your primary care provider or return to the emergency department.   If you begin to experience any blistering, rashes, swelling, or difficulty breathing seek medical care for evaluation of potentially more serious side effects.   Be sure to eat something when taking the Naproxen as it can cause stomach upset and at worst stomach bleeding. Do not take additional non steroidal anti-inflammatory medicines such as Ibuprofen, Aleve, or Advil while taking Naproxen. You may supplement with Tylenol.   Please be aware that this medications may interact with other medications you are taking, please be sure to discuss your medication list with your pharmacist. If you are taking birth control the antibiotic will deactivate your birth control for 2 weeks.   If you start to experience and new or worsening symptoms return to the emergency department. If you start to experience fever, chills, neck stiffness/pain, or inability to move your neck come back to the emergency department immediately.

## 2017-04-11 NOTE — ED Triage Notes (Signed)
Dental pain x 1 week to right upper and lower . Scheduled to see dentist next Friday

## 2017-04-11 NOTE — ED Provider Notes (Signed)
[facility] Provider Note   CSN: [id number] Arrival date & time: 04/11/17  1049     History   Chief Complaint Chief Complaint  Patient presents with  . Dental Pain    HPI Zoe Clay is a 41 y.o. female with a hx of DM, HTN, seizures, and hypothyroidism who presents to the ED complaining of constant, progressively worsening right-sided dental pain for the past 1 week.  Pain is radiating to R ear. Has been taking Tylenol and 800 mg ibuprofen without significant relief.  Patient states the pain is worse when she leaves work and walks into the cold.  Patient states that she has had problems with her teeth that have been ongoing, she is currently seeing a dentist who plans for surgical removal of several teeth in 6 days.  Patient has required antibiotics for dental problems several times in the past, most recently was on clindamycin 9/22. Denies fever, chills, nausea, vomiting, difficulty swallowing, or neck pain.   HPI  Past Medical History:  Diagnosis Date  . Bipolar 1 disorder ([place])   . Diabetes mellitus   . High cholesterol   . Hypertension   . Peripheral neuropathy   . Schizophrenia, acute ([place])   . Seizures ([place])    last seizure May 2016  . Thyroid disease     Patient Active Problem List   Diagnosis Date Noted  . DKA (diabetic ketoacidoses) ([place]) 06/20/2012  . Nausea with vomiting 06/20/2012  . 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 Seizure disorder ([place]) 06/20/2012  . Unspecified hypothyroidism 06/20/2012  . Hyponatremia 06/20/2012    Past Surgical History:  Procedure Laterality Date  . ANKLE SURGERY    . TUBAL LIGATION      OB History    No data available       Home Medications    Prior to Admission medications   Medication Sig Start Date End Date Taking? Authorizing Provider  busPIRone (BUSPAR) 5 MG tablet Take 5 mg by mouth 2 (two) times daily.   Yes [provider]  DULoxetine (CYMBALTA) 60 MG capsule Take 60 mg by mouth daily.   Yes [provider]  insulin aspart (NOVOLOG) 100 UNIT/ML injection Inject into the skin 3 (three) times daily before meals.   Yes [provider]  levothyroxine (SYNTHROID, LEVOTHROID) 125 MCG tablet Take 125 mcg by mouth daily.     Yes [provider]  lisinopril (PRINIVIL,ZESTRIL) 5 MG tablet Take 5 mg by mouth daily.   Yes [provider]  phenytoin (DILANTIN) 100 MG ER capsule Take 200 mg by mouth 2 (two) times daily.    Yes [provider]  risperiDONE (RISPERDAL) 2 MG tablet Take 2 mg by mouth 2 (two) times daily.   Yes [provider]  simvastatin (ZOCOR) 20 MG tablet Take 20 mg by mouth every evening.   Yes [provider]  traZODone (DESYREL) 100 MG tablet Take 100 mg by mouth at bedtime.   Yes [provider]  gabapentin (NEURONTIN) 600 MG tablet Take 1 tablet (600 mg total) by mouth 2 (two) times daily. 01/10/15   Katheren Shams, DO  hydrOXYzine (ATARAX/VISTARIL) 25 MG tablet Take 1 tablet (25 mg total) by mouth every 6 (six) hours. 12/13/15   Joy, Shawn C, PA-C  hydrOXYzine (ATARAX/VISTARIL) 25 MG tablet Take 1 tablet (25 mg total) by mouth every 8 (eight) hours as needed for anxiety. 08/21/16   Gerarda Fraction,  Jazma Y, DO  insulin glargine (LANTUS) 100 UNIT/ML injection Inject 15 Units into the skin daily. Patient taking differently: Inject 20 Units into the skin daily.  06/22/12   Regalado, Belkys A, MD  naproxen (NAPROSYN) 500 MG tablet Take 1 tablet (500 mg total) by mouth 2 (two) times daily. 01/12/17   Robinson, [place] N, PA-C  phenytoin (DILANTIN) 200 MG ER capsule Take 1 capsule (200 mg total) by mouth 2 (two) times daily. 08/21/16 08/31/16  Katheren Shams, DO  risperiDONE (RISPERDAL) 2 MG tablet Take 1 tablet (2 mg total) by mouth 2 (two) times daily. 08/21/16 08/31/16  Katheren Shams, DO  traZODone (DESYREL) 100 MG tablet Take 1 tablet (100  mg total) by mouth at bedtime. 08/21/16   Katheren Shams, DO    Family History Family History  Problem Relation Age of Onset  . Diabetes Father   . Cancer Maternal Grandfather     Social History Social History   Tobacco Use  . Smoking status: Current Every Day Smoker    Packs/day: 0.50    Types: Cigarettes  . Smokeless tobacco: Never Used  Substance Use Topics  . Alcohol use: No  . Drug use: No     Allergies   Iodine and Iohexol   Review of Systems Review of Systems  Constitutional: Negative for chills and fever.  HENT: Positive for dental problem and ear pain (R). Negative for congestion, drooling, ear discharge, hearing loss, rhinorrhea, sore throat and trouble swallowing.   Respiratory: Negative for shortness of breath.   Cardiovascular: Negative for chest pain.  Musculoskeletal: Negative for neck pain.     Physical Exam Updated Vital Signs BP 127/88 (BP Location: Right Arm)   Pulse 88   Temp 98 F (36.7 C) (Oral)   Resp 18   LMP 04/02/2017 (Exact Date)   SpO2 100%   Physical Exam  Constitutional: She appears well-developed and well-nourished.  Non-toxic appearance. No distress.  HENT:  Head: Normocephalic and atraumatic.  Right Ear: Tympanic membrane is not perforated, not erythematous, not retracted and not bulging.  Left Ear: Tympanic membrane is not perforated, not erythematous, not retracted and not bulging.  Nose: Nose normal.  Mouth/Throat: Uvula is midline and oropharynx is clear and moist. No trismus in the jaw. Dental caries present. No dental abscesses. No oropharyngeal exudate or posterior oropharyngeal erythema.  Patient with poor dentition throughout. She has several cracked teeth, some of which are decayed to the gumline to her upper and lower gums. R upper and lower molars are tender to palpation with some mild surrounding gingival erythema. There is no fluctuance, no appreciable abscess. Patient is tolerating her secretions, no drooling, no  trismus. No evidence of peritonsillar abscess or deep space infection.   Eyes: Conjunctivae are normal. Right eye exhibits no discharge. Left eye exhibits no discharge.  Neck: Normal range of motion. Neck supple.  Cardiovascular: Normal rate and regular rhythm.  No murmur heard. Pulmonary/Chest: Breath sounds normal. No respiratory distress. She has no wheezes. She has no rales.  Lymphadenopathy:    She has no cervical adenopathy.  Neurological: She is alert.  Skin: Skin is warm and dry. No rash noted.  Psychiatric: She has a normal mood and affect. Her behavior is normal.  Nursing note and vitals reviewed.   ED Treatments / Results  Labs (all labs ordered are listed, but only abnormal results are displayed) Labs Reviewed - No data to display  EKG  EKG Interpretation None  Radiology No results found.  Procedures Procedures (including critical care time)  Medications Ordered in ED Medications  naproxen (NAPROSYN) tablet 500 mg (not administered)    Initial Impression / Assessment and Plan / ED Course  I have reviewed the triage vital signs and the nursing notes.  Pertinent labs & imaging results that were available during my care of the patient were reviewed by me and considered in my medical decision making (see chart for details).  Patient presents with dental pain with plan for dental surgical procedure 01/04. Patient is nontoxic appearing with stable vital signs.  No gross abscess.  Exam unconcerning for Ludwig's angina or spread of infection.  Patient with hx of DM, blood glucose readings at home have been in the 200s. Will treat with Clindamycin and Naproxen.  Instructed patient to call dentist office on Monday (12/31) to inform them of her increase in pain and of prescriptions given today, also instructed to follow up with dentist as scheduled. Discussed treatment plan and need for follow up as well as return precautions with the patient. Provided opportunity for  questions, patient confirmed understanding and is agreeable to plan.    Final Clinical Impressions(s) / ED Diagnoses   Final diagnoses:  Pain, dental    ED Discharge Orders        Ordered    naproxen (NAPROSYN) 500 MG tablet  2 times daily     04/11/17 1116    clindamycin (CLEOCIN) 150 MG capsule  3 times daily     04/11/17 [address], [place], PA-C 04/11/17 1132    Margette Fast, MD 04/11/17 2108

## 2017-05-12 IMAGING — US US TRANSVAGINAL NON-OB
1 series · 13 of 25 positions shown · non-contrast
Comparison: CT scan of [DATE].

CLINICAL DATA: Lower abdominal pain.

EXAM:
TRANSABDOMINAL AND TRANSVAGINAL ULTRASOUND OF PELVIS
DOPPLER ULTRASOUND OF OVARIES
TECHNIQUE: Both transabdominal and transvaginal ultrasound examinations of the
pelvis were performed. Transabdominal technique was performed for
global imaging of the pelvis including uterus, ovaries, adnexal
regions, and pelvic cul-de-sac.
It was necessary to proceed with endovaginal exam following the
transabdominal exam to visualize the endometrium and ovaries. Color
and duplex Doppler ultrasound was utilized to evaluate blood flow to
the ovaries.

[Series 1: us transvaginal non-ob · 0.24mm/px · 13 of 92 slices shown]
[im 1/92]
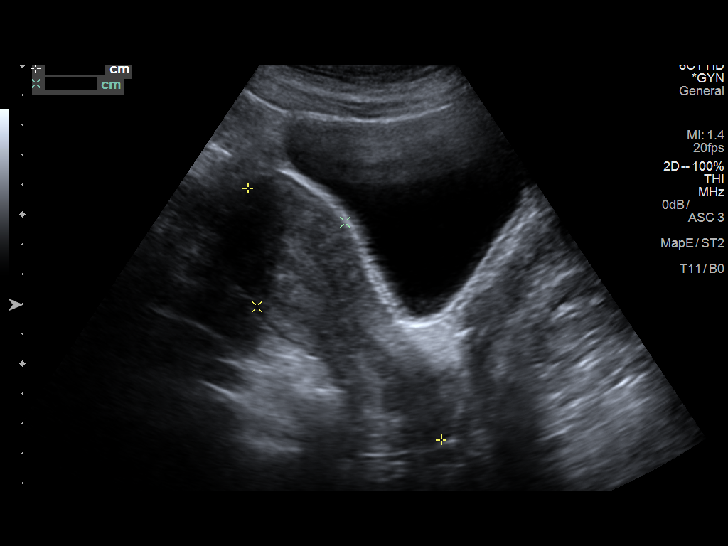
[im 8/92]
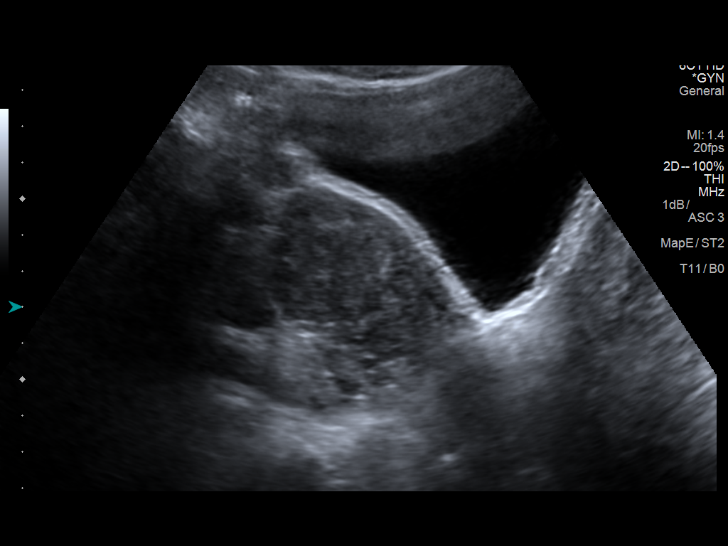
[im 16/92]
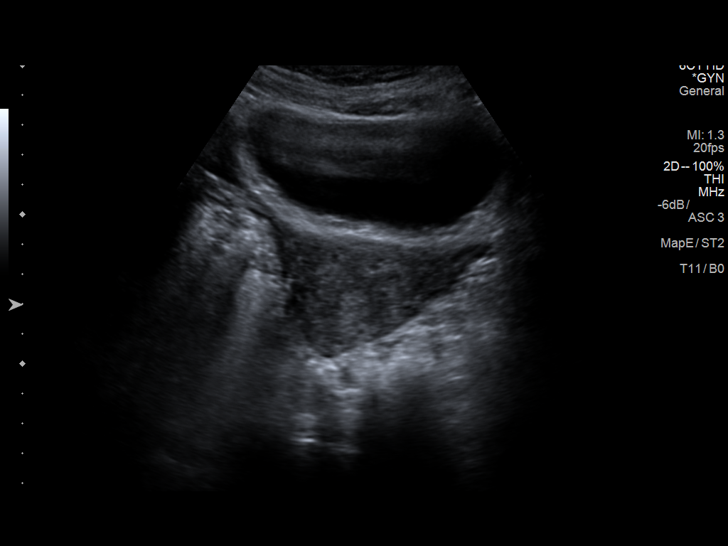
[im 23/92]
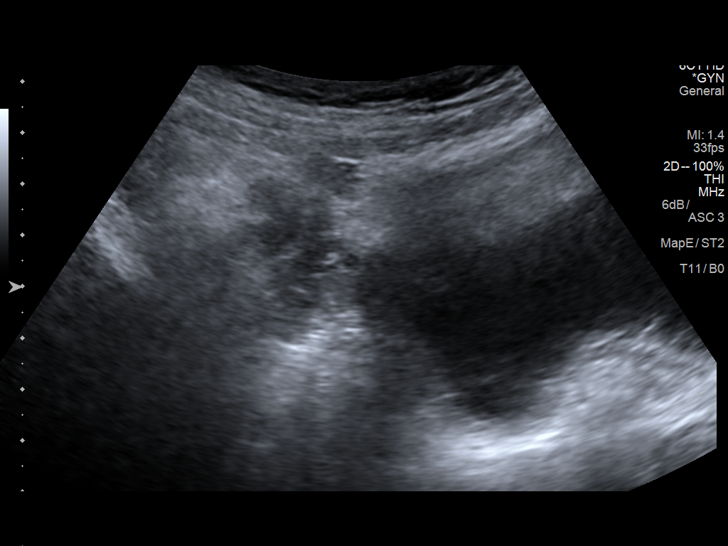
[im 31/92]
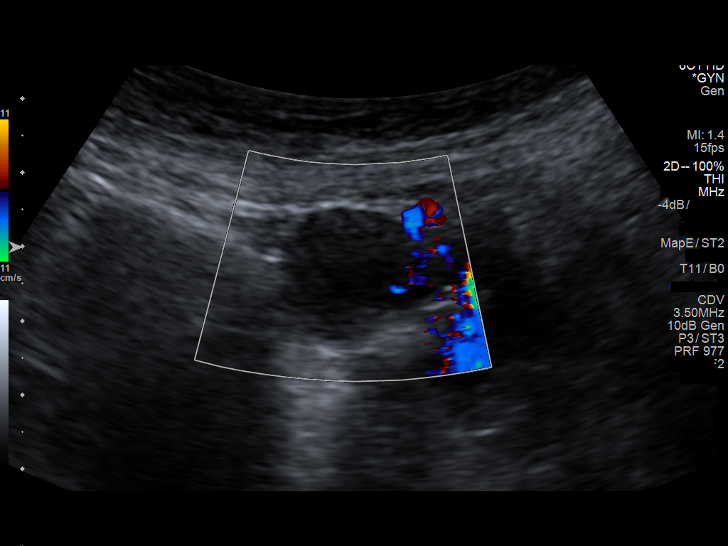
[im 38/92]
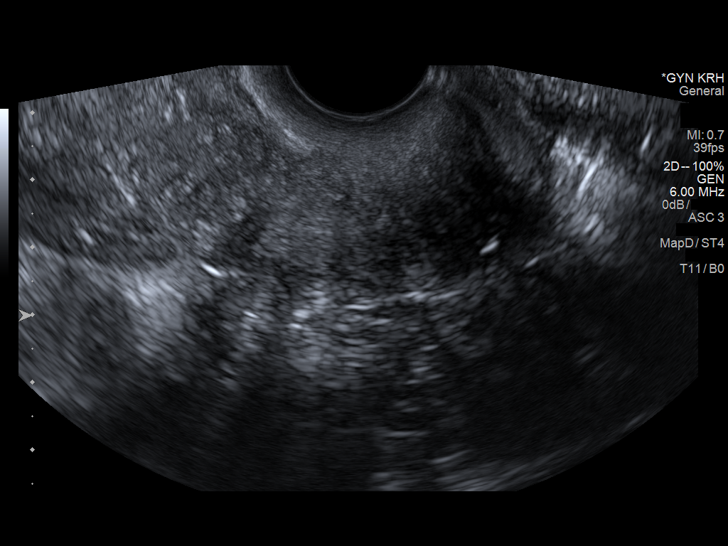
[im 46/92]
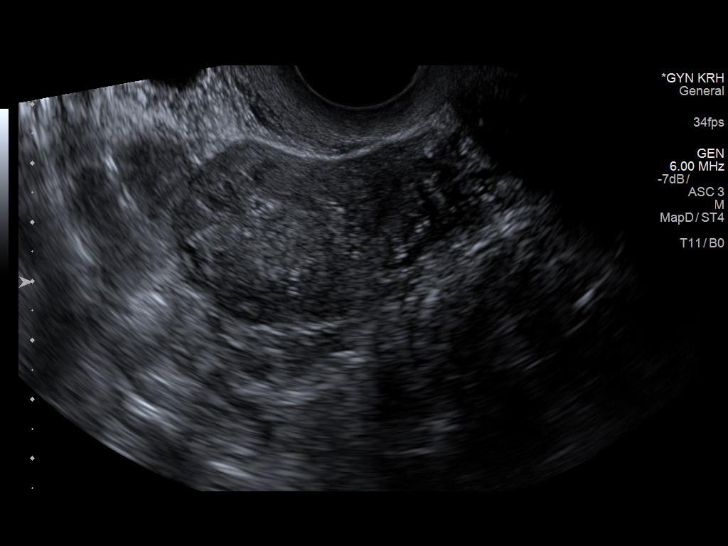
[im 54/92]
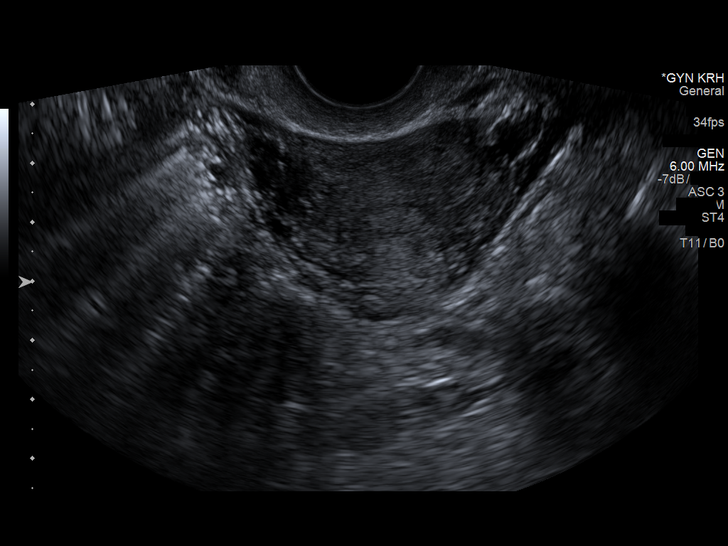
[im 61/92]
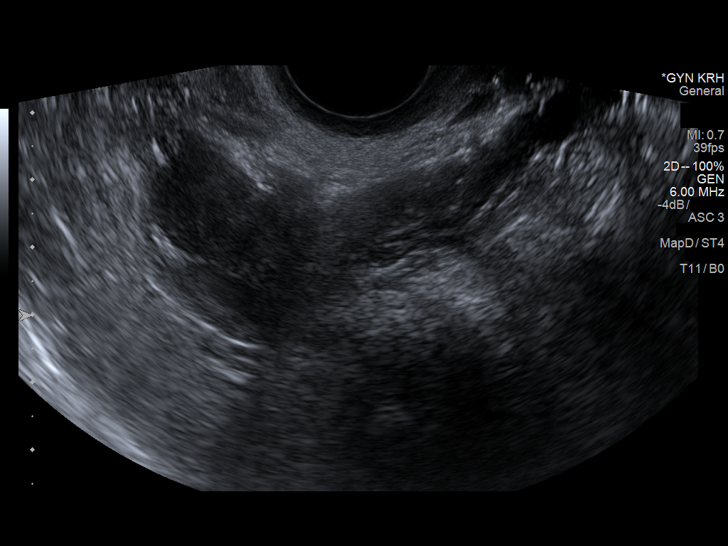
[im 69/92]
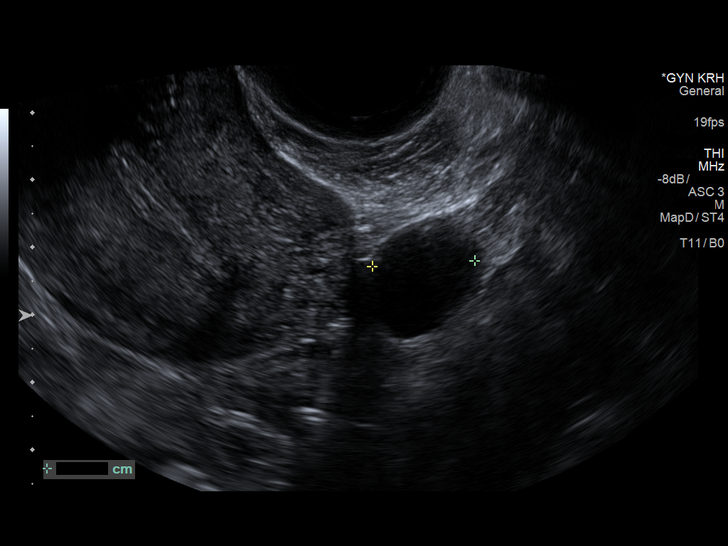
[im 76/92]
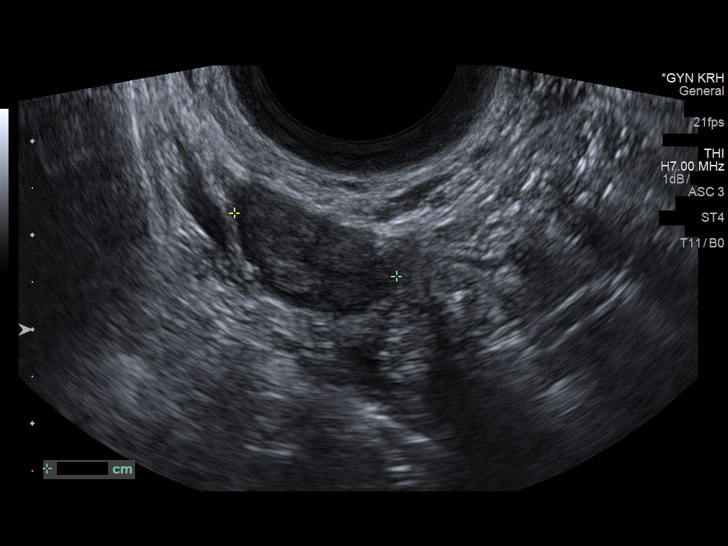
[im 84/92]
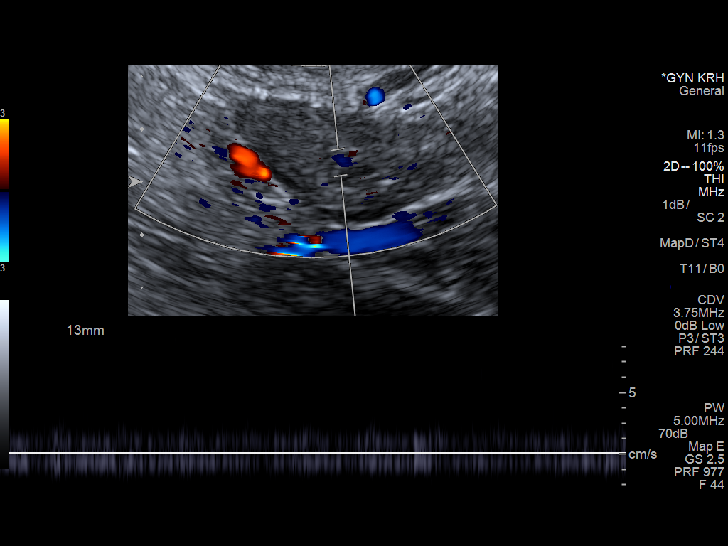
[im 92/92]
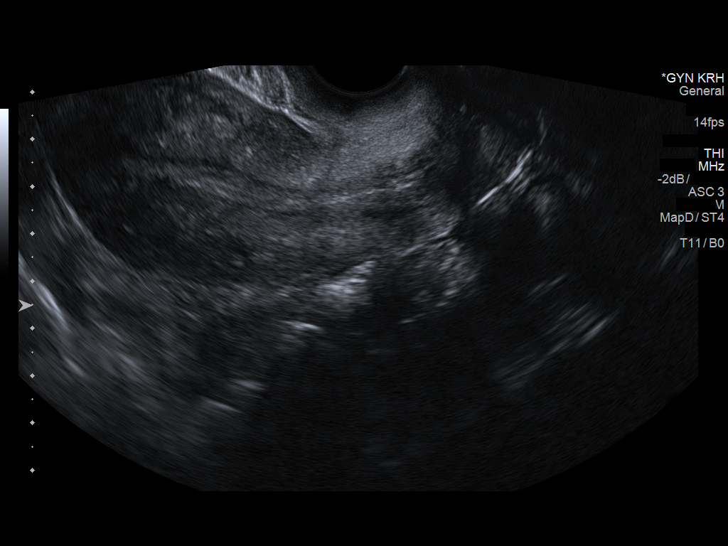

[13 of 25 positions shown; findings below may reference images not displayed]

FINDINGS: Uterus

Measurements: 10.3 x 5.6 x 4.4 cm. Two small fibroids are noted
posteriorly, with the largest measuring 6 mm.

Endometrium

Thickness: 5 mm which is within normal limits. No focal abnormality
visualized.

Right ovary

Measurements: 2.4 x 1.9 x 0.9 cm. Normal appearance/no adnexal mass.

Left ovary

Measurements: 3.6 x 2.2 x 2.0 cm.  1.6 cm follicular cyst is noted.

Pulsed Doppler evaluation of both ovaries demonstrates normal
low-resistance arterial and venous waveforms.

Other findings

Trace free fluid is noted which most likely is physiologic.
IMPRESSION: Small uterine fibroids. No other significant abnormality seen in the
pelvis.

## 2017-07-01 ENCOUNTER — Encounter (HOSPITAL_BASED_OUTPATIENT_CLINIC_OR_DEPARTMENT_OTHER): Payer: Self-pay | Admitting: Emergency Medicine

## 2017-07-01 ENCOUNTER — Other Ambulatory Visit: Payer: Self-pay

## 2017-07-01 ENCOUNTER — Emergency Department (HOSPITAL_BASED_OUTPATIENT_CLINIC_OR_DEPARTMENT_OTHER)
Admission: EM | Admit: 2017-07-01 | Discharge: 2017-07-01 | Disposition: A | Discharge status: Home / Self Care | Payer: Self-pay | Attending: Emergency Medicine | Admitting: Emergency Medicine

## 2017-07-01 ENCOUNTER — Emergency Department (HOSPITAL_BASED_OUTPATIENT_CLINIC_OR_DEPARTMENT_OTHER): Payer: Self-pay

## 2017-07-01 DIAGNOSIS — Y939 Activity, unspecified: Secondary | ICD-10-CM | POA: Insufficient documentation

## 2017-07-01 DIAGNOSIS — R51 Headache: Secondary | ICD-10-CM | POA: Insufficient documentation

## 2017-07-01 DIAGNOSIS — S91302A Unspecified open wound, left foot, initial encounter: Secondary | ICD-10-CM | POA: Insufficient documentation

## 2017-07-01 DIAGNOSIS — Z794 Long term (current) use of insulin: Secondary | ICD-10-CM | POA: Insufficient documentation

## 2017-07-01 DIAGNOSIS — E1140 Type 2 diabetes mellitus with diabetic neuropathy, unspecified: Secondary | ICD-10-CM | POA: Insufficient documentation

## 2017-07-01 DIAGNOSIS — E079 Disorder of thyroid, unspecified: Secondary | ICD-10-CM | POA: Insufficient documentation

## 2017-07-01 DIAGNOSIS — F17210 Nicotine dependence, cigarettes, uncomplicated: Secondary | ICD-10-CM | POA: Insufficient documentation

## 2017-07-01 DIAGNOSIS — Y998 Other external cause status: Secondary | ICD-10-CM | POA: Insufficient documentation

## 2017-07-01 DIAGNOSIS — X58XXXA Exposure to other specified factors, initial encounter: Secondary | ICD-10-CM | POA: Insufficient documentation

## 2017-07-01 DIAGNOSIS — Z79899 Other long term (current) drug therapy: Secondary | ICD-10-CM | POA: Insufficient documentation

## 2017-07-01 DIAGNOSIS — I10 Essential (primary) hypertension: Secondary | ICD-10-CM | POA: Insufficient documentation

## 2017-07-01 DIAGNOSIS — Y929 Unspecified place or not applicable: Secondary | ICD-10-CM | POA: Insufficient documentation

## 2017-07-01 DIAGNOSIS — R519 Headache, unspecified: Secondary | ICD-10-CM

## 2017-07-01 IMAGING — DX DG FOOT COMPLETE 3+V*L*
3 series · 3 of 3 positions shown · non-contrast
Comparison: None.

CLINICAL DATA: Open wound and dorsal aspect of foot between first
and second metatarsals for 3 weeks. Pain.

EXAM:
LEFT FOOT - COMPLETE 3+ VIEW

[foot ap]
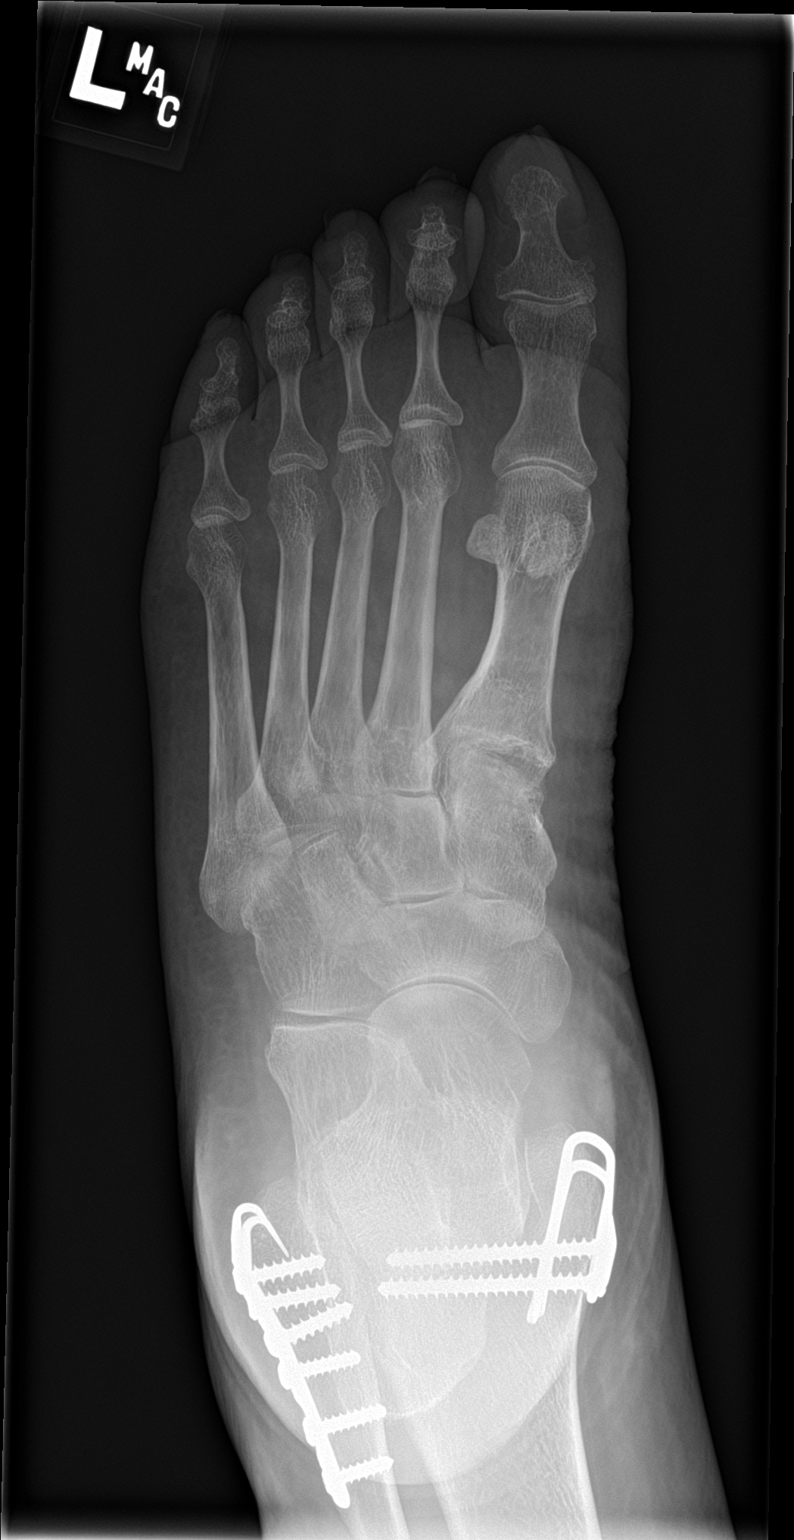

[foot obl]
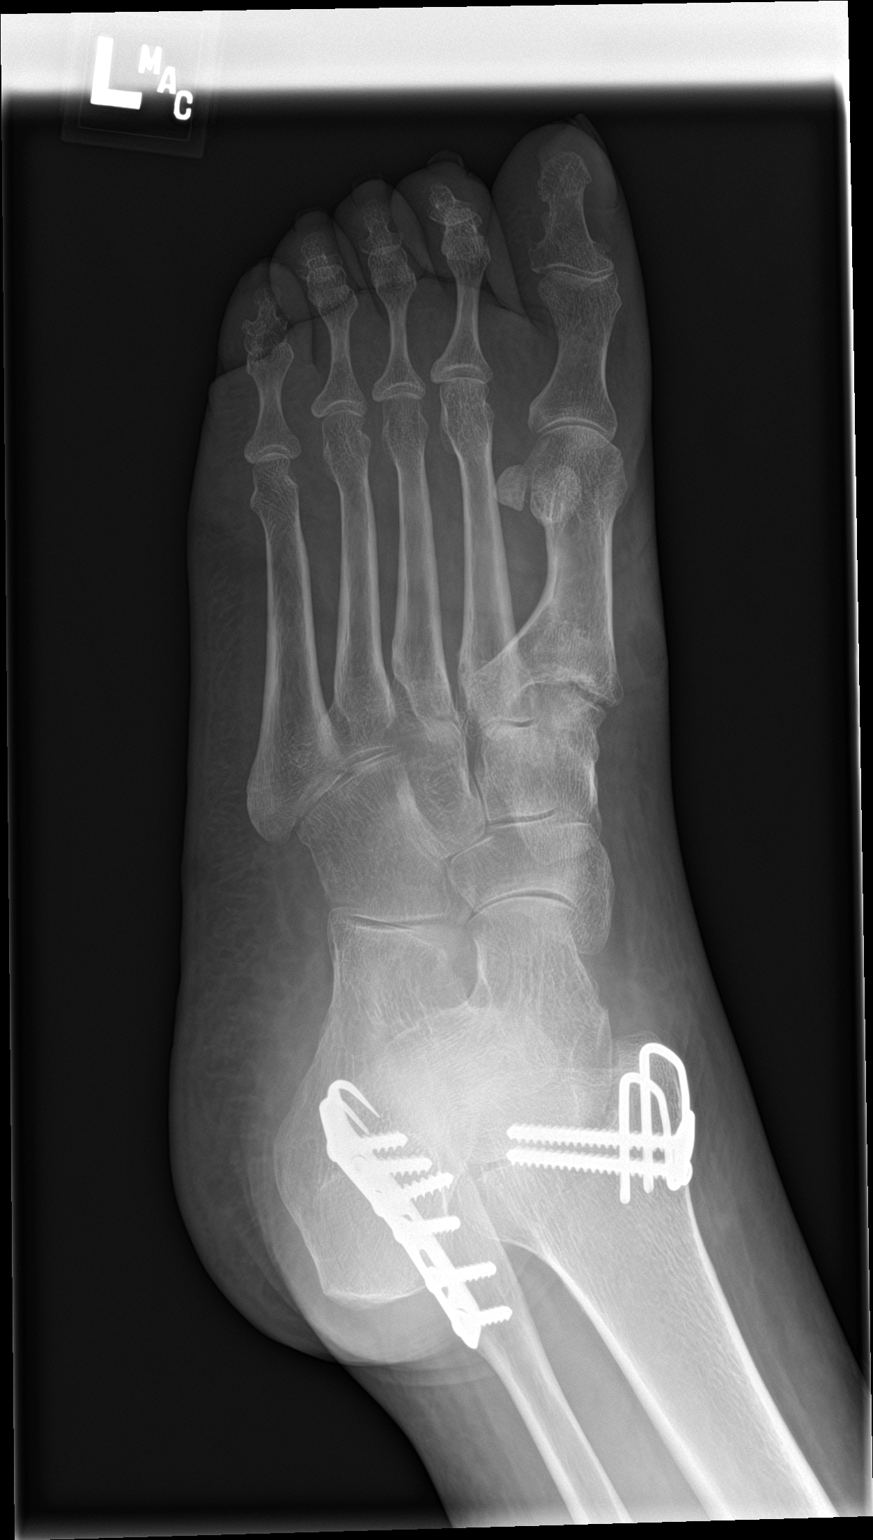

[foot lat]
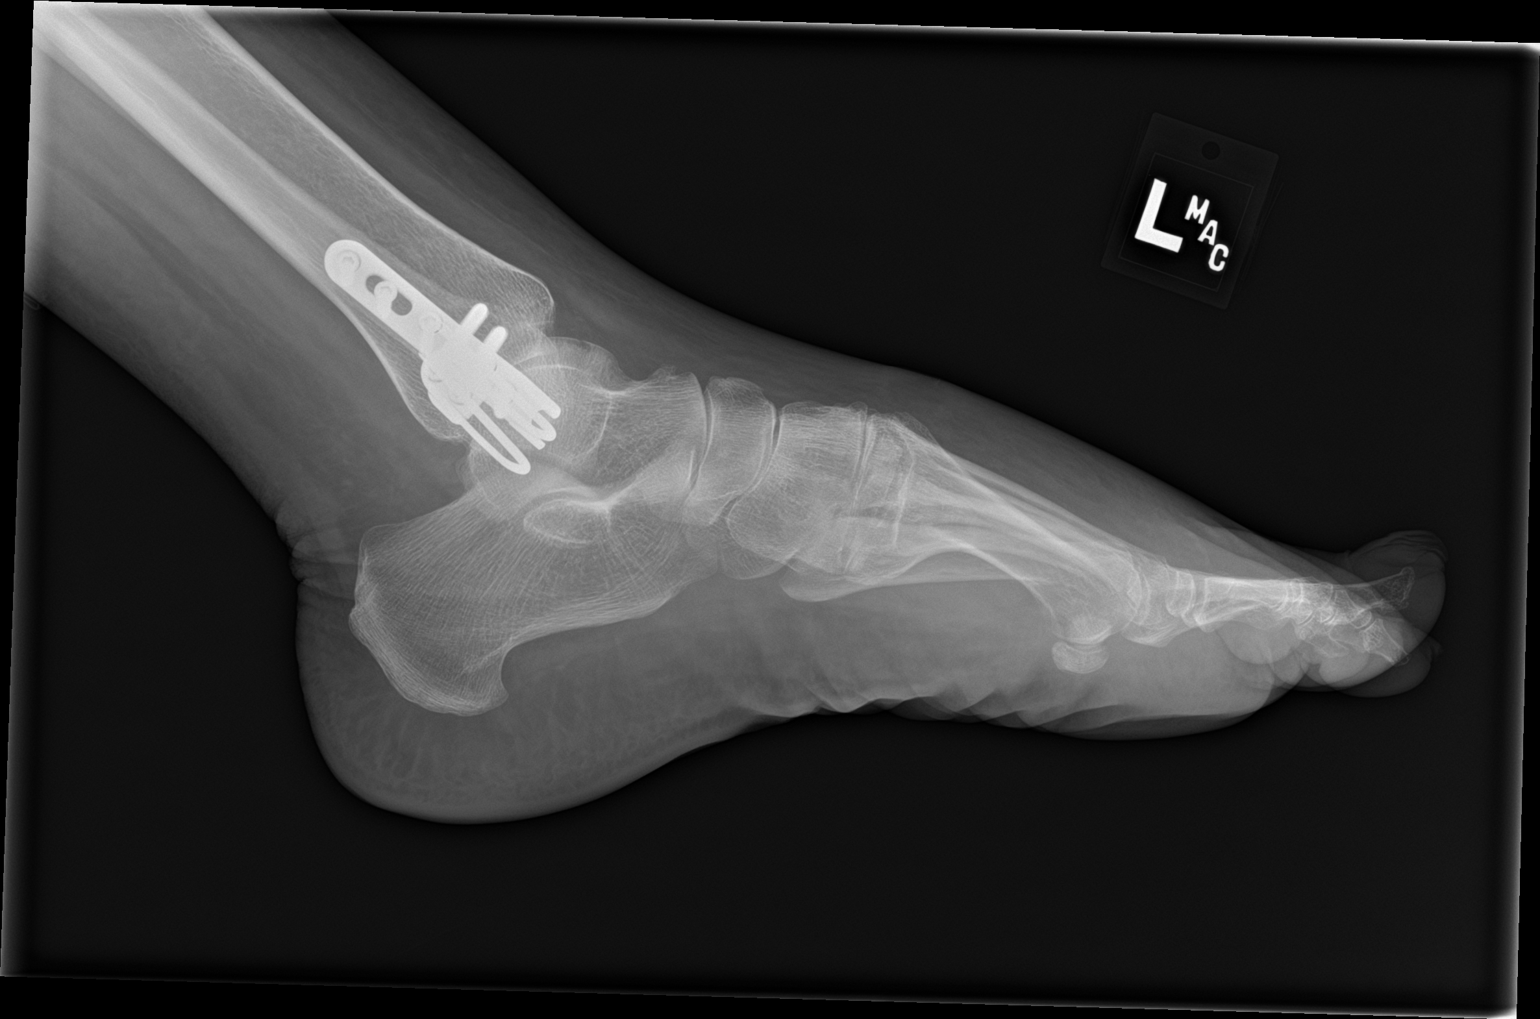

[3 of 3 positions shown; findings below may reference images not displayed]

FINDINGS: Clinical wound is not well seen radiographically. There is dorsal
soft tissue edema. No evidence of soft tissue air or radiopaque
foreign body. No abnormal bone density, periosteal reaction, or bony
destructive change to suggest osteomyelitis. Degenerative change at
the first tarsal metatarsal joint. Postsurgical change in the ankle
medial and lateral malleoli with intact hardware.
IMPRESSION: Dorsal soft tissue edema without soft tissue air or radiopaque
foreign body. Exact site of wound is not well visualized
radiographically.

No acute osseous abnormality.

## 2017-07-01 MED ORDER — KETOROLAC TROMETHAMINE 15 MG/ML IJ SOLN
15.0000 mg | Freq: Once | INTRAMUSCULAR | Status: DC
  Filled 2017-07-01: qty 1

## 2017-07-01 MED ORDER — KETOROLAC TROMETHAMINE 30 MG/ML IJ SOLN
30.0000 mg | Freq: Once | INTRAMUSCULAR | Status: AC
  Administered 2017-07-01: 30 mg via INTRAMUSCULAR
  Filled 2017-07-01: qty 1

## 2017-07-01 MED ORDER — METOCLOPRAMIDE HCL 5 MG/ML IJ SOLN: 10.0000 mg | Freq: Once | INTRAMUSCULAR | Status: DC

## 2017-07-01 NOTE — ED Triage Notes (Signed)
Patient states that she has had a "headache" for the last 3 days - she is unable to sleep due to the pain - Reports a lot of stress recently. The patient states that hte pain is to her forehead and right side of her face

## 2017-07-01 NOTE — ED Provider Notes (Signed)
[facility] Provider Note: Georgena Spurling, MD, FACEP  CSN: [id number] MRN: [id number] ARRIVAL: 07/01/17 at [place]: [place]  Facial Pain   HISTORY OF PRESENT ILLNESS  07/01/17 4:47 AM Zoe Clay is a 42 y.o. female with a 2-day history of a headache.  The headache is located on the right side of the head and face.  She describes it as sharp and throbbing in nature and the onset was gradual.  She rates it as a 9 out of 10 in severity.  She has taken Tylenol without relief.  She denies associated nausea, vomiting, visual changes or focal neurologic changes.  She states she has been under stress recently.  She does have a history of psychiatric illness.  She is diabetic and has a wound on her dorsal left foot that is been present for about 3 weeks.  There is mild to moderate pain associated with this as well as some drainage.  She is not what caused this wound to originate.   Past Medical History:  Diagnosis Date  . Bipolar 1 disorder ([place])   . Diabetes mellitus   . High cholesterol   . Hypertension   . Peripheral neuropathy   . Schizophrenia, acute ([place])   . Seizures ([place])    last seizure May 2016  . Thyroid disease     Past Surgical History:  Procedure Laterality Date  . ANKLE SURGERY    . TUBAL LIGATION      Family History  Problem Relation Age of Onset  . Diabetes Father   . Cancer Maternal Grandfather     Social History   Tobacco Use  . Smoking status: Current Every Day Smoker    Packs/day: 0.50    Types: Cigarettes  . Smokeless tobacco: Never Used  Substance Use Topics  . Alcohol use: No  . Drug use: No    Prior to Admission medications   Medication Sig Start Date End Date Taking? Authorizing Provider  busPIRone (BUSPAR) 5 MG tablet Take 5 mg by mouth 2 (two) times daily.    [provider]  clindamycin (CLEOCIN) 150 MG capsule Take 3 capsules (450 mg total) by mouth 3 (three) times daily. 04/11/17    Petrucelli, Samantha R, PA-C  DULoxetine (CYMBALTA) 60 MG capsule Take 60 mg by mouth daily.    [provider]  gabapentin (NEURONTIN) 600 MG tablet Take 1 tablet (600 mg total) by mouth 2 (two) times daily. 01/10/15   Katheren Shams, DO  hydrOXYzine (ATARAX/VISTARIL) 25 MG tablet Take 1 tablet (25 mg total) by mouth every 6 (six) hours. 12/13/15   Joy, Shawn C, PA-C  hydrOXYzine (ATARAX/VISTARIL) 25 MG tablet Take 1 tablet (25 mg total) by mouth every 8 (eight) hours as needed for anxiety. 08/21/16   Katheren Shams, DO  insulin aspart (NOVOLOG) 100 UNIT/ML injection Inject into the skin 3 (three) times daily before meals.    [provider]  insulin glargine (LANTUS) 100 UNIT/ML injection Inject 15 Units into the skin daily. Patient taking differently: Inject 20 Units into the skin daily.  06/22/12   Regalado, Belkys A, MD  levothyroxine (SYNTHROID, LEVOTHROID) 125 MCG tablet Take 125 mcg by mouth daily.      [provider]  lisinopril (PRINIVIL,ZESTRIL) 5 MG tablet Take 5 mg by mouth daily.    [provider]  naproxen (NAPROSYN) 500 MG tablet Take 1 tablet (500 mg total) by mouth 2 (two) times daily. 04/11/17  Petrucelli, Samantha R, PA-C  phenytoin (DILANTIN) 100 MG ER capsule Take 200 mg by mouth 2 (two) times daily.     [provider]  phenytoin (DILANTIN) 200 MG ER capsule Take 1 capsule (200 mg total) by mouth 2 (two) times daily. 08/21/16 08/31/16  Katheren Shams, DO  risperiDONE (RISPERDAL) 2 MG tablet Take 2 mg by mouth 2 (two) times daily.    [provider]  risperiDONE (RISPERDAL) 2 MG tablet Take 1 tablet (2 mg total) by mouth 2 (two) times daily. 08/21/16 08/31/16  Katheren Shams, DO  simvastatin (ZOCOR) 20 MG tablet Take 20 mg by mouth every evening.    [provider]  traZODone (DESYREL) 100 MG tablet Take 100 mg by mouth at bedtime.    [provider]  traZODone (DESYREL) 100 MG tablet Take 1 tablet (100 mg  total) by mouth at bedtime. 08/21/16   Katheren Shams, DO    Allergies Iodine and Iohexol   REVIEW OF SYSTEMS  Negative except as noted here or in the History of Present Illness.   PHYSICAL EXAMINATION  Initial Vital Signs Blood pressure (!) 115/99, pulse 99, temperature 98.4 F (36.9 C), temperature source Oral, resp. rate 18, height 5\' 5"  (1.651 m), weight 65.8 kg (145 lb), last menstrual period 06/17/2017, SpO2 100 %.  Examination General: Well-developed, well-nourished female in no acute distress; appearance consistent with age of record HENT: normocephalic; atraumatic; mild tenderness of right temporal area Eyes: pupils equal, round and reactive to light; extraocular muscles intact Neck: supple Heart: regular rate and rhythm Lungs: clear to auscultation bilaterally Abdomen: soft; nondistended; nontender; bowel sounds present Extremities: No deformity; full range of motion; pulses normal; trace edema of lower legs and feet Neurologic: Awake, alert and oriented; motor function intact in all extremities and symmetric; no facial droop Skin: Warm and dry; chronic appearing wound to dorsal left foot without signs of infection:  Psychiatric: Normal mood and affect   RESULTS  Summary of this visit's results, reviewed by myself:   EKG Interpretation  Date/Time:    Ventricular Rate:    PR Interval:    QRS Duration:   QT Interval:    QTC Calculation:   R Axis:     Text Interpretation:        Laboratory Studies: No results found for this or any previous visit (from the past 24 hour(s)). Imaging Studies: Dg Foot Complete Left  Result Date: 07/01/2017 CLINICAL DATA:  Open wound and dorsal aspect of foot between first and second metatarsals for 3 weeks. Pain. EXAM: LEFT FOOT - COMPLETE 3+ VIEW COMPARISON:  None. FINDINGS: Clinical wound is not well seen radiographically. There is dorsal soft tissue edema. No evidence of soft tissue air or radiopaque foreign body. No abnormal  bone density, periosteal reaction, or bony destructive change to suggest osteomyelitis. Degenerative change at the first tarsal metatarsal joint. Postsurgical change in the ankle medial and lateral malleoli with intact hardware. IMPRESSION: Dorsal soft tissue edema without soft tissue air or radiopaque foreign body. Exact site of wound is not well visualized radiographically. No acute osseous abnormality. Electronically Signed   By: Jeb Levering M.D.   On: 07/01/2017 04:48    ED COURSE  Nursing notes and initial vitals signs, including pulse oximetry, reviewed.  Vitals:   07/01/17 0045 07/01/17 0520  BP: (!) 115/99 110/78  Pulse: 99 79  Resp: 18 16  Temp: 98.4 F (36.9 C)   TempSrc: Oral   SpO2: 100% 100%  Weight: 65.8 kg (145 lb)   Height: 5\' 5"  (1.651 m)    5:57 AM Headache improved after IM Toradol.  Patient ready to go home.  Her foot wound has been dressed.  She has a primary care physician with whom she will follow-up.  PROCEDURES    ED DIAGNOSES     ICD-10-CM   1. Bad headache R51   2. Wound of left foot S91.302A        Jance Siek, Jenny Reichmann, MD 07/01/17 [phone]

## 2017-07-01 NOTE — ED Notes (Signed)
Pt c/o right side facial pain, and has been seen in the last three months for similar pain r/t dental decay.  She tried one dose of ibuprofen and one dose of tylenol without relief from the pain.   Pt also c/o a sore to the top of her left foot that has been there for three weeks.  It is an open wound about the size of a quarter, with surrounding swelling and pain.  Pt states "someone" put her on amoxicillin, but that was not effective.  Pt is also wearing work clogs on her feet without socks, and it appears that the shoe has been rubbing the wound.  Pt states she can barely put a shoe on and it is very painful to walk.

## 2017-07-16 ENCOUNTER — Encounter (HOSPITAL_BASED_OUTPATIENT_CLINIC_OR_DEPARTMENT_OTHER): Payer: Self-pay | Admitting: Emergency Medicine

## 2017-07-16 ENCOUNTER — Other Ambulatory Visit: Payer: Self-pay

## 2017-07-16 ENCOUNTER — Emergency Department (HOSPITAL_BASED_OUTPATIENT_CLINIC_OR_DEPARTMENT_OTHER)
Admission: EM | Admit: 2017-07-16 | Discharge: 2017-07-16 | Disposition: A | Discharge status: Home / Self Care | Payer: Self-pay | Attending: Physician Assistant | Admitting: Physician Assistant

## 2017-07-16 DIAGNOSIS — E1140 Type 2 diabetes mellitus with diabetic neuropathy, unspecified: Secondary | ICD-10-CM | POA: Insufficient documentation

## 2017-07-16 DIAGNOSIS — Z79899 Other long term (current) drug therapy: Secondary | ICD-10-CM | POA: Insufficient documentation

## 2017-07-16 DIAGNOSIS — E039 Hypothyroidism, unspecified: Secondary | ICD-10-CM | POA: Insufficient documentation

## 2017-07-16 DIAGNOSIS — R5383 Other fatigue: Secondary | ICD-10-CM | POA: Insufficient documentation

## 2017-07-16 DIAGNOSIS — Z794 Long term (current) use of insulin: Secondary | ICD-10-CM | POA: Insufficient documentation

## 2017-07-16 DIAGNOSIS — F17210 Nicotine dependence, cigarettes, uncomplicated: Secondary | ICD-10-CM | POA: Insufficient documentation

## 2017-07-16 DIAGNOSIS — I10 Essential (primary) hypertension: Secondary | ICD-10-CM | POA: Insufficient documentation

## 2017-07-16 LAB — URINALYSIS, ROUTINE W REFLEX MICROSCOPIC
Bilirubin Urine: NEGATIVE
Ketones, ur: NEGATIVE mg/dL
LEUKOCYTES UA: NEGATIVE
Nitrite: NEGATIVE
Protein, ur: NEGATIVE mg/dL
SPECIFIC GRAVITY, URINE: 1.02 (ref 1.005–1.030)
pH: 5.5 (ref 5.0–8.0)

## 2017-07-16 LAB — CBG MONITORING, ED
GLUCOSE-CAPILLARY: 111 mg/dL — AB (ref 65–99)
GLUCOSE-CAPILLARY: 112 mg/dL — AB (ref 65–99)

## 2017-07-16 LAB — CBC WITH DIFFERENTIAL/PLATELET
BASOS ABS: 0 10*3/uL (ref 0.0–0.1)
BASOS PCT: 0 %
EOS ABS: 0.1 10*3/uL (ref 0.0–0.7)
EOS PCT: 1 %
HCT: 34.9 % — ABNORMAL LOW (ref 36.0–46.0)
HEMOGLOBIN: 12 g/dL (ref 12.0–15.0)
Lymphocytes Relative: 48 %
Lymphs Abs: 2.6 10*3/uL (ref 0.7–4.0)
MCH: 31.1 pg (ref 26.0–34.0)
MCHC: 34.4 g/dL (ref 30.0–36.0)
MCV: 90.4 fL (ref 78.0–100.0)
Monocytes Absolute: 0.4 10*3/uL (ref 0.1–1.0)
Monocytes Relative: 7 %
NEUTROS PCT: 44 %
Neutro Abs: 2.4 10*3/uL (ref 1.7–7.7)
PLATELETS: 357 10*3/uL (ref 150–400)
RBC: 3.86 MIL/uL — AB (ref 3.87–5.11)
RDW: 13 % (ref 11.5–15.5)
WBC: 5.5 10*3/uL (ref 4.0–10.5)

## 2017-07-16 LAB — COMPREHENSIVE METABOLIC PANEL
ALT: 15 U/L (ref 14–54)
AST: 19 U/L (ref 15–41)
Albumin: 4 g/dL (ref 3.5–5.0)
Alkaline Phosphatase: 80 U/L (ref 38–126)
Anion gap: 9 (ref 5–15)
BILIRUBIN TOTAL: 0.5 mg/dL (ref 0.3–1.2)
BUN: 14 mg/dL (ref 6–20)
CALCIUM: 9.1 mg/dL (ref 8.9–10.3)
CO2: 26 mmol/L (ref 22–32)
CREATININE: 0.64 mg/dL (ref 0.44–1.00)
Chloride: 100 mmol/L — ABNORMAL LOW (ref 101–111)
Glucose, Bld: 91 mg/dL (ref 65–99)
Potassium: 3.1 mmol/L — ABNORMAL LOW (ref 3.5–5.1)
Sodium: 135 mmol/L (ref 135–145)
TOTAL PROTEIN: 7.6 g/dL (ref 6.5–8.1)

## 2017-07-16 LAB — URINALYSIS, MICROSCOPIC (REFLEX): WBC, UA: NONE SEEN WBC/hpf (ref 0–5)

## 2017-07-16 LAB — AMMONIA: AMMONIA: 16 umol/L (ref 9–35)

## 2017-07-16 LAB — PREGNANCY, URINE: PREG TEST UR: NEGATIVE

## 2017-07-16 NOTE — ED Triage Notes (Signed)
Pt reports Body aches and some blurry vision since yesterday that she attributes to elevated blood sugar.  Sts she has been working 16 hour shifts all week and she feels bad.  Sts her blood sugar was 236 yesterday, and it normally runs about 189.

## 2017-07-16 NOTE — Discharge Instructions (Signed)
We are unsure what is causing your fatigue.  However we are glad to report that your labs and vital signs and physical exam are all reassuring.  We want you to go home and rest for several days.  We will give you couple days off work.  Be sure to stay hydrated.  Be sure to continue to check your sugars and follow-up with your primary care physician in the next 24-48 hours.

## 2017-07-16 NOTE — ED Provider Notes (Signed)
[name]   CSN: [id number] Arrival date & time: 07/16/17  2706     History   Chief Complaint Chief Complaint  Patient presents with  . Blood Sugar Problem    HPI Zoe Clay is a 42 y.o. female.  HPI   Patient is a 42 year old female with past medical history significant for bipolar disorder, diabetes hypertension hyperlipidemia schizophrenia seizures.  Patient is here today with fatigue.  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  However her sugars not been that high.  Patient reports she can barely keep her eyes open, increased fatigue.  No focal symptoms such as chest pain shortness of breath fever vomiting diarrhea  Past Medical History:  Diagnosis Date  . Bipolar 1 disorder ([place])   . Diabetes mellitus   . High cholesterol   . Hypertension   . Peripheral neuropathy   . Schizophrenia, acute ([place])   . Seizures ([place])    last seizure May 2016  . Thyroid disease     Patient Active Problem List   Diagnosis Date Noted  . DKA (diabetic ketoacidoses) ([place]) 06/20/2012  . Nausea with vomiting 06/20/2012  . 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 Seizure disorder ([place]) 06/20/2012  . Unspecified hypothyroidism 06/20/2012  . Hyponatremia 06/20/2012    Past Surgical History:  Procedure Laterality Date  . ANKLE SURGERY    . TUBAL LIGATION       OB History   None      Home Medications    Prior to Admission medications   Medication Sig Start Date End Date Taking? Authorizing Provider  busPIRone (BUSPAR) 5 MG tablet Take 5 mg by mouth 2 (two) times daily.    [provider]  clindamycin (CLEOCIN) 150 MG capsule Take 3 capsules (450 mg total) by mouth 3 (three) times daily. 04/11/17   Petrucelli, Samantha R, PA-C    DULoxetine (CYMBALTA) 60 MG capsule Take 60 mg by mouth daily.    [provider]  gabapentin (NEURONTIN) 600 MG tablet Take 1 tablet (600 mg total) by mouth 2 (two) times daily. 01/10/15   Katheren Shams, DO  hydrOXYzine (ATARAX/VISTARIL) 25 MG tablet Take 1 tablet (25 mg total) by mouth every 6 (six) hours. 12/13/15   Joy, Shawn C, PA-C  hydrOXYzine (ATARAX/VISTARIL) 25 MG tablet Take 1 tablet (25 mg total) by mouth every 8 (eight) hours as needed for anxiety. 08/21/16   Katheren Shams, DO  insulin aspart (NOVOLOG) 100 UNIT/ML injection Inject into the skin 3 (three) times daily before meals.    [provider]  insulin glargine (LANTUS) 100 UNIT/ML injection Inject 15 Units into the skin daily. Patient taking differently: Inject 20 Units into the skin daily.  06/22/12   Regalado, Belkys A, MD  levothyroxine (SYNTHROID, LEVOTHROID) 125 MCG tablet Take 125 mcg by mouth daily.      [provider]  lisinopril (PRINIVIL,ZESTRIL) 5 MG tablet Take 5 mg by mouth daily.    [provider]  naproxen (NAPROSYN) 500 MG tablet Take 1 tablet (500 mg total) by mouth 2 (two) times daily. 04/11/17   Petrucelli, Glynda Jaeger, PA-C  phenytoin (DILANTIN) 100 MG ER capsule Take 200 mg by mouth 2 (two) times daily.     [provider]  phenytoin (DILANTIN) 200 MG ER capsule Take 1 capsule (200 mg total) by mouth 2 (two) times daily. 08/21/16 08/31/16  Katheren Shams, DO  risperiDONE (RISPERDAL) 2 MG tablet Take 2 mg by mouth 2 (two) times daily.    [provider]  risperiDONE (RISPERDAL) 2 MG tablet Take 1 tablet (2 mg total) by mouth 2 (two) times daily. 08/21/16 08/31/16  Katheren Shams, DO  simvastatin (ZOCOR) 20 MG tablet Take 20 mg by mouth every evening.    [provider]  traZODone (DESYREL) 100 MG tablet Take 100 mg by mouth at bedtime.    [provider]  traZODone (DESYREL) 100 MG tablet Take 1 tablet (100 mg total) by mouth at bedtime.  08/21/16   Katheren Shams, DO    Family History Family History  Problem Relation Age of Onset  . Diabetes Father   . Cancer Maternal Grandfather     Social History Social History   Tobacco Use  . Smoking status: Current Every Day Smoker    Packs/day: 0.25    Types: Cigarettes  . Smokeless tobacco: Never Used  Substance Use Topics  . Alcohol use: No  . Drug use: No     Allergies   Iodine and Iohexol   Review of Systems Review of Systems  Constitutional: Positive for fatigue. Negative for activity change.  Respiratory: Negative for shortness of breath.   Cardiovascular: Negative for chest pain.  Gastrointestinal: Negative for abdominal pain.  All other systems reviewed and are negative.    Physical Exam Updated Vital Signs BP 120/83 (BP Location: Right Arm)   Pulse 87   Temp 97.6 F (36.4 C) (Oral)   Resp 16   Ht 5\' 5"  (1.651 m)   Wt 65.8 kg (145 lb)   LMP 06/17/2017   SpO2 100%   BMI 24.13 kg/m   Physical Exam  Constitutional: She is oriented to person, place, and time. She appears well-developed and well-nourished.  Patient does appear fatigued.  HENT:  Head: Normocephalic and atraumatic.  Eyes: Right eye exhibits no discharge. Left eye exhibits no discharge.  Cardiovascular: Normal rate and regular rhythm.  Pulmonary/Chest: Effort normal and breath sounds normal. No respiratory distress.  Abdominal: Soft. She exhibits no distension. There is no tenderness.  Neurological: She is oriented to person, place, and time.  Skin: Skin is warm and dry. She is not diaphoretic.  Psychiatric: She has a normal mood and affect.  Nursing note and vitals reviewed.    ED Treatments / Results  Labs (all labs ordered are listed, but only abnormal results are displayed) Labs Reviewed  URINALYSIS, ROUTINE W REFLEX MICROSCOPIC - Abnormal; Notable for the following components:      Result Value   Glucose, UA >=500 (*)    Hgb urine dipstick TRACE (*)    All other  components within normal limits  URINALYSIS, MICROSCOPIC (REFLEX) - Abnormal; Notable for the following components:   Bacteria, UA RARE (*)    Squamous Epithelial / LPF 0-5 (*)    All other components within normal limits  CBG MONITORING, ED - Abnormal; Notable for the following components:   Glucose-Capillary 111 (*)    All other components within normal limits  PREGNANCY, URINE    EKG None  Radiology No results found.  Procedures Procedures (including critical care time)  Medications Ordered in ED Medications - No data to display   Initial Impression / Assessment and Plan / ED Course  I have reviewed the triage  vital signs and the nursing notes.  Pertinent labs & imaging results that were available during my care of the patient were reviewed by me and considered in my medical decision making (see chart for details).    Patient is a 42 year old female with past medical history significant for bipolar disorder, diabetes hypertension hyperlipidemia schizophrenia seizures.  Patient is here today with fatigue.  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  However her sugars not been that high.  Patient reports she can barely keep her eyes open, increased fatigue.  No focal symptoms such as chest pain shortness of breath fever vomiting diarrhea   10:35 AM Emergency Ultrasound:  US Guidance for needle guidance  Performed by Dr. Thomasene Lot Indication: IV access Linear probe used in real-time to visualize location of needle entry through skin. Interpretation: IV inserted into peripheral vein. Images not archived electronically.   We will get baseline lab work.  Is possible patient is just fatigued from not sleeping, overworking.  Will make sure that patient lab work is okay and then plan to discharge home with instructions to sleep.   12:32  PM Lab work all reassuring.  Vital signs normal.  Patient is eating and drinking and physical exam is reassuring.  In the absence of the abnormalities, likely that this fatigue is just from overworking.  We will have her rest, stay hydrated, check her sugars, follow-up with primary care.  Final Clinical Impressions(s) / ED Diagnoses   Final diagnoses:  None    ED Discharge Orders    None       Macarthur Critchley, MD 07/16/17 1232

## 2017-09-16 ENCOUNTER — Other Ambulatory Visit: Payer: Self-pay

## 2017-09-16 ENCOUNTER — Emergency Department (HOSPITAL_BASED_OUTPATIENT_CLINIC_OR_DEPARTMENT_OTHER)
Admission: EM | Admit: 2017-09-16 | Discharge: 2017-09-16 | Disposition: A | Discharge status: Home / Self Care | Payer: Self-pay | Attending: Emergency Medicine | Admitting: Emergency Medicine

## 2017-09-16 ENCOUNTER — Encounter (HOSPITAL_BASED_OUTPATIENT_CLINIC_OR_DEPARTMENT_OTHER): Payer: Self-pay

## 2017-09-16 DIAGNOSIS — L0231 Cutaneous abscess of buttock: Secondary | ICD-10-CM | POA: Insufficient documentation

## 2017-09-16 DIAGNOSIS — F209 Schizophrenia, unspecified: Secondary | ICD-10-CM | POA: Insufficient documentation

## 2017-09-16 DIAGNOSIS — Z794 Long term (current) use of insulin: Secondary | ICD-10-CM | POA: Insufficient documentation

## 2017-09-16 DIAGNOSIS — E109 Type 1 diabetes mellitus without complications: Secondary | ICD-10-CM | POA: Insufficient documentation

## 2017-09-16 DIAGNOSIS — F319 Bipolar disorder, unspecified: Secondary | ICD-10-CM | POA: Insufficient documentation

## 2017-09-16 DIAGNOSIS — F17210 Nicotine dependence, cigarettes, uncomplicated: Secondary | ICD-10-CM | POA: Insufficient documentation

## 2017-09-16 DIAGNOSIS — Z79899 Other long term (current) drug therapy: Secondary | ICD-10-CM | POA: Insufficient documentation

## 2017-09-16 DIAGNOSIS — I10 Essential (primary) hypertension: Secondary | ICD-10-CM | POA: Insufficient documentation

## 2017-09-16 MED ORDER — LIDOCAINE-EPINEPHRINE (PF) 2 %-1:200000 IJ SOLN
10.0000 mL | Freq: Once | INTRAMUSCULAR | Status: AC
  Administered 2017-09-16: 10 mL via INTRADERMAL
  Filled 2017-09-16 (×2): qty 10

## 2017-09-16 MED ORDER — CLINDAMYCIN HCL 300 MG PO CAPS: 300.0000 mg | ORAL_CAPSULE | Freq: Four times a day (QID) | ORAL | 0 refills | Status: AC

## 2017-09-16 NOTE — ED Provider Notes (Addendum)
[facility] EMERGENCY DEPARTMENT Provider Note  CSN: [id number] Arrival date & time: 09/16/17  1138   History   Chief Complaint Chief Complaint  Patient presents with  . Abscess   HPI Zoe Clay is a 42 y.o. female with a medical history of Type 1 DM, hypothyroidism and seizures who presented to the ED for buttock abscess. Patient states that she has had a boil on her left buttock x 4 days which makes it painful to sit. She states she had one 4 years ago that she had drained. Denies fever, chills, warmth, rashes, abdominal pain, changes in urinary or bowel habits, hematochezia or melena.  Denies recent anal intercourse, trauma or injury. She has tried warm soaks to help alleviate discomfort.  Past Medical History:  Diagnosis Date  . Bipolar 1 disorder ([place])   . Diabetes mellitus   . High cholesterol   . Hypertension   . Peripheral neuropathy   . Schizophrenia, acute ([place])   . Seizures ([place])    last seizure May 2016  . Thyroid disease     Patient Active Problem List   Diagnosis Date Noted  . DKA (diabetic ketoacidoses) ([place]) 06/20/2012  . Nausea with vomiting 06/20/2012  . 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 Seizure disorder ([place]) 06/20/2012  . Unspecified hypothyroidism 06/20/2012  . Hyponatremia 06/20/2012    Past Surgical History:  Procedure Laterality Date  . ANKLE SURGERY    . TUBAL LIGATION       OB History   None      Home Medications    Prior to Admission medications   Medication Sig Start Date End Date Taking? Authorizing Provider  busPIRone (BUSPAR) 5 MG tablet Take 5 mg by mouth 2 (two) times daily.    [provider]  clindamycin (CLEOCIN) 300 MG capsule Take 1 capsule (300 mg total) by mouth 4 (four) times daily for 7 days. 09/16/17 09/23/17  Mortis, Alvie [place]idelberg I, PA-C  DULoxetine (CYMBALTA) 60 MG capsule Take 60 mg by mouth daily.    [provider]  gabapentin  (NEURONTIN) 600 MG tablet Take 1 tablet (600 mg total) by mouth 2 (two) times daily. 01/10/15   Katheren Shams, DO  hydrOXYzine (ATARAX/VISTARIL) 25 MG tablet Take 1 tablet (25 mg total) by mouth every 6 (six) hours. 12/13/15   Joy, Shawn C, PA-C  hydrOXYzine (ATARAX/VISTARIL) 25 MG tablet Take 1 tablet (25 mg total) by mouth every 8 (eight) hours as needed for anxiety. 08/21/16   Katheren Shams, DO  insulin aspart (NOVOLOG) 100 UNIT/ML injection Inject into the skin 3 (three) times daily before meals.    [provider]  insulin glargine (LANTUS) 100 UNIT/ML injection Inject 15 Units into the skin daily. Patient taking differently: Inject 20 Units into the skin daily.  06/22/12   Regalado, Belkys A, MD  levothyroxine (SYNTHROID, LEVOTHROID) 125 MCG tablet Take 125 mcg by mouth daily.      [provider]  lisinopril (PRINIVIL,ZESTRIL) 5 MG tablet Take 5 mg by mouth daily.    [provider]  naproxen (NAPROSYN) 500 MG tablet Take 1 tablet (500 mg total) by mouth 2 (two) times daily. 04/11/17   Petrucelli, Glynda Jaeger, PA-C  phenytoin (DILANTIN) 100 MG ER capsule Take 200 mg by mouth 2 (two) times daily.     [provider]  phenytoin (DILANTIN) 200 MG ER capsule Take 1 capsule (200 mg total) by mouth 2 (two) times daily.  08/21/16 08/31/16  Katheren Shams, DO  risperiDONE (RISPERDAL) 2 MG tablet Take 2 mg by mouth 2 (two) times daily.    [provider]  risperiDONE (RISPERDAL) 2 MG tablet Take 1 tablet (2 mg total) by mouth 2 (two) times daily. 08/21/16 08/31/16  Katheren Shams, DO  simvastatin (ZOCOR) 20 MG tablet Take 20 mg by mouth every evening.    [provider]  traZODone (DESYREL) 100 MG tablet Take 100 mg by mouth at bedtime.    [provider]  traZODone (DESYREL) 100 MG tablet Take 1 tablet (100 mg total) by mouth at bedtime. 08/21/16   Katheren Shams, DO    Family History Family History  Problem Relation Age of Onset  .  Diabetes Father   . Cancer Maternal Grandfather     Social History Social History   Tobacco Use  . Smoking status: Current Every Day Smoker    Packs/day: 0.25    Types: Cigarettes  . Smokeless tobacco: Never Used  Substance Use Topics  . Alcohol use: No  . Drug use: No     Allergies   Iodine and Iohexol   Review of Systems Review of Systems  Constitutional: Negative for appetite change, chills, diaphoresis and fever.  Gastrointestinal: Negative for abdominal pain, anal bleeding, blood in stool, constipation, diarrhea, nausea, rectal pain and vomiting.  Endocrine: Negative.   Genitourinary: Negative.  Negative for difficulty urinating and dysuria.  Musculoskeletal: Negative for arthralgias.  Skin: Positive for wound.     Physical Exam Updated Vital Signs BP 102/77 (BP Location: Right Arm)   Pulse 87   Temp 98.2 F (36.8 C) (Oral)   Resp 18   Ht 5\' 5"  (1.651 m)   Wt 64.3 kg (141 lb 12.1 oz)   LMP 09/08/2017   SpO2 100%   BMI 23.59 kg/m   Physical Exam  Constitutional: She appears well-developed and well-nourished. No distress.  Abdominal: Soft. Bowel sounds are normal.  Genitourinary: Rectum normal. Rectal exam shows no tenderness and anal tone normal.     Genitourinary Comments: 5 cm fluctuant abscess on left buttock. No warmth or surrounding erythema. Tender to palpation.  Lymphadenopathy: No inguinal adenopathy noted on the right or left side.  Skin: Skin is warm, dry and intact. No rash noted. She is not diaphoretic.  5cm abscess on left buttock.  Nursing note and vitals reviewed.    ED Treatments / Results  Labs (all labs ordered are listed, but only abnormal results are displayed) Labs Reviewed - No data to display  EKG None  Radiology No results found.  Procedures .Marland KitchenIncision and Drainage Date/Time: 11/15/2017 7:30 PM Performed by: Romie Jumper, PA-C Authorized by: Romie Jumper, PA-C   Consent:    Consent obtained:   Verbal   Consent given by:  Patient   Risks discussed:  Bleeding, incomplete drainage, pain, infection and damage to other organs   Alternatives discussed:  No treatment Location:    Type:  Abscess   Size:  5cm   Location:  Lower extremity   Lower extremity location:  Buttock   Buttock location:  L buttock Pre-procedure details:    Skin preparation:  Betadine Anesthesia (see MAR for exact dosages):    Anesthesia method:  Local infiltration   Local anesthetic:  Lidocaine 2% WITH epi Procedure type:    Complexity:  Simple Procedure details:    Incision types:  Single straight   Incision depth:  Subcutaneous   Scalpel blade:  11   Wound management:  Probed and deloculated, irrigated with saline and extensive cleaning   Drainage:  Purulent   Drainage amount:  Copious   Wound treatment:  Wound left open   Packing materials:  None Post-procedure details:    Patient tolerance of procedure:  Tolerated well, no immediate complications   (including critical care time)  Medications Ordered in ED Medications  lidocaine-EPINEPHrine (XYLOCAINE W/EPI) 2 %-1:200000 (PF) injection 10 mL (10 mLs Intradermal Given 09/16/17 1235)     Initial Impression / Assessment and Plan / ED Course  Triage vital signs and the nursing notes have been reviewed.  Pertinent labs & imaging results that were available during care of the patient were reviewed and considered in medical decision making (see chart for details).   Patient presents in no acute distress. No systemic s/s to suggest a widespread infection or intra-abdominal process that needs to be evaluated. Simple left buttock abscess visualized on exam without surrounding erythema or warmth to suggest cellulitis. Abscess was fluctuant and not near anorectal area. Normal rectal exam. I&D performed without complications. Education provided on appropriate cleaning and outpatient follow-up for wound checks. Follow-up with primary care for wound check in 2  days to remove packing.  Final Clinical Impressions(s) / ED Diagnoses  1. Buttock Abscess. I&D performed without complications. With packing. Prophylactic antibiotics prescribed. Clindamycin 300mg  TID x7 days. Education provided on cleaning and keeping blood sugar under control to prevent future infections.  Dispo: Home. After thorough clinical evaluation, this patient is determined to be medically stable and can be safely discharged with the previously mentioned treatment and/or outpatient follow-up/referral(s). At this time, there are no other apparent medical conditions that require further screening, evaluation or treatment.   Final diagnoses:  Abscess of buttock, left    ED Discharge Orders        Ordered    clindamycin (CLEOCIN) 300 MG capsule  4 times daily     09/16/17 [address] [place] 09/16/17 1429    Quintella Reichert, MD 09/16/17 1555    [address] [place] I, PA-C 11/15/17 1931    Quintella Reichert, MD 11/16/17 [phone]

## 2017-09-16 NOTE — Discharge Instructions (Signed)
Follow-up with primary care on Friday to get packing removed and for wound check.

## 2017-09-16 NOTE — ED Notes (Signed)
NAD at this time. Pt is stable and going home.  

## 2017-09-16 NOTE — ED Triage Notes (Signed)
C/o abscess to left buttock x 6 days-NAD-steady slow gait

## 2017-11-08 ENCOUNTER — Emergency Department (HOSPITAL_BASED_OUTPATIENT_CLINIC_OR_DEPARTMENT_OTHER): Payer: Self-pay

## 2017-11-08 ENCOUNTER — Encounter (HOSPITAL_BASED_OUTPATIENT_CLINIC_OR_DEPARTMENT_OTHER): Payer: Self-pay | Admitting: *Deleted

## 2017-11-08 ENCOUNTER — Emergency Department (HOSPITAL_BASED_OUTPATIENT_CLINIC_OR_DEPARTMENT_OTHER)
Admission: EM | Admit: 2017-11-08 | Discharge: 2017-11-08 | Disposition: A | Discharge status: Home / Self Care | Payer: Self-pay | Attending: Emergency Medicine | Admitting: Emergency Medicine

## 2017-11-08 ENCOUNTER — Other Ambulatory Visit: Payer: Self-pay

## 2017-11-08 DIAGNOSIS — E1040 Type 1 diabetes mellitus with diabetic neuropathy, unspecified: Secondary | ICD-10-CM | POA: Insufficient documentation

## 2017-11-08 DIAGNOSIS — I10 Essential (primary) hypertension: Secondary | ICD-10-CM | POA: Insufficient documentation

## 2017-11-08 DIAGNOSIS — Z79899 Other long term (current) drug therapy: Secondary | ICD-10-CM | POA: Insufficient documentation

## 2017-11-08 DIAGNOSIS — E7800 Pure hypercholesterolemia, unspecified: Secondary | ICD-10-CM | POA: Insufficient documentation

## 2017-11-08 DIAGNOSIS — Z794 Long term (current) use of insulin: Secondary | ICD-10-CM | POA: Insufficient documentation

## 2017-11-08 DIAGNOSIS — M79602 Pain in left arm: Secondary | ICD-10-CM | POA: Insufficient documentation

## 2017-11-08 DIAGNOSIS — F17200 Nicotine dependence, unspecified, uncomplicated: Secondary | ICD-10-CM | POA: Insufficient documentation

## 2017-11-08 LAB — CBC
HCT: 32.5 % — ABNORMAL LOW (ref 36.0–46.0)
Hemoglobin: 11 g/dL — ABNORMAL LOW (ref 12.0–15.0)
MCH: 30.6 pg (ref 26.0–34.0)
MCHC: 33.8 g/dL (ref 30.0–36.0)
MCV: 90.3 fL (ref 78.0–100.0)
Platelets: 344 10*3/uL (ref 150–400)
RBC: 3.6 MIL/uL — ABNORMAL LOW (ref 3.87–5.11)
RDW: 14.1 % (ref 11.5–15.5)
WBC: 7.6 10*3/uL (ref 4.0–10.5)

## 2017-11-08 LAB — BASIC METABOLIC PANEL
Anion gap: 12 (ref 5–15)
BUN: 24 mg/dL — ABNORMAL HIGH (ref 6–20)
CO2: 24 mmol/L (ref 22–32)
Calcium: 9.1 mg/dL (ref 8.9–10.3)
Chloride: 95 mmol/L — ABNORMAL LOW (ref 98–111)
Creatinine, Ser: 0.82 mg/dL (ref 0.44–1.00)
GFR calc Af Amer: >60 mL/min (ref >60)
GFR calc non Af Amer: >60 mL/min (ref >60)
Glucose, Bld: 489 mg/dL — ABNORMAL HIGH (ref 70–99)
Potassium: 4.3 mmol/L (ref 3.5–5.1)
Sodium: 131 mmol/L — ABNORMAL LOW (ref 135–145)

## 2017-11-08 LAB — PREGNANCY, URINE: Preg Test, Ur: NEGATIVE

## 2017-11-08 LAB — TROPONIN I: Troponin I: <0.03 ng/mL (ref <0.03)

## 2017-11-08 IMAGING — DX DG CHEST 2V
2 series · 2 of 2 positions shown · non-contrast
Comparison: [DATE]

CLINICAL DATA: Chest pain

EXAM:
CHEST - 2 VIEW

[chest pa]
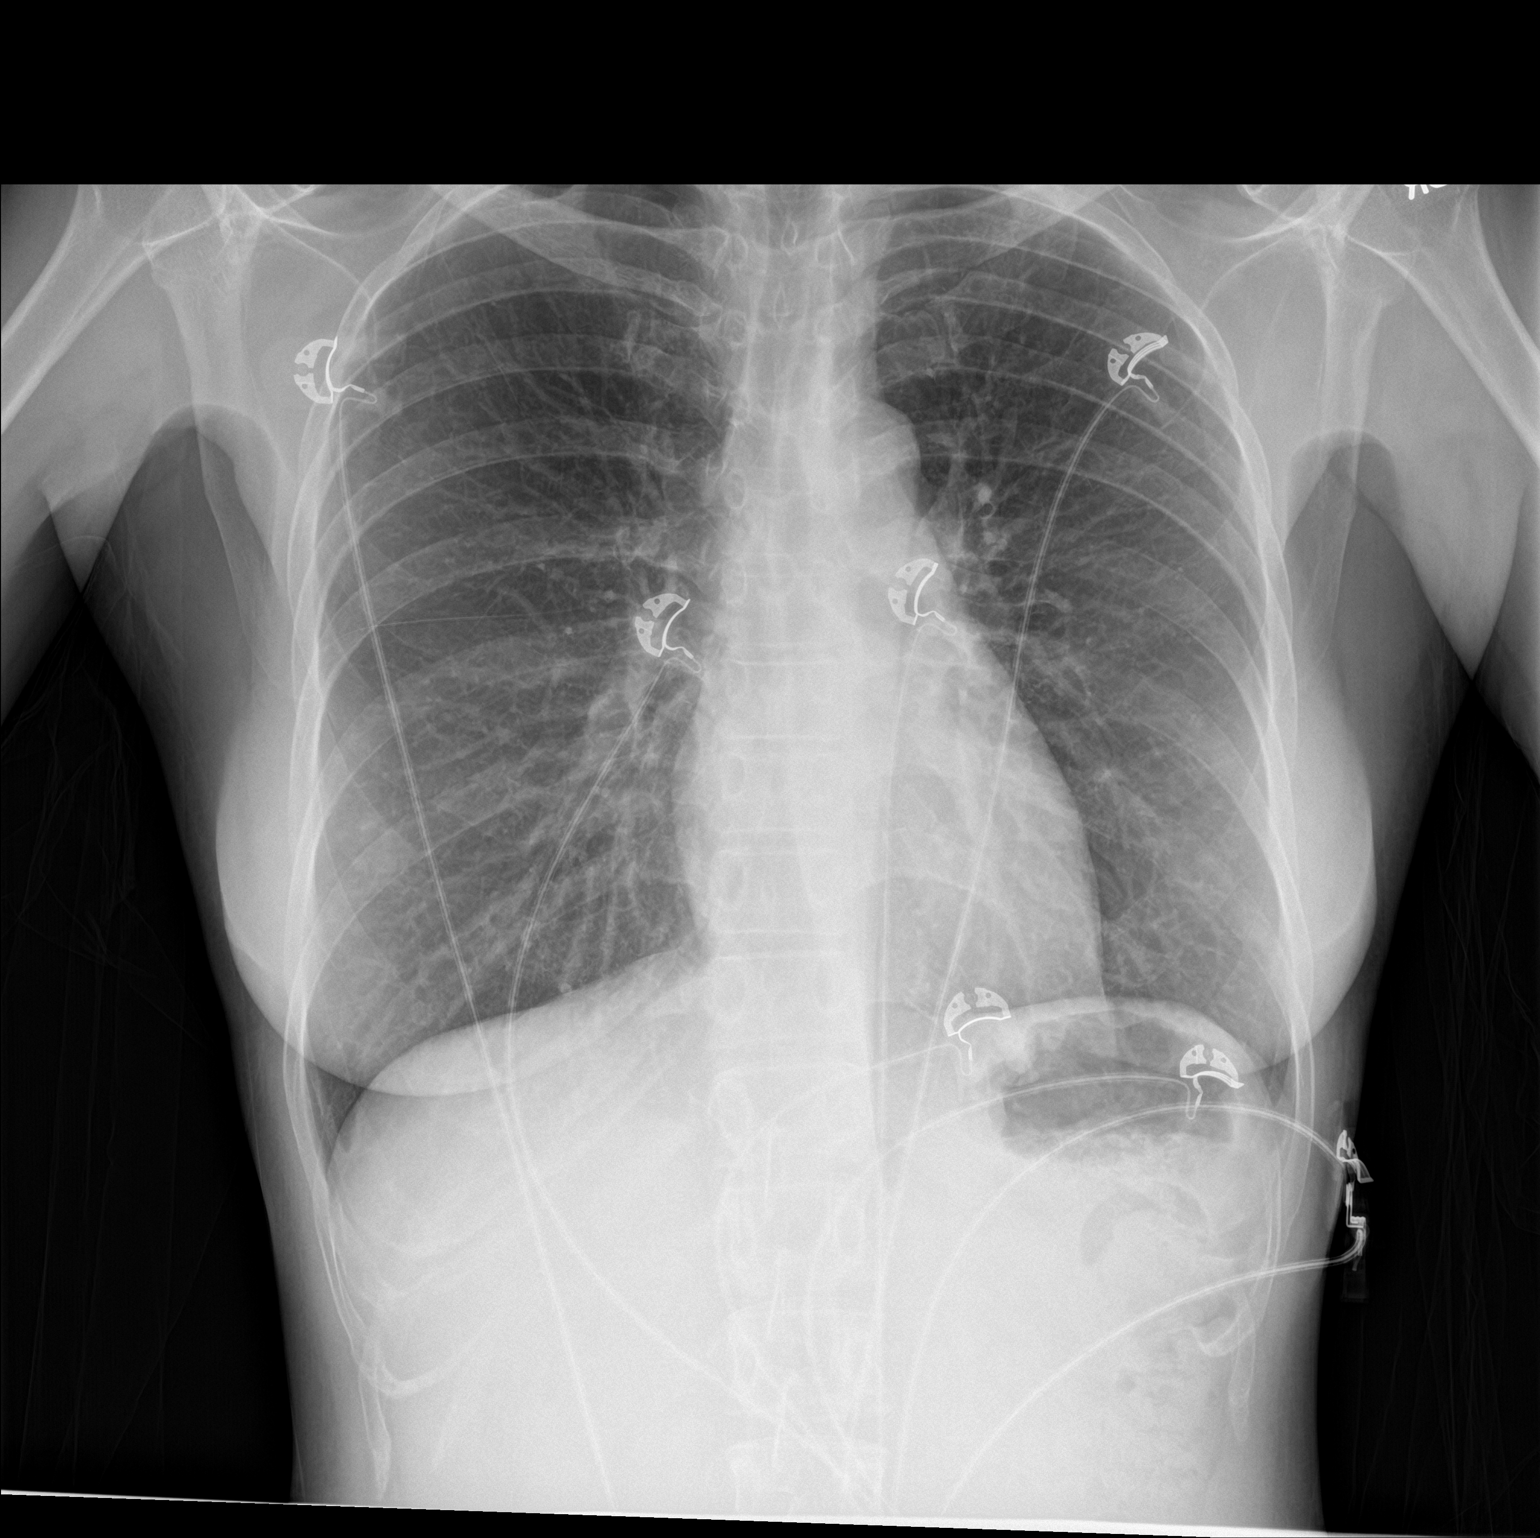

[chest lat]
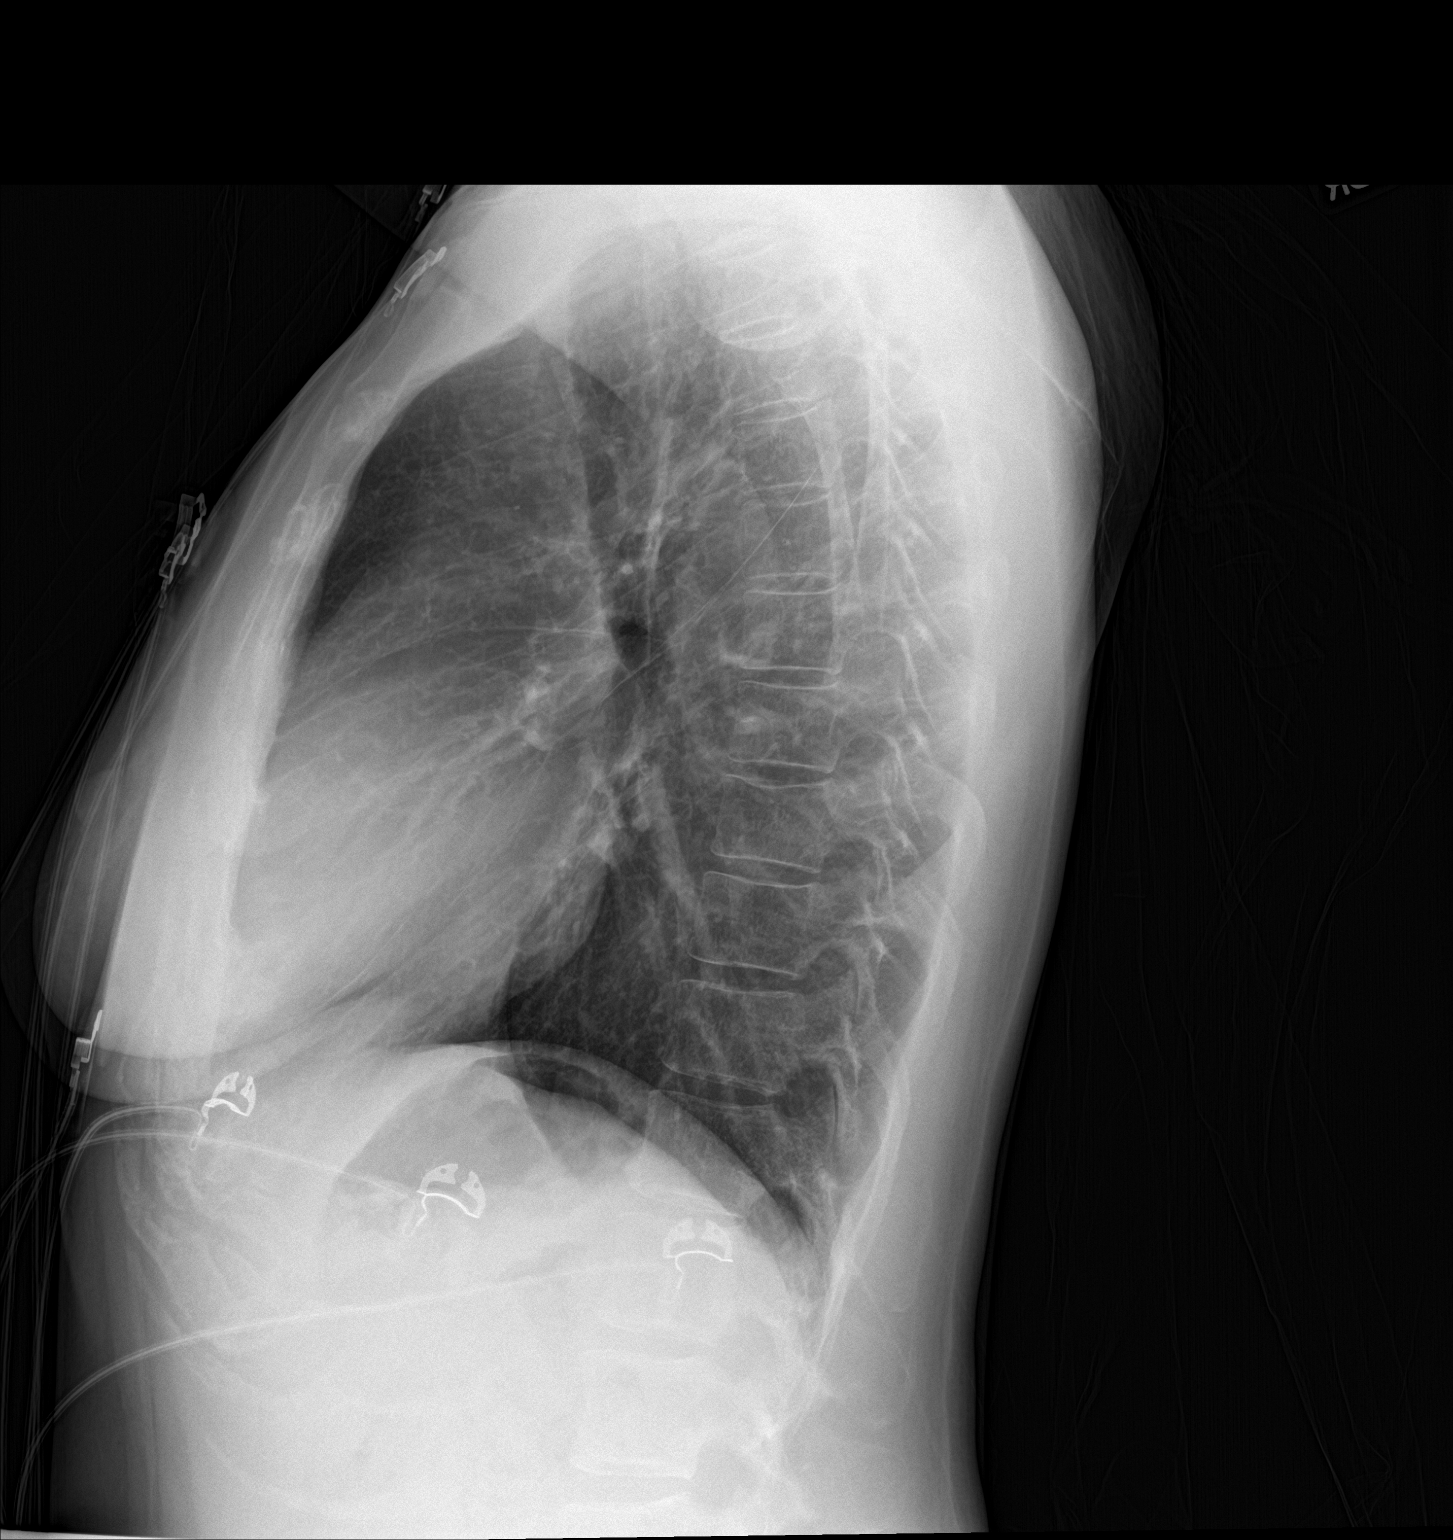

[2 of 2 positions shown; findings below may reference images not displayed]

FINDINGS: Normal heart size and mediastinal contours. No acute infiltrate or
edema. No effusion or pneumothorax. Artifact from EKG leads. No
acute osseous findings.
IMPRESSION: Negative chest.

## 2017-11-08 NOTE — Discharge Instructions (Addendum)
Aside from your blood sugar, your testing today is otherwise reassuring.  Your EKG (heart tracing) is unchanged from prior.  Your chest x-ray is without any acute abnormalities.  I have a low suspicion for emergent condition causing your arm pain, but I do want you to follow-up with your PCP and discuss cardiac stress testing.

## 2017-11-08 NOTE — ED Notes (Signed)
Pt given d/c instructions as per chart. Verbalizes understanding. No questions. 

## 2017-11-08 NOTE — ED Notes (Signed)
IV attempt x 1 without success.

## 2017-11-08 NOTE — ED Notes (Signed)
EDP into room, prior to RN assessment, see MD notes, pending orders.   

## 2017-11-08 NOTE — ED Notes (Signed)
ED Provider at bedside. 

## 2017-11-08 NOTE — ED Triage Notes (Signed)
Pt describes left arm pain and diaphoresis x 3 days. Also states she was walking down the hall 2 days ago and noticed sharp CP which later resolved after about 3-5 minutes. Took Aspirin 81 mg at that time. Also c/o some mild SHOB.

## 2017-11-08 NOTE — ED Notes (Signed)
Alert, NAD, calm, interactive, resps e/u, speaking in clear complete sentences, no dyspnea noted, skin W&D, VSS, c/o syncopal feeling, also L upper outer arm pain, (denies: pain at this time, sob, nausea). Family x2 at [facility].

## 2017-11-08 NOTE — ED Notes (Signed)
EDP at BS 

## 2017-11-15 NOTE — ED Provider Notes (Signed)
[facility] EMERGENCY DEPARTMENT Provider Note   CSN: [id number] Arrival date & time: 11/08/17  2022     History   Chief Complaint Chief Complaint  Patient presents with  . Arm Pain    HPI Zoe Clay is a 42 y.o. female.  HPI   42 year old female with intermittent left arm pain.  Pain around the left anterior shoulder into the axilla and left upper arm.  Intermittent.  Worse with movements.  Occasional sharper pain.  Denies any associated symptoms such as dyspnea or nausea.  No numbness or tingling.  Denies any Szymon neck pain.  No numbness.  Past Medical History:  Diagnosis Date  . Bipolar 1 disorder ([place])   . Diabetes mellitus   . High cholesterol   . Hypertension   . Peripheral neuropathy   . Schizophrenia, acute ([place])   . Seizures ([place])    last seizure May 2016  . Thyroid disease     Patient Active Problem List   Diagnosis Date Noted  . DKA (diabetic ketoacidoses) ([place]) 06/20/2012  . Nausea with vomiting 06/20/2012  . 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 Seizure disorder ([place]) 06/20/2012  . Unspecified hypothyroidism 06/20/2012  . Hyponatremia 06/20/2012    Past Surgical History:  Procedure Laterality Date  . ANKLE SURGERY    . TUBAL LIGATION       OB History   None      Home Medications    Prior to Admission medications   Medication Sig Start Date End Date Taking? Authorizing Provider  busPIRone (BUSPAR) 5 MG tablet Take 5 mg by mouth 2 (two) times daily.    [provider]  DULoxetine (CYMBALTA) 60 MG capsule Take 60 mg by mouth daily.    [provider]  gabapentin (NEURONTIN) 600 MG tablet Take 1 tablet (600 mg total) by mouth 2 (two) times daily. 01/10/15   Katheren Shams, DO  hydrOXYzine (ATARAX/VISTARIL) 25 MG tablet Take 1 tablet (25 mg total) by mouth every 6 (six) hours. 12/13/15   Joy, Shawn C, PA-C  hydrOXYzine (ATARAX/VISTARIL) 25 MG tablet Take 1  tablet (25 mg total) by mouth every 8 (eight) hours as needed for anxiety. 08/21/16   Katheren Shams, DO  insulin aspart (NOVOLOG) 100 UNIT/ML injection Inject into the skin 3 (three) times daily before meals.    [provider]  insulin glargine (LANTUS) 100 UNIT/ML injection Inject 15 Units into the skin daily. Patient taking differently: Inject 20 Units into the skin daily.  06/22/12   Regalado, Belkys A, MD  levothyroxine (SYNTHROID, LEVOTHROID) 125 MCG tablet Take 125 mcg by mouth daily.      [provider]  lisinopril (PRINIVIL,ZESTRIL) 5 MG tablet Take 5 mg by mouth daily.    [provider]  naproxen (NAPROSYN) 500 MG tablet Take 1 tablet (500 mg total) by mouth 2 (two) times daily. 04/11/17   Petrucelli, Glynda Jaeger, PA-C  phenytoin (DILANTIN) 100 MG ER capsule Take 200 mg by mouth 2 (two) times daily.     [provider]  phenytoin (DILANTIN) 200 MG ER capsule Take 1 capsule (200 mg total) by mouth 2 (two) times daily. 08/21/16 08/31/16  Katheren Shams, DO  risperiDONE (RISPERDAL) 2 MG tablet Take 2 mg by mouth 2 (two) times daily.    [provider]  risperiDONE (RISPERDAL) 2 MG tablet Take 1 tablet (2 mg total) by mouth 2 (two) times daily. 08/21/16 08/31/16  Luiz Blare Y, DO  simvastatin (ZOCOR) 20 MG tablet Take 20 mg by mouth every evening.    [provider]  traZODone (DESYREL) 100 MG tablet Take 100 mg by mouth at bedtime.    [provider]  traZODone (DESYREL) 100 MG tablet Take 1 tablet (100 mg total) by mouth at bedtime. 08/21/16   Katheren Shams, DO    Family History Family History  Problem Relation Age of Onset  . Diabetes Father   . Cancer Maternal Grandfather     Social History Social History   Tobacco Use  . Smoking status: Current Every Day Smoker    Packs/day: 0.25    Types: Cigarettes  . Smokeless tobacco: Never Used  Substance Use Topics  . Alcohol use: No  . Drug use: No     Allergies     Iodine and Iohexol   Review of Systems Review of Systems  All systems reviewed and negative, other than as noted in HPI.  Physical Exam Updated Vital Signs BP 104/66   Pulse 90   Temp 98.3 F (36.8 C) (Oral)   Resp (!) 21   Ht 5\' 5"  (1.651 m)   Wt 65.8 kg (145 lb)   LMP 10/13/2017 (Approximate)   SpO2 100%   BMI 24.13 kg/m   Physical Exam  Constitutional: She appears well-developed and well-nourished. No distress.  HENT:  Head: Normocephalic and atraumatic.  Eyes: Conjunctivae are normal. Right eye exhibits no discharge. Left eye exhibits no discharge.  Neck: Neck supple.  Cardiovascular: Normal rate, regular rhythm and normal heart sounds. Exam reveals no gallop and no friction rub.  No murmur heard. Pulmonary/Chest: Effort normal and breath sounds normal. No respiratory distress.  Abdominal: Soft. She exhibits no distension. There is no tenderness.  Musculoskeletal: She exhibits tenderness. She exhibits no edema.  No tenderness to palpation over the anterior and lateral left shoulder.  No deformity.  Able to actively range although she does have increased pain with abduction beyond 90 degrees.  Neurovascular intact.  Neurological: She is alert.  Skin: Skin is warm and dry.  Psychiatric: She has a normal mood and affect. Her behavior is normal. Thought content normal.  Nursing note and vitals reviewed.    ED Treatments / Results  Labs (all labs ordered are listed, but only abnormal results are displayed) Labs Reviewed  BASIC METABOLIC PANEL - Abnormal; Notable for the following components:      Result Value   Sodium 131 (*)    Chloride 95 (*)    Glucose, Bld 489 (*)    BUN 24 (*)    All other components within normal limits  CBC - Abnormal; Notable for the following components:   RBC 3.60 (*)    Hemoglobin 11.0 (*)    HCT 32.5 (*)    All other components within normal limits  TROPONIN I  PREGNANCY, URINE    EKG EKG Interpretation  Date/Time:  Sunday  November 08 2017 20:30:52 EDT Ventricular Rate:  94 PR Interval:    QRS Duration: 86 QT Interval:  360 QTC Calculation: 451 R Axis:   69 Text Interpretation:  Sinus rhythm Baseline wander in lead(s) II aVR aVF No significant change since last tracing Confirmed by Virgel Manifold [phone]) on 11/08/2017 8:52:43 PM Also confirmed by Virgel Manifold [phone]), editor Hattie Perch ([id number])  on 11/09/2017 7:43:13 AM   Radiology No results found.  Procedures Procedures (including critical care time)  Medications Ordered in ED Medications - No data  to display   Initial Impression / Assessment and Plan / ED Course  I have reviewed the triage vital signs and the nursing notes.  Pertinent labs & imaging results that were available during my care of the patient were reviewed by me and considered in my medical decision making (see chart for details).     42 year old female with left arm/shoulder pain.  I doubt ACS, PE, dissection or other emergent process.  Suspect this might be musculoskeletal.  Symptomatic treatment.  Return precautions discussed.  Final Clinical Impressions(s) / ED Diagnoses   Final diagnoses:  Left arm pain    ED Discharge Orders    None       Virgel Manifold, MD 11/15/17 1723

## 2017-12-29 ENCOUNTER — Encounter: Payer: Self-pay | Admitting: Emergency Medicine

## 2017-12-29 ENCOUNTER — Emergency Department (HOSPITAL_BASED_OUTPATIENT_CLINIC_OR_DEPARTMENT_OTHER)
Admission: EM | Admit: 2017-12-29 | Discharge: 2017-12-29 | Disposition: A | Discharge status: Home / Self Care | Payer: Self-pay | Attending: Emergency Medicine | Admitting: Emergency Medicine

## 2017-12-29 ENCOUNTER — Emergency Department (HOSPITAL_BASED_OUTPATIENT_CLINIC_OR_DEPARTMENT_OTHER): Payer: Self-pay

## 2017-12-29 DIAGNOSIS — F17210 Nicotine dependence, cigarettes, uncomplicated: Secondary | ICD-10-CM | POA: Insufficient documentation

## 2017-12-29 DIAGNOSIS — D219 Benign neoplasm of connective and other soft tissue, unspecified: Secondary | ICD-10-CM

## 2017-12-29 DIAGNOSIS — E109 Type 1 diabetes mellitus without complications: Secondary | ICD-10-CM | POA: Insufficient documentation

## 2017-12-29 DIAGNOSIS — R0789 Other chest pain: Secondary | ICD-10-CM | POA: Insufficient documentation

## 2017-12-29 DIAGNOSIS — Z794 Long term (current) use of insulin: Secondary | ICD-10-CM | POA: Insufficient documentation

## 2017-12-29 DIAGNOSIS — I10 Essential (primary) hypertension: Secondary | ICD-10-CM | POA: Insufficient documentation

## 2017-12-29 DIAGNOSIS — Z79899 Other long term (current) drug therapy: Secondary | ICD-10-CM | POA: Insufficient documentation

## 2017-12-29 DIAGNOSIS — D259 Leiomyoma of uterus, unspecified: Secondary | ICD-10-CM | POA: Insufficient documentation

## 2017-12-29 DIAGNOSIS — R1030 Lower abdominal pain, unspecified: Secondary | ICD-10-CM

## 2017-12-29 LAB — URINALYSIS, MICROSCOPIC (REFLEX)

## 2017-12-29 LAB — CBC WITH DIFFERENTIAL/PLATELET
Basophils Absolute: 0 10*3/uL (ref 0.0–0.1)
Basophils Relative: 0 %
Eosinophils Absolute: 0 10*3/uL (ref 0.0–0.7)
Eosinophils Relative: 0 %
HEMATOCRIT: 33.8 % — AB (ref 36.0–46.0)
HEMOGLOBIN: 11.8 g/dL — AB (ref 12.0–15.0)
LYMPHS ABS: 2.5 10*3/uL (ref 0.7–4.0)
Lymphocytes Relative: 28 %
MCH: 31.4 pg (ref 26.0–34.0)
MCHC: 34.9 g/dL (ref 30.0–36.0)
MCV: 89.9 fL (ref 78.0–100.0)
Monocytes Absolute: 0.4 10*3/uL (ref 0.1–1.0)
Monocytes Relative: 4 %
NEUTROS ABS: 6.2 10*3/uL (ref 1.7–7.7)
NEUTROS PCT: 68 %
Platelets: 391 10*3/uL (ref 150–400)
RBC: 3.76 MIL/uL — AB (ref 3.87–5.11)
RDW: 15 % (ref 11.5–15.5)
WBC: 9.1 10*3/uL (ref 4.0–10.5)

## 2017-12-29 LAB — COMPREHENSIVE METABOLIC PANEL
ALBUMIN: 4.6 g/dL (ref 3.5–5.0)
ALT: 18 U/L (ref 0–44)
ANION GAP: 12 (ref 5–15)
AST: 24 U/L (ref 15–41)
Alkaline Phosphatase: 126 U/L (ref 38–126)
BILIRUBIN TOTAL: 0.4 mg/dL (ref 0.3–1.2)
BUN: 16 mg/dL (ref 6–20)
CALCIUM: 9.7 mg/dL (ref 8.9–10.3)
CO2: 27 mmol/L (ref 22–32)
Chloride: 98 mmol/L (ref 98–111)
Creatinine, Ser: 0.54 mg/dL (ref 0.44–1.00)
Glucose, Bld: 194 mg/dL — ABNORMAL HIGH (ref 70–99)
POTASSIUM: 3.4 mmol/L — AB (ref 3.5–5.1)
Sodium: 137 mmol/L (ref 135–145)
Total Protein: 8.8 g/dL — ABNORMAL HIGH (ref 6.5–8.1)

## 2017-12-29 LAB — URINALYSIS, ROUTINE W REFLEX MICROSCOPIC
Bilirubin Urine: NEGATIVE
Glucose, UA: >=500 mg/dL — AB
Hgb urine dipstick: NEGATIVE
KETONES UR: 15 mg/dL — AB
LEUKOCYTES UA: NEGATIVE
NITRITE: NEGATIVE
Protein, ur: NEGATIVE mg/dL
pH: 5.5 (ref 5.0–8.0)

## 2017-12-29 LAB — I-STAT VENOUS BLOOD GAS, ED
ACID-BASE EXCESS: 1 mmol/L (ref 0.0–2.0)
BICARBONATE: 27.1 mmol/L (ref 20.0–28.0)
O2 SAT: 47 %
TCO2: 29 mmol/L (ref 22–32)
pCO2, Ven: 49.4 mmHg (ref 44.0–60.0)
pH, Ven: 7.348 (ref 7.250–7.430)
pO2, Ven: 27 mmHg — CL (ref 32.0–45.0)

## 2017-12-29 LAB — PREGNANCY, URINE: Preg Test, Ur: NEGATIVE

## 2017-12-29 LAB — LIPASE, BLOOD: LIPASE: 18 U/L (ref 11–51)

## 2017-12-29 LAB — TROPONIN I: Troponin I: <0.03 ng/mL (ref <0.03)

## 2017-12-29 IMAGING — CR DG CHEST 2V
2 series · 2 of 2 positions shown · non-contrast
Comparison: [DATE]

CLINICAL DATA: Central chest pain

EXAM:
CHEST - 2 VIEW

[w chest pa]
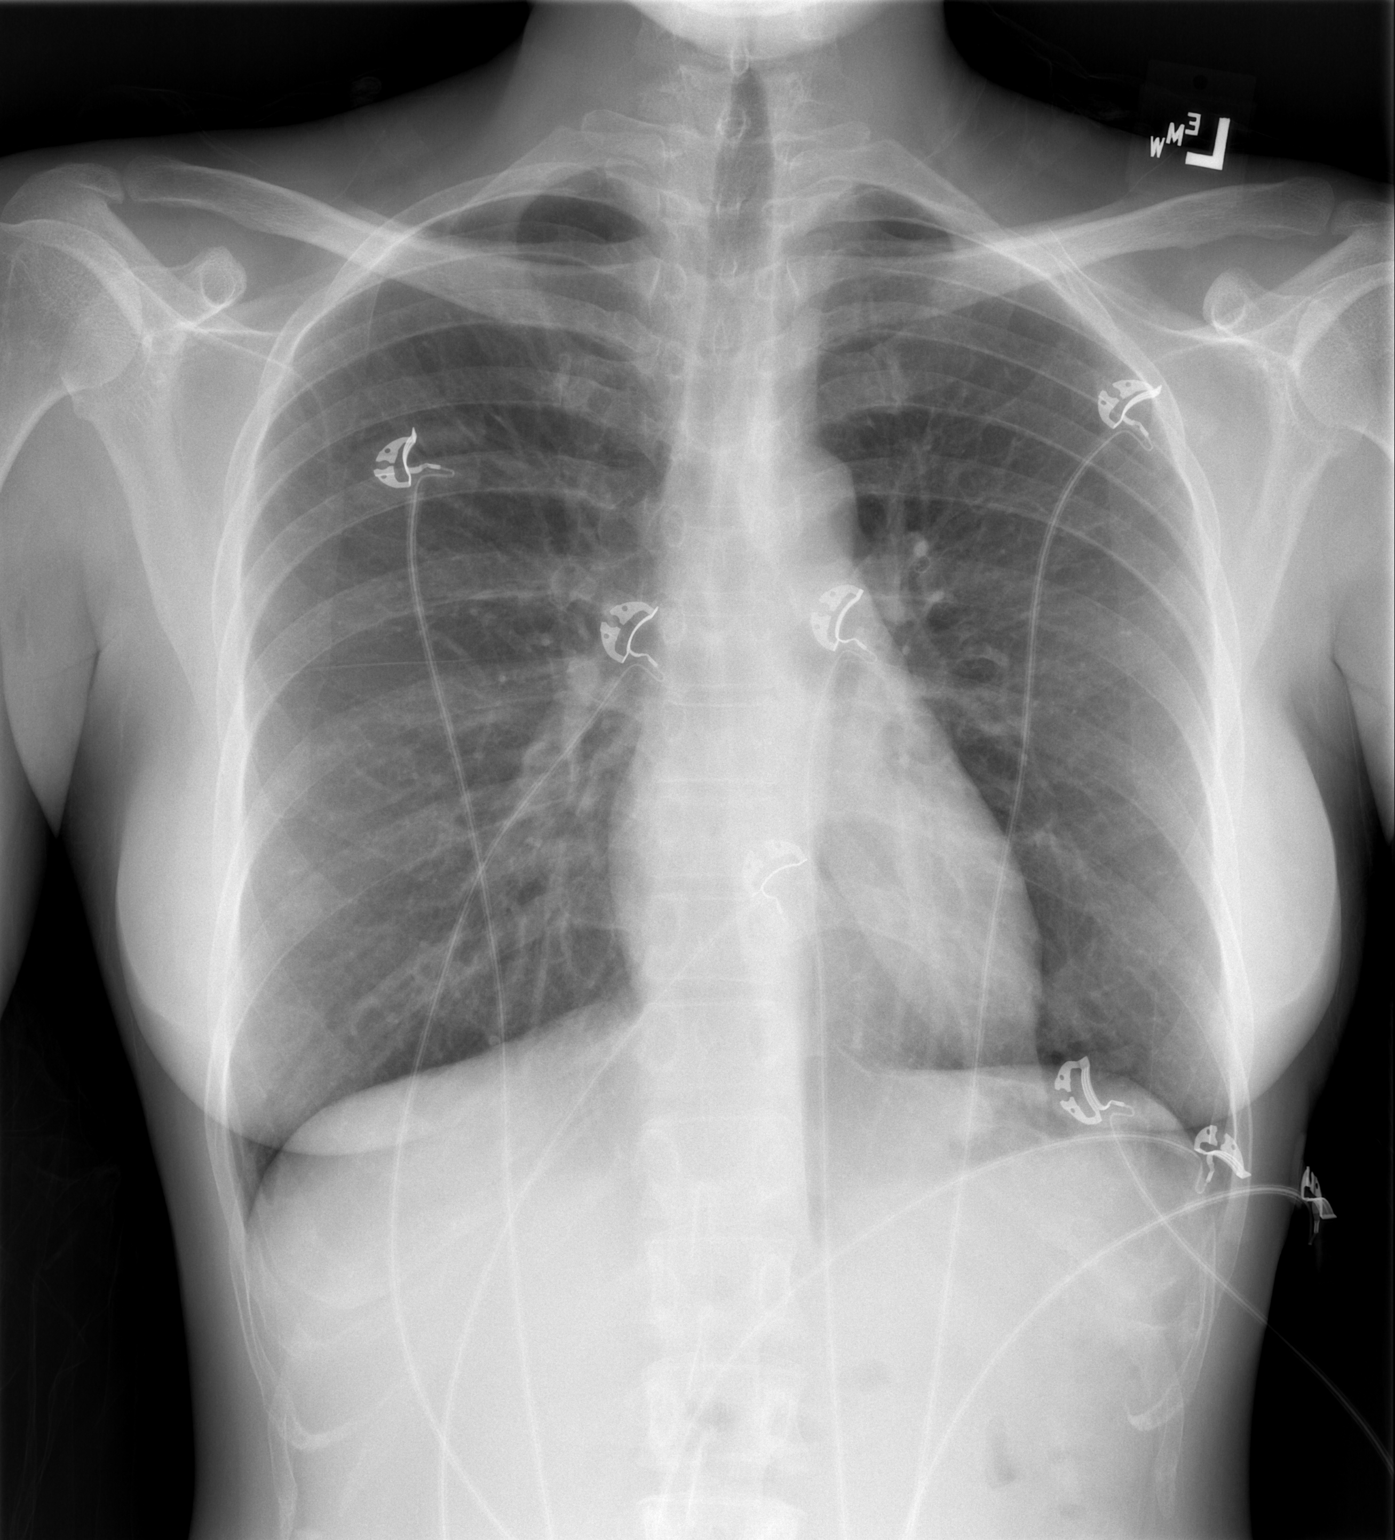

[w chest lat]
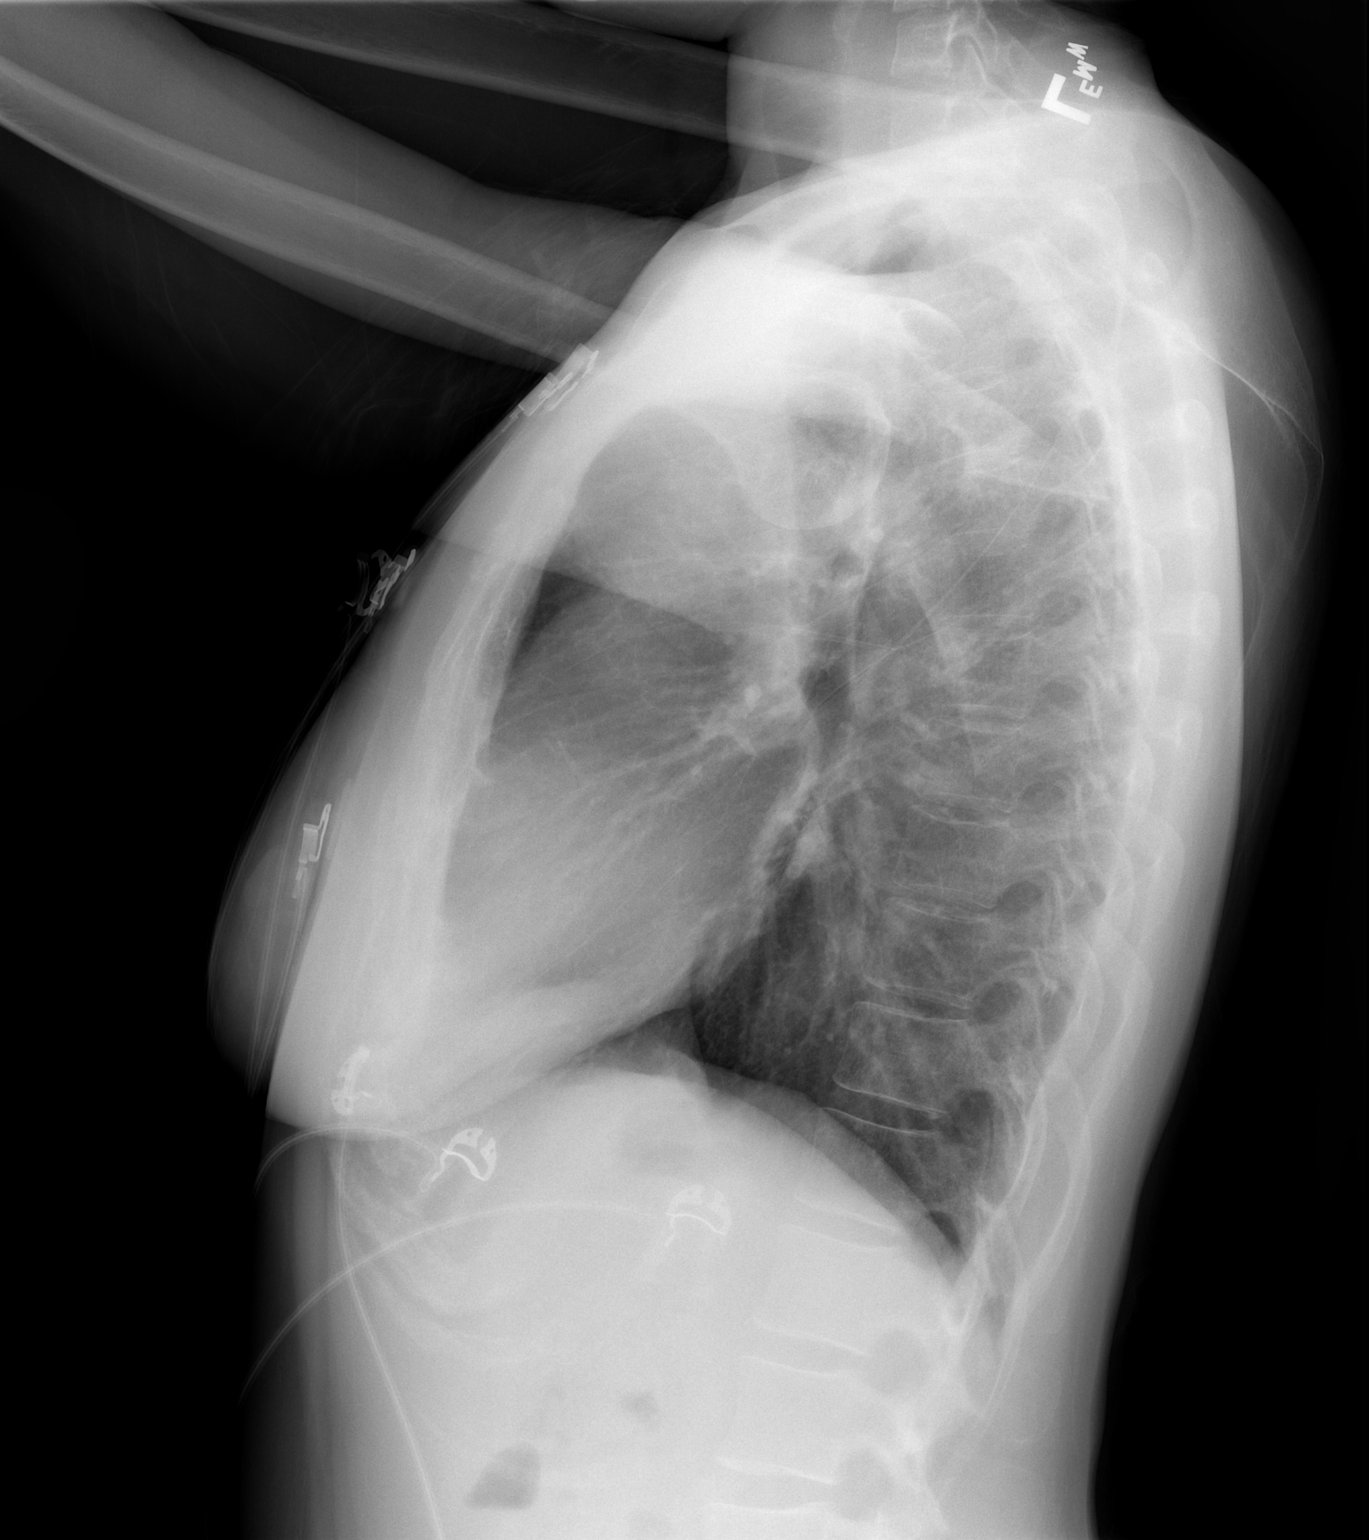

[2 of 2 positions shown; findings below may reference images not displayed]

FINDINGS: The lungs are clear without focal pneumonia, edema, pneumothorax or
pleural effusion. The cardiopericardial silhouette is within normal
limits for size. The visualized bony structures of the thorax are
intact. Telemetry leads overlie the chest.
IMPRESSION: No active cardiopulmonary disease.

## 2017-12-29 MED ORDER — OXYCODONE-ACETAMINOPHEN 5-325 MG PO TABS
1.0000 | ORAL_TABLET | Freq: Once | ORAL | Status: AC
  Administered 2017-12-29: 1 via ORAL
  Filled 2017-12-29: qty 1

## 2017-12-29 NOTE — ED Triage Notes (Signed)
Pt c/o lower back, center chest and lower abdominal pain; states hasn't had a menstral cycle since June, states wasn't pregnant but has an implantation. Called her PCP this am about chest pain and they told her to go to [facility] but states that's to far to drive.

## 2017-12-29 NOTE — ED Provider Notes (Signed)
[facility] EMERGENCY DEPARTMENT Provider Note   CSN: [id number] Arrival date & time: 12/29/17  4503     History   Chief Complaint Chief Complaint  Patient presents with  . Chest Pain    HPI Zoe Clay is a 42 y.o. female.  The history is provided by the patient and medical records. No language interpreter was used.  Abdominal Pain   This is a new problem. The current episode started more than 1 week ago. The problem occurs hourly. The problem has not changed since onset.The pain is associated with an unknown factor. The pain is located in the suprapubic region, RLQ and LLQ. The pain is moderate. Pertinent negatives include fever, diarrhea, nausea, vomiting, constipation, dysuria, frequency and headaches. Nothing aggravates the symptoms. Nothing relieves the symptoms.    Past Medical History:  Diagnosis Date  . Bipolar 1 disorder ([place])   . Diabetes mellitus   . High cholesterol   . Hypertension   . Peripheral neuropathy   . Schizophrenia, acute ([place])   . Seizures ([place])    last seizure May 2016  . Thyroid disease     Patient Active Problem List   Diagnosis Date Noted  . DKA (diabetic ketoacidoses) ([place]) 06/20/2012  . Nausea with vomiting 06/20/2012  . 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 Seizure disorder ([place]) 06/20/2012  . Unspecified hypothyroidism 06/20/2012  . Hyponatremia 06/20/2012    Past Surgical History:  Procedure Laterality Date  . ANKLE SURGERY    . TUBAL LIGATION       OB History   None      Home Medications    Prior to Admission medications   Medication Sig Start Date End Date Taking? Authorizing Provider  busPIRone (BUSPAR) 5 MG tablet Take 5 mg by mouth 2 (two) times daily.    [provider]  DULoxetine (CYMBALTA) 60 MG capsule Take 60 mg by mouth daily.    [provider]  gabapentin (NEURONTIN) 600 MG tablet Take 1 tablet (600 mg total) by mouth 2  (two) times daily. 01/10/15   Katheren Shams, DO  hydrOXYzine (ATARAX/VISTARIL) 25 MG tablet Take 1 tablet (25 mg total) by mouth every 6 (six) hours. 12/13/15   Joy, Shawn C, PA-C  hydrOXYzine (ATARAX/VISTARIL) 25 MG tablet Take 1 tablet (25 mg total) by mouth every 8 (eight) hours as needed for anxiety. 08/21/16   Katheren Shams, DO  insulin aspart (NOVOLOG) 100 UNIT/ML injection Inject into the skin 3 (three) times daily before meals.    [provider]  insulin glargine (LANTUS) 100 UNIT/ML injection Inject 15 Units into the skin daily. Patient taking differently: Inject 20 Units into the skin daily.  06/22/12   Regalado, Belkys A, MD  levothyroxine (SYNTHROID, LEVOTHROID) 125 MCG tablet Take 125 mcg by mouth daily.      [provider]  lisinopril (PRINIVIL,ZESTRIL) 5 MG tablet Take 5 mg by mouth daily.    [provider]  naproxen (NAPROSYN) 500 MG tablet Take 1 tablet (500 mg total) by mouth 2 (two) times daily. 04/11/17   Petrucelli, Glynda Jaeger, PA-C  phenytoin (DILANTIN) 100 MG ER capsule Take 200 mg by mouth 2 (two) times daily.     [provider]  phenytoin (DILANTIN) 200 MG ER capsule Take 1 capsule (200 mg total) by mouth 2 (two) times daily. 08/21/16 08/31/16  Katheren Shams, DO  risperiDONE (RISPERDAL) 2 MG tablet Take 2 mg by mouth  2 (two) times daily.    [provider]  risperiDONE (RISPERDAL) 2 MG tablet Take 1 tablet (2 mg total) by mouth 2 (two) times daily. 08/21/16 08/31/16  Katheren Shams, DO  simvastatin (ZOCOR) 20 MG tablet Take 20 mg by mouth every evening.    [provider]  traZODone (DESYREL) 100 MG tablet Take 100 mg by mouth at bedtime.    [provider]  traZODone (DESYREL) 100 MG tablet Take 1 tablet (100 mg total) by mouth at bedtime. 08/21/16   Katheren Shams, DO    Family History Family History  Problem Relation Age of Onset  . Diabetes Father   . Cancer Maternal Grandfather     Social  History Social History   Tobacco Use  . Smoking status: Current Every Day Smoker    Packs/day: 0.25    Types: Cigarettes  . Smokeless tobacco: Never Used  Substance Use Topics  . Alcohol use: No  . Drug use: No     Allergies   Iodine and Iohexol   Review of Systems Review of Systems  Constitutional: Negative for chills, diaphoresis, fatigue and fever.  HENT: Negative for congestion and rhinorrhea.   Eyes: Negative for visual disturbance.  Respiratory: Positive for chest tightness. Negative for apnea, cough, shortness of breath and wheezing.   Cardiovascular: Positive for chest pain (tightness) and leg swelling (chronic). Negative for palpitations.  Gastrointestinal: Positive for abdominal pain. Negative for constipation, diarrhea, nausea and vomiting.  Genitourinary: Negative for dysuria, flank pain and frequency.  Musculoskeletal: Positive for back pain. Negative for neck pain and neck stiffness.  Skin: Negative for rash and wound.  Neurological: Negative for light-headedness and headaches.  Psychiatric/Behavioral: Negative for agitation.  All other systems reviewed and are negative.    Physical Exam Updated Vital Signs LMP  (LMP Unknown)   Physical Exam  Constitutional: She is oriented to person, place, and time. She appears well-developed and well-nourished.  Non-toxic appearance. She does not appear ill. No distress.  HENT:  Head: Normocephalic and atraumatic.  Nose: Nose normal.  Mouth/Throat: Oropharynx is clear and moist. No oropharyngeal exudate.  Eyes: Pupils are equal, round, and reactive to light. Conjunctivae are normal.  Neck: Normal range of motion. Neck supple.  Cardiovascular: Normal rate and regular rhythm.  No murmur heard. Pulmonary/Chest: Effort normal and breath sounds normal. No respiratory distress. She has no wheezes. She has no rales. She exhibits no tenderness.  Abdominal: Soft. Bowel sounds are normal. She exhibits no distension. There is  no tenderness.  Musculoskeletal: She exhibits no edema or tenderness.  Neurological: She is alert and oriented to person, place, and time. No sensory deficit. She exhibits normal muscle tone.  Skin: Skin is warm and dry. Capillary refill takes less than 2 seconds. No rash noted. She is not diaphoretic.  Psychiatric: She has a normal mood and affect. Her behavior is normal.  Nursing note and vitals reviewed.    ED Treatments / Results  Labs (all labs ordered are listed, but only abnormal results are displayed) Labs Reviewed  URINALYSIS, ROUTINE W REFLEX MICROSCOPIC - Abnormal; Notable for the following components:      Result Value   Specific Gravity, Urine <1.005 (*)    Glucose, UA >=500 (*)    Ketones, ur 15 (*)    All other components within normal limits  CBC WITH DIFFERENTIAL/PLATELET - Abnormal; Notable for the following components:   RBC 3.76 (*)    Hemoglobin 11.8 (*)  HCT 33.8 (*)    All other components within normal limits  URINALYSIS, MICROSCOPIC (REFLEX) - Abnormal; Notable for the following components:   Bacteria, UA RARE (*)    All other components within normal limits  COMPREHENSIVE METABOLIC PANEL - Abnormal; Notable for the following components:   Potassium 3.4 (*)    Glucose, Bld 194 (*)    Total Protein 8.8 (*)    All other components within normal limits  I-STAT VENOUS BLOOD GAS, ED - Abnormal; Notable for the following components:   pO2, Ven 27.0 (*)    All other components within normal limits  URINE CULTURE  PREGNANCY, URINE  TROPONIN I  LIPASE, BLOOD  BLOOD GAS, VENOUS    EKG EKG Interpretation  Date/Time:  Tuesday December 29 2017 09:15:30 EDT Ventricular Rate:  94 PR Interval:    QRS Duration: 87 QT Interval:  363 QTC Calculation: 454 R Axis:   72 Text Interpretation:  Sinus rhythm When compared toi prior, no significant changes seen.  No STEMI.  Confirmed by Antony Blackbird [phone]) on 12/29/2017 9:48:15 AM   Radiology Dg Chest 2  View  Result Date: 12/29/2017 CLINICAL DATA:  Central chest pain EXAM: CHEST - 2 VIEW COMPARISON:  11/08/2017 FINDINGS: The lungs are clear without focal pneumonia, edema, pneumothorax or pleural effusion. The cardiopericardial silhouette is within normal limits for size. The visualized bony structures of the thorax are intact. Telemetry leads overlie the chest. IMPRESSION: No active cardiopulmonary disease. Electronically Signed   By: Misty Stanley M.D.   On: 12/29/2017 12:11   US Transvaginal Non-ob  Result Date: 12/29/2017 CLINICAL DATA:  Lower abdominal pain. EXAM: TRANSABDOMINAL AND TRANSVAGINAL ULTRASOUND OF PELVIS DOPPLER ULTRASOUND OF OVARIES TECHNIQUE: Both transabdominal and transvaginal ultrasound examinations of the pelvis were performed. Transabdominal technique was performed for global imaging of the pelvis including uterus, ovaries, adnexal regions, and pelvic cul-de-sac. It was necessary to proceed with endovaginal exam following the transabdominal exam to visualize the endometrium and ovaries. Color and duplex Doppler ultrasound was utilized to evaluate blood flow to the ovaries. COMPARISON:  CT scan of December 21, 2015. FINDINGS: Uterus Measurements: 10.3 x 5.6 x 4.4 cm. Two small fibroids are noted posteriorly, with the largest measuring 6 mm. Endometrium Thickness: 5 mm which is within normal limits. No focal abnormality visualized. Right ovary Measurements: 2.4 x 1.9 x 0.9 cm. Normal appearance/no adnexal mass. Left ovary Measurements: 3.6 x 2.2 x 2.0 cm.  1.6 cm follicular cyst is noted. Pulsed Doppler evaluation of both ovaries demonstrates normal low-resistance arterial and venous waveforms. Other findings Trace free fluid is noted which most likely is physiologic. IMPRESSION: Small uterine fibroids. No other significant abnormality seen in the pelvis. Electronically Signed   By: Marijo Conception, M.D.   On: 12/29/2017 12:18   US Pelvis Complete  Result Date: 12/29/2017 CLINICAL  DATA:  Lower abdominal pain. EXAM: TRANSABDOMINAL AND TRANSVAGINAL ULTRASOUND OF PELVIS DOPPLER ULTRASOUND OF OVARIES TECHNIQUE: Both transabdominal and transvaginal ultrasound examinations of the pelvis were performed. Transabdominal technique was performed for global imaging of the pelvis including uterus, ovaries, adnexal regions, and pelvic cul-de-sac. It was necessary to proceed with endovaginal exam following the transabdominal exam to visualize the endometrium and ovaries. Color and duplex Doppler ultrasound was utilized to evaluate blood flow to the ovaries. COMPARISON:  CT scan of December 21, 2015. FINDINGS: Uterus Measurements: 10.3 x 5.6 x 4.4 cm. Two small fibroids are noted posteriorly, with the largest measuring 6 mm. Endometrium Thickness: 5 mm  which is within normal limits. No focal abnormality visualized. Right ovary Measurements: 2.4 x 1.9 x 0.9 cm. Normal appearance/no adnexal mass. Left ovary Measurements: 3.6 x 2.2 x 2.0 cm.  1.6 cm follicular cyst is noted. Pulsed Doppler evaluation of both ovaries demonstrates normal low-resistance arterial and venous waveforms. Other findings Trace free fluid is noted which most likely is physiologic. IMPRESSION: Small uterine fibroids. No other significant abnormality seen in the pelvis. Electronically Signed   By: Marijo Conception, M.D.   On: 12/29/2017 12:18   [place] Art/ven Flow Abd Pelv Doppler  Result Date: 12/29/2017 CLINICAL DATA:  Lower abdominal pain. EXAM: TRANSABDOMINAL AND TRANSVAGINAL ULTRASOUND OF PELVIS DOPPLER ULTRASOUND OF OVARIES TECHNIQUE: Both transabdominal and transvaginal ultrasound examinations of the pelvis were performed. Transabdominal technique was performed for global imaging of the pelvis including uterus, ovaries, adnexal regions, and pelvic cul-de-sac. It was necessary to proceed with endovaginal exam following the transabdominal exam to visualize the endometrium and ovaries. Color and duplex Doppler ultrasound was utilized to  evaluate blood flow to the ovaries. COMPARISON:  CT scan of December 21, 2015. FINDINGS: Uterus Measurements: 10.3 x 5.6 x 4.4 cm. Two small fibroids are noted posteriorly, with the largest measuring 6 mm. Endometrium Thickness: 5 mm which is within normal limits. No focal abnormality visualized. Right ovary Measurements: 2.4 x 1.9 x 0.9 cm. Normal appearance/no adnexal mass. Left ovary Measurements: 3.6 x 2.2 x 2.0 cm.  1.6 cm follicular cyst is noted. Pulsed Doppler evaluation of both ovaries demonstrates normal low-resistance arterial and venous waveforms. Other findings Trace free fluid is noted which most likely is physiologic. IMPRESSION: Small uterine fibroids. No other significant abnormality seen in the pelvis. Electronically Signed   By: Marijo Conception, M.D.   On: 12/29/2017 12:18    Procedures Procedures (including critical care time)  Medications Ordered in ED Medications  oxyCODONE-acetaminophen (PERCOCET/ROXICET) 5-325 MG per tablet 1 tablet (1 tablet Oral Given 12/29/17 1322)     Initial Impression / Assessment and Plan / ED Course  I have reviewed the triage vital signs and the nursing notes.  Pertinent labs & imaging results that were available during my care of the patient were reviewed by me and considered in my medical decision making (see chart for details).     ZOEYA GRAMAJO is a 42 y.o. female with a past medical history significant for type 1 diabetes with history of DKA, hypothyroidism, seizures, schizophrenia, bipolar disorder, hypertension, hypercholesterolemia, and chronic peripheral neuropathies who presents with abdominal pain and chest pain.  Patient reports that she has not had a menstrual cycle for several months.  She reports that she was told back in February she may have had an "implantation pregnancy".  She is unsure what this means.  She says that she has been having intermittent lower abdominal pain and cramping.  She reports no constipation or diarrhea.   She reports no recent vaginal discharge, vaginal bleeding, or pelvic pain.  She denies any urinary symptoms.  She reports that she had some chest tightness over the last few days and when she called her PCP they told her to go to [facility].  Patient reports that she took a pregnancy test several weeks ago and was unsure what the result was.  She reports mild lower abdominal discomfort on arrival and reports her chest tightness has improved.  She denies fevers, chills, congestion, or cough.    On exam, chest is nontender.  Lungs are clear.  Abdomen is nontender.  Back is nontender.  Patient has no significant lower abdominal tenderness.  Legs had bilateral lower extremity edema which she reports is unchanged.  Patient has neuropathies in her legs which is unchanged.  Given the patient's report that she is unsure if she is pregnant and was feeling a cramping sensation in her lower abdomen, a bedside ultrasound was quickly performed to look for pregnancy.  There is no evidence of pregnancy seen on the bedside ultrasound.  Given her lack of vaginal bleeding, vaginal discharge, or pelvic pain, shared decision making conversation held and decision made not to perform pelvic exam however we would order the pelvic ultrasound.  Pregnancy test also confirmed no pregnancy has it was negative.  Patient will have screening laboratory testing to look for etiology of abdominal discomfort as well as chest x-ray and EKG.  EKG showed no STEMI.  Patient will have a pelvic ultrasound given the lower abdominal pain she has been having.    Anticipate reassessment after work-up.    2:33 PM Patient's work-up was overall reassuring.  Patient's ultrasound did not show evidence of torsion or pregnancy.  Patient was found to have uterine fibroids.  Given the patient's irregularity of her menstrual cycles recently, I suspect this is related to her discomfort.  Patient's labs showed mild hypokalemia but was otherwise  reassuring.  On reassessment, patient reports her pain has significantly improved.  She was able to eat and drink normally.  EKG showed no STEMI, chest x-ray shows no significant abnormality and troponin negative.  Given the patient's ongoing pain for several days and weeks, I do not feel she needs a delta troponin at this time.    Patient instructed to follow-up with her PCP and OB/GYN.  She reports has appointments coming up in the next week or 2.  Patient understood return precautions and had no other questions or concerns.  Patient was discharged in good condition.  Final Clinical Impressions(s) / ED Diagnoses   Final diagnoses:  Lower abdominal pain  Fibroids  Chest tightness    ED Discharge Orders    None     Clinical Impression: 1. Lower abdominal pain   2. Fibroids   3. Chest tightness     Disposition: Discharge  Condition: Good  I have discussed the results, Dx and Tx plan with the pt(& family if present). He/she/they expressed understanding and agree(s) with the plan. Discharge instructions discussed at great length. Strict return precautions discussed and pt &/or family have verbalized understanding of the instructions. No further questions at time of discharge.    Discharge Medication List as of 12/29/2017  2:36 PM      Follow Up: Inc, [facility] [address] [place] [address] [phone]     [place] [address] [id number] mc [place] [phone] Schedule an appointment as soon as possible for a visit       Nikayla Madaris, Gwenyth Allegra, MD 12/29/17 1600

## 2017-12-29 NOTE — Discharge Instructions (Signed)
Your work-up today showed evidence of uterine fibroids as the possible cause of your abdominal pain it is gone on for several months.  Your work-up was otherwise reassuring.  You are not pregnant.  Please follow-up with your OB/GYN and PCP for further management.  If any symptoms change or worsen, please return to the nearest emergency department.

## 2017-12-30 LAB — URINE CULTURE

## 2018-01-01 ENCOUNTER — Encounter: Payer: Self-pay | Admitting: Obstetrics & Gynecology

## 2018-01-01 ENCOUNTER — Ambulatory Visit (INDEPENDENT_AMBULATORY_CARE_PROVIDER_SITE_OTHER): Payer: Self-pay | Admitting: Obstetrics & Gynecology

## 2018-01-01 VITALS — BP 111/80 | HR 99 | Ht 65.0 in | Wt 144.7 lb

## 2018-01-01 DIAGNOSIS — N644 Mastodynia: Secondary | ICD-10-CM

## 2018-01-01 DIAGNOSIS — R1030 Lower abdominal pain, unspecified: Secondary | ICD-10-CM

## 2018-01-01 NOTE — Progress Notes (Signed)
GYNECOLOGY OFFICE VISIT NOTE  History:  42 y.o. [id number] here today for evaluation of lower abdominal pain; after being diagnosed with fibroids in the ED.  Pain in nonspecific, radiates to upper abdomen, makes her feel like her "throat and chest are closing up".  Reports shortening of menstrual periods, very light bleeding.  Pain not related to her periods.  Pain is episodic, alleviated by NSAIDs.  Also reports periodic breast tenderness in both breasts. No drainage.  She denies any current abnormal vaginal discharge, bleeding, pelvic pain or other concerns.   Past Medical History:  Diagnosis Date  . Bipolar 1 disorder ([place])   . Diabetes mellitus   . High cholesterol   . Hypertension   . Peripheral neuropathy   . Schizophrenia, acute ([place])   . Seizures ([place])    last seizure May 2016  . Thyroid disease     Past Surgical History:  Procedure Laterality Date  . ANKLE SURGERY Bilateral 2009  . TUBAL LIGATION  1999    The following portions of the patient's history were reviewed and updated as appropriate: allergies, current medications, past family history, past medical history, past social history, past surgical history and problem list.   Health Maintenance: Does not remember last pap, none seen in the system.  Review of Systems:  Pertinent items noted in HPI and remainder of comprehensive ROS otherwise negative.  Objective:  Physical Exam BP 111/80   Pulse 99   Ht 5\' 5"  (1.651 m)   Wt 144 lb 11.2 oz (65.6 kg)   LMP  (LMP Unknown) Comment: spotted in July 2019, last normal period June 2019  BMI 24.08 kg/m  CONSTITUTIONAL: Well-developed, well-nourished female in no acute distress.   SKIN: Skin is warm and dry. No rash noted. Not diaphoretic. NEUROLOGIC: Alert and oriented to person, place, and time. Normal reflexes, muscle tone coordination.  PSYCHIATRIC: Normal mood and affect. Normal behavior today. CARDIOVASCULAR: Normal heart rate noted,  RESPIRATORY: Effort and breath  sounds normal, no problems with respiration noted. BREASTS: Symmetric in size. No masses, skin changes, nipple drainage, or lymphadenopathy. ABDOMEN: Soft, normal bowel sounds, no distention noted. Mild lower abdominal diffuse tenderness, no rebound or guarding.  PELVIC: Deferred  MUSCULOSKELETAL: Normal range of motion. No tenderness.  No cyanosis, clubbing, or edema.  2+ distal pulses.   Labs and Imaging Results for orders placed or performed during the hospital encounter of 12/29/17 (from the past 168 hour(s))  Urine culture   Collection Time: 12/29/17  9:41 AM  Result Value Ref Range   Specimen Description      URINE, CLEAN CATCH Performed at [facility], [address] [place], [place] [address]    Special Requests      NONE Performed at [facility], [address] [place], [place] [address]    Culture MULTIPLE SPECIES PRESENT, SUGGEST RECOLLECTION (A)    Report Status 12/30/2017 FINAL   Pregnancy, urine   Collection Time: 12/29/17  9:41 AM  Result Value Ref Range   Preg Test, Ur NEGATIVE NEGATIVE  Urinalysis, Routine w reflex microscopic   Collection Time: 12/29/17  9:41 AM  Result Value Ref Range   Color, Urine YELLOW YELLOW   APPearance CLEAR CLEAR   Specific Gravity, Urine <1.005 (L) 1.005 - 1.030   pH 5.5 5.0 - 8.0   Glucose, UA >=500 (A) NEGATIVE mg/dL   Hgb urine dipstick NEGATIVE NEGATIVE   Bilirubin Urine NEGATIVE NEGATIVE   Ketones, ur 15 (  A) NEGATIVE mg/dL   Protein, ur NEGATIVE NEGATIVE mg/dL   Nitrite NEGATIVE NEGATIVE   Leukocytes, UA NEGATIVE NEGATIVE  Urinalysis, Microscopic (reflex)   Collection Time: 12/29/17  9:41 AM  Result Value Ref Range   RBC / HPF 0-5 0 - 5 RBC/hpf   WBC, UA 0-5 0 - 5 WBC/hpf   Bacteria, UA RARE (A) NONE SEEN   Squamous Epithelial / LPF 0-5 0 - 5   Budding Yeast PRESENT   CBC with Differential   Collection Time: 12/29/17 10:35 AM  Result Value Ref Range   WBC 9.1 4.0 - 10.5 K/uL   RBC 3.76 (L)  3.87 - 5.11 MIL/uL   Hemoglobin 11.8 (L) 12.0 - 15.0 g/dL   HCT 33.8 (L) 36.0 - 46.0 %   MCV 89.9 78.0 - 100.0 fL   MCH 31.4 26.0 - 34.0 pg   MCHC 34.9 30.0 - 36.0 g/dL   RDW 15.0 11.5 - 15.5 %   Platelets 391 150 - 400 K/uL   Neutrophils Relative % 68 %   Neutro Abs 6.2 1.7 - 7.7 K/uL   Lymphocytes Relative 28 %   Lymphs Abs 2.5 0.7 - 4.0 K/uL   Monocytes Relative 4 %   Monocytes Absolute 0.4 0.1 - 1.0 K/uL   Eosinophils Relative 0 %   Eosinophils Absolute 0.0 0.0 - 0.7 K/uL   Basophils Relative 0 %   Basophils Absolute 0.0 0.0 - 0.1 K/uL  Troponin I   Collection Time: 12/29/17 10:37 AM  Result Value Ref Range   Troponin I <0.03 <0.03 ng/mL  I-Stat venous blood gas, ED   Collection Time: 12/29/17 10:44 AM  Result Value Ref Range   pH, Ven 7.348 7.250 - 7.430   pCO2, Ven 49.4 44.0 - 60.0 mmHg   pO2, Ven 27.0 (LL) 32.0 - 45.0 mmHg   Bicarbonate 27.1 20.0 - 28.0 mmol/L   TCO2 29 22 - 32 mmol/L   O2 Saturation 47.0 %   Acid-Base Excess 1.0 0.0 - 2.0 mmol/L   Patient temperature HIDE    Collection site IV START    Drawn by Nurse    Sample type VENOUS    Comment VALUES EXPECTED, NO REPEAT   Comprehensive metabolic panel   Collection Time: 12/29/17 12:20 PM  Result Value Ref Range   Sodium 137 135 - 145 mmol/L   Potassium 3.4 (L) 3.5 - 5.1 mmol/L   Chloride 98 98 - 111 mmol/L   CO2 27 22 - 32 mmol/L   Glucose, Bld 194 (H) 70 - 99 mg/dL   BUN 16 6 - 20 mg/dL   Creatinine, Ser 0.54 0.44 - 1.00 mg/dL   Calcium 9.7 8.9 - 10.3 mg/dL   Total Protein 8.8 (H) 6.5 - 8.1 g/dL   Albumin 4.6 3.5 - 5.0 g/dL   AST 24 15 - 41 U/L   ALT 18 0 - 44 U/L   Alkaline Phosphatase 126 38 - 126 U/L   Total Bilirubin 0.4 0.3 - 1.2 mg/dL   GFR calc non Af Amer >60 >60 mL/min   GFR calc Af Amer >60 >60 mL/min   Anion gap 12 5 - 15  Lipase, blood   Collection Time: 12/29/17 12:20 PM  Result Value Ref Range   Lipase 18 11 - 51 U/L   Dg Chest 2 View  Result Date: 12/29/2017 CLINICAL DATA:   Central chest pain EXAM: CHEST - 2 VIEW COMPARISON:  11/08/2017 FINDINGS: The lungs are clear without focal pneumonia, edema,  pneumothorax or pleural effusion. The cardiopericardial silhouette is within normal limits for size. The visualized bony structures of the thorax are intact. Telemetry leads overlie the chest. IMPRESSION: No active cardiopulmonary disease. Electronically Signed   By: Misty Stanley M.D.   On: 12/29/2017 12:11   US Transvaginal Non-ob  Result Date: 12/29/2017 CLINICAL DATA:  Lower abdominal pain. EXAM: TRANSABDOMINAL AND TRANSVAGINAL ULTRASOUND OF PELVIS DOPPLER ULTRASOUND OF OVARIES TECHNIQUE: Both transabdominal and transvaginal ultrasound examinations of the pelvis were performed. Transabdominal technique was performed for global imaging of the pelvis including uterus, ovaries, adnexal regions, and pelvic cul-de-sac. It was necessary to proceed with endovaginal exam following the transabdominal exam to visualize the endometrium and ovaries. Color and duplex Doppler ultrasound was utilized to evaluate blood flow to the ovaries. COMPARISON:  CT scan of December 21, 2015. FINDINGS: Uterus Measurements: 10.3 x 5.6 x 4.4 cm. Two small fibroids are noted posteriorly, with the largest measuring 6 mm. Endometrium Thickness: 5 mm which is within normal limits. No focal abnormality visualized. Right ovary Measurements: 2.4 x 1.9 x 0.9 cm. Normal appearance/no adnexal mass. Left ovary Measurements: 3.6 x 2.2 x 2.0 cm.  1.6 cm follicular cyst is noted. Pulsed Doppler evaluation of both ovaries demonstrates normal low-resistance arterial and venous waveforms. Other findings Trace free fluid is noted which most likely is physiologic. IMPRESSION: Small uterine fibroids. No other significant abnormality seen in the pelvis. Electronically Signed   By: Marijo Conception, M.D.   On: 12/29/2017 12:18   US Pelvis Complete  Result Date: 12/29/2017 CLINICAL DATA:  Lower abdominal pain. EXAM: TRANSABDOMINAL  AND TRANSVAGINAL ULTRASOUND OF PELVIS DOPPLER ULTRASOUND OF OVARIES TECHNIQUE: Both transabdominal and transvaginal ultrasound examinations of the pelvis were performed. Transabdominal technique was performed for global imaging of the pelvis including uterus, ovaries, adnexal regions, and pelvic cul-de-sac. It was necessary to proceed with endovaginal exam following the transabdominal exam to visualize the endometrium and ovaries. Color and duplex Doppler ultrasound was utilized to evaluate blood flow to the ovaries. COMPARISON:  CT scan of December 21, 2015. FINDINGS: Uterus Measurements: 10.3 x 5.6 x 4.4 cm. Two small fibroids are noted posteriorly, with the largest measuring 6 mm. Endometrium Thickness: 5 mm which is within normal limits. No focal abnormality visualized. Right ovary Measurements: 2.4 x 1.9 x 0.9 cm. Normal appearance/no adnexal mass. Left ovary Measurements: 3.6 x 2.2 x 2.0 cm.  1.6 cm follicular cyst is noted. Pulsed Doppler evaluation of both ovaries demonstrates normal low-resistance arterial and venous waveforms. Other findings Trace free fluid is noted which most likely is physiologic. IMPRESSION: Small uterine fibroids. No other significant abnormality seen in the pelvis. Electronically Signed   By: Marijo Conception, M.D.   On: 12/29/2017 12:18   [place] Art/ven Flow Abd Pelv Doppler  Result Date: 12/29/2017 CLINICAL DATA:  Lower abdominal pain. EXAM: TRANSABDOMINAL AND TRANSVAGINAL ULTRASOUND OF PELVIS DOPPLER ULTRASOUND OF OVARIES TECHNIQUE: Both transabdominal and transvaginal ultrasound examinations of the pelvis were performed. Transabdominal technique was performed for global imaging of the pelvis including uterus, ovaries, adnexal regions, and pelvic cul-de-sac. It was necessary to proceed with endovaginal exam following the transabdominal exam to visualize the endometrium and ovaries. Color and duplex Doppler ultrasound was utilized to evaluate blood flow to the ovaries. COMPARISON:   CT scan of December 21, 2015. FINDINGS: Uterus Measurements: 10.3 x 5.6 x 4.4 cm. Two small fibroids are noted posteriorly, with the largest measuring 6 mm. Endometrium Thickness: 5 mm which is within normal limits. No  focal abnormality visualized. Right ovary Measurements: 2.4 x 1.9 x 0.9 cm. Normal appearance/no adnexal mass. Left ovary Measurements: 3.6 x 2.2 x 2.0 cm.  1.6 cm follicular cyst is noted. Pulsed Doppler evaluation of both ovaries demonstrates normal low-resistance arterial and venous waveforms. Other findings Trace free fluid is noted which most likely is physiologic. IMPRESSION: Small uterine fibroids. No other significant abnormality seen in the pelvis. Electronically Signed   By: Marijo Conception, M.D.   On: 12/29/2017 12:18    Assessment & Plan:  1. Lower abdominal pain Discussed ultrasound results, she has tiny fibroids not likely causing the pain pattern she is talking about.  She was reassured that no acute GYN etiology is seen. Told to take NSAIDs prn pain. Reassuring about normal periods, especially given that they are light (heavy bleeding is the norm for symptomatic fibroids). She needs a pap smear, referred to [facility].  2. Pain of both breasts Normal exam, patient reassured. Referral to [facility] for exam and mammogram.   Routine preventative health maintenance measures emphasized. Please refer to After Visit Summary for other counseling recommendations.   Return if symptoms worsen or fail to improve.  Total face-to-face time with patient: 20 minutes.  Over 50% of encounter was spent on counseling and coordination of care.   Verita Schneiders, MD, [place] for [employer], [place]

## 2018-01-01 NOTE — Patient Instructions (Signed)
Return to clinic for any scheduled appointments or for any gynecologic concerns as needed.   

## 2018-01-04 ENCOUNTER — Encounter: Payer: Self-pay | Admitting: Family Medicine

## 2018-01-08 ENCOUNTER — Telehealth (HOSPITAL_COMMUNITY): Payer: Self-pay | Admitting: *Deleted

## 2018-01-08 NOTE — Telephone Encounter (Signed)
Telephoned patient at home number and left message to return call to [facility] 

## 2018-01-18 ENCOUNTER — Other Ambulatory Visit (HOSPITAL_COMMUNITY): Payer: Self-pay | Admitting: *Deleted

## 2018-01-18 DIAGNOSIS — N644 Mastodynia: Secondary | ICD-10-CM

## 2018-02-09 ENCOUNTER — Ambulatory Visit (HOSPITAL_COMMUNITY): Payer: Self-pay

## 2018-02-09 ENCOUNTER — Other Ambulatory Visit: Payer: Self-pay

## 2018-04-13 ENCOUNTER — Encounter (HOSPITAL_BASED_OUTPATIENT_CLINIC_OR_DEPARTMENT_OTHER): Payer: Self-pay

## 2018-04-13 ENCOUNTER — Other Ambulatory Visit: Payer: Self-pay

## 2018-04-13 ENCOUNTER — Emergency Department (HOSPITAL_BASED_OUTPATIENT_CLINIC_OR_DEPARTMENT_OTHER)
Admission: EM | Admit: 2018-04-13 | Discharge: 2018-04-13 | Disposition: A | Discharge status: Home / Self Care | Payer: Self-pay | Attending: Emergency Medicine | Admitting: Emergency Medicine

## 2018-04-13 DIAGNOSIS — G43909 Migraine, unspecified, not intractable, without status migrainosus: Secondary | ICD-10-CM | POA: Insufficient documentation

## 2018-04-13 DIAGNOSIS — Z5321 Procedure and treatment not carried out due to patient leaving prior to being seen by health care provider: Secondary | ICD-10-CM | POA: Insufficient documentation

## 2018-04-13 NOTE — ED Triage Notes (Addendum)
C/o migraine day 2-NAD-steady gait-states she was dx migraine age 42

## 2018-04-13 NOTE — ED Notes (Signed)
Pt not found in [place].  Registration staff reportedly saw pt walk out the [place] door at 1534.

## 2018-04-13 NOTE — ED Notes (Signed)
Attempted to call pt and ask about urine specimen but she did not answer.  Cannot find her in the [place].

## 2018-06-01 ENCOUNTER — Encounter (HOSPITAL_BASED_OUTPATIENT_CLINIC_OR_DEPARTMENT_OTHER): Payer: Self-pay

## 2018-06-01 ENCOUNTER — Other Ambulatory Visit: Payer: Self-pay

## 2018-06-01 ENCOUNTER — Emergency Department (HOSPITAL_BASED_OUTPATIENT_CLINIC_OR_DEPARTMENT_OTHER)
Admission: EM | Admit: 2018-06-01 | Discharge: 2018-06-01 | Disposition: A | Discharge status: Home / Self Care | Payer: Self-pay | Attending: Emergency Medicine | Admitting: Emergency Medicine

## 2018-06-01 DIAGNOSIS — E039 Hypothyroidism, unspecified: Secondary | ICD-10-CM | POA: Insufficient documentation

## 2018-06-01 DIAGNOSIS — I10 Essential (primary) hypertension: Secondary | ICD-10-CM | POA: Insufficient documentation

## 2018-06-01 DIAGNOSIS — Z79899 Other long term (current) drug therapy: Secondary | ICD-10-CM | POA: Insufficient documentation

## 2018-06-01 DIAGNOSIS — E1065 Type 1 diabetes mellitus with hyperglycemia: Secondary | ICD-10-CM | POA: Insufficient documentation

## 2018-06-01 DIAGNOSIS — F17210 Nicotine dependence, cigarettes, uncomplicated: Secondary | ICD-10-CM | POA: Insufficient documentation

## 2018-06-01 DIAGNOSIS — R82998 Other abnormal findings in urine: Secondary | ICD-10-CM | POA: Insufficient documentation

## 2018-06-01 DIAGNOSIS — Z7984 Long term (current) use of oral hypoglycemic drugs: Secondary | ICD-10-CM | POA: Insufficient documentation

## 2018-06-01 DIAGNOSIS — R739 Hyperglycemia, unspecified: Secondary | ICD-10-CM

## 2018-06-01 DIAGNOSIS — B379 Candidiasis, unspecified: Secondary | ICD-10-CM

## 2018-06-01 LAB — CBG MONITORING, ED
GLUCOSE-CAPILLARY: 359 mg/dL — AB (ref 70–99)
GLUCOSE-CAPILLARY: 110 mg/dL — AB (ref 70–99)
Glucose-Capillary: 37 mg/dL — CL (ref 70–99)
Glucose-Capillary: 100 mg/dL — ABNORMAL HIGH (ref 70–99)

## 2018-06-01 LAB — URINALYSIS, ROUTINE W REFLEX MICROSCOPIC
Bilirubin Urine: NEGATIVE
Glucose, UA: >=500 mg/dL — AB
Ketones, ur: 15 mg/dL — AB
Leukocytes,Ua: NEGATIVE
Nitrite: NEGATIVE
Protein, ur: NEGATIVE mg/dL
Specific Gravity, Urine: 1.015 (ref 1.005–1.030)
pH: 5.5 (ref 5.0–8.0)

## 2018-06-01 LAB — POCT I-STAT EG7
Acid-base deficit: 1 mmol/L (ref 0.0–2.0)
Bicarbonate: 25.3 mmol/L (ref 20.0–28.0)
Calcium, Ion: 1.2 mmol/L (ref 1.15–1.40)
HCT: 32 % — ABNORMAL LOW (ref 36.0–46.0)
Hemoglobin: 10.9 g/dL — ABNORMAL LOW (ref 12.0–15.0)
O2 Saturation: 63 %
POTASSIUM: 3.5 mmol/L (ref 3.5–5.1)
SODIUM: 135 mmol/L (ref 135–145)
TCO2: 27 mmol/L (ref 22–32)
pCO2, Ven: 46.5 mmHg (ref 44.0–60.0)
pH, Ven: 7.344 (ref 7.250–7.430)
pO2, Ven: 35 mmHg (ref 32.0–45.0)

## 2018-06-01 LAB — COMPREHENSIVE METABOLIC PANEL
ALT: 24 U/L (ref 0–44)
AST: 33 U/L (ref 15–41)
Albumin: 4.2 g/dL (ref 3.5–5.0)
Alkaline Phosphatase: 167 U/L — ABNORMAL HIGH (ref 38–126)
Anion gap: 17 — ABNORMAL HIGH (ref 5–15)
BUN: 22 mg/dL — ABNORMAL HIGH (ref 6–20)
CO2: 20 mmol/L — ABNORMAL LOW (ref 22–32)
Calcium: 9.8 mg/dL (ref 8.9–10.3)
Chloride: 95 mmol/L — ABNORMAL LOW (ref 98–111)
Creatinine, Ser: 0.97 mg/dL (ref 0.44–1.00)
GFR calc non Af Amer: >60 mL/min (ref >60)
Glucose, Bld: 310 mg/dL — ABNORMAL HIGH (ref 70–99)
Potassium: 3.5 mmol/L (ref 3.5–5.1)
Sodium: 132 mmol/L — ABNORMAL LOW (ref 135–145)
Total Bilirubin: 0.5 mg/dL (ref 0.3–1.2)
Total Protein: 8.3 g/dL — ABNORMAL HIGH (ref 6.5–8.1)

## 2018-06-01 LAB — URINALYSIS, MICROSCOPIC (REFLEX): WBC, UA: NONE SEEN WBC/hpf (ref 0–5)

## 2018-06-01 LAB — BASIC METABOLIC PANEL
Anion gap: 9 (ref 5–15)
BUN: 18 mg/dL (ref 6–20)
CO2: 22 mmol/L (ref 22–32)
Calcium: 9.1 mg/dL (ref 8.9–10.3)
Chloride: 102 mmol/L (ref 98–111)
Creatinine, Ser: 0.57 mg/dL (ref 0.44–1.00)
GFR calc Af Amer: >60 mL/min (ref >60)
Glucose, Bld: 62 mg/dL — ABNORMAL LOW (ref 70–99)
Potassium: 3.3 mmol/L — ABNORMAL LOW (ref 3.5–5.1)
Sodium: 133 mmol/L — ABNORMAL LOW (ref 135–145)

## 2018-06-01 LAB — CBC
HCT: 38.1 % (ref 36.0–46.0)
Hemoglobin: 12.2 g/dL (ref 12.0–15.0)
MCH: 29.6 pg (ref 26.0–34.0)
MCHC: 32 g/dL (ref 30.0–36.0)
MCV: 92.5 fL (ref 80.0–100.0)
Platelets: 426 10*3/uL — ABNORMAL HIGH (ref 150–400)
RBC: 4.12 MIL/uL (ref 3.87–5.11)
RDW: 14.9 % (ref 11.5–15.5)
WBC: 7.2 10*3/uL (ref 4.0–10.5)
nRBC: 0 % (ref 0.0–0.2)

## 2018-06-01 LAB — PREGNANCY, URINE: Preg Test, Ur: NEGATIVE

## 2018-06-01 MED ORDER — FLUCONAZOLE 150 MG PO TABS: 150.0000 mg | ORAL_TABLET | Freq: Every day | ORAL | 0 refills | Status: DC

## 2018-06-01 MED ORDER — DEXTROSE 50 % IV SOLN
0.5000 | Freq: Once | INTRAVENOUS | Status: AC
  Administered 2018-06-01: 25 mL via INTRAVENOUS
  Filled 2018-06-01: qty 50

## 2018-06-01 MED ORDER — INSULIN ASPART 100 UNIT/ML ~~LOC~~ SOLN
5.0000 [IU] | Freq: Once | SUBCUTANEOUS | Status: AC
  Administered 2018-06-01: 5 [IU] via SUBCUTANEOUS
  Filled 2018-06-01: qty 1

## 2018-06-01 MED ORDER — SODIUM CHLORIDE 0.9 % IV BOLUS
1000.0000 mL | Freq: Once | INTRAVENOUS | Status: AC
  Administered 2018-06-01: 1000 mL via INTRAVENOUS

## 2018-06-01 MED ORDER — SODIUM CHLORIDE 0.9 % IV BOLUS: 1000.0000 mL | Freq: Once | INTRAVENOUS | Status: DC

## 2018-06-01 NOTE — ED Provider Notes (Signed)
[facility] EMERGENCY DEPARTMENT Provider Note   CSN: [id number] Arrival date & time: 06/01/18  1511    History   Chief Complaint Chief Complaint  Patient presents with  . Hyperglycemia    HPI Zoe Clay is a 43 y.o. female with a hx of tobacco abuse, seizures, HTN, hypercholesterolemia, bipolar 1 disorder, and schizophrenia who presents to the ED from PCP office for hyperglycemia that started yesterday. Patient states she noted her blood glucoses were running high at home yesterday in the 300s which prompted PCP visit, sugar undetectably high therefore sent to ER for further evaluation. States she has had some polyuria & polydipsia. Notes that 3 days prior she was having nausea/vomiting for a day, TNTC episodes of emesis, but has since been tolerating PO without difficulty. She is utilizing her insulin as prescribed: 25 units of Levimir @ bedtime with sliding scale. Did give herself 10 units PTA and had a cheeseburger for lunch. Denies fever, chills, URI sxs, chest pain, dyspnea, re-occurrence of emesis, abdominal pain, diarrhea, or dysuria.      HPI  Past Medical History:  Diagnosis Date  . Bipolar 1 disorder ([place])   . Diabetes mellitus   . High cholesterol   . Hypertension   . Peripheral neuropathy   . Schizophrenia, acute ([place])   . Seizures ([place])    last seizure May 2016  . Thyroid disease     Patient Active Problem List   Diagnosis Date Noted  . DKA (diabetic ketoacidoses) ([place]) 06/20/2012  . Nausea with vomiting 06/20/2012  . 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 Seizure disorder ([place]) 06/20/2012  . Unspecified hypothyroidism 06/20/2012  . Hyponatremia 06/20/2012    Past Surgical History:  Procedure Laterality Date  . ANKLE SURGERY Bilateral 2009  . TUBAL LIGATION  1999     OB History    Gravida  4   Para  3   Term  3   Preterm  0   AB  1   Living  3     SAB  1   TAB  0   Ectopic  0   Multiple  0   Live Births  3            Home Medications    Prior to Admission medications   Medication Sig Start Date End Date Taking? Authorizing Provider  busPIRone (BUSPAR) 5 MG tablet Take 5 mg by mouth 2 (two) times daily.    [provider]  DULoxetine (CYMBALTA) 60 MG capsule Take 60 mg by mouth daily.    [provider]  gabapentin (NEURONTIN) 600 MG tablet Take 1 tablet (600 mg total) by mouth 2 (two) times daily. 01/10/15   Katheren Shams, DO  hydrOXYzine (ATARAX/VISTARIL) 25 MG tablet Take 1 tablet (25 mg total) by mouth every 6 (six) hours. Patient not taking: Reported on 01/01/2018 12/13/15   Joy, Raquel Sarna C, PA-C  insulin aspart (NOVOLOG) 100 UNIT/ML injection Inject into the skin 3 (three) times daily before meals.    [provider]  insulin glargine (LANTUS) 100 UNIT/ML injection Inject 15 Units into the skin daily. 06/22/12   Regalado, Belkys A, MD  levothyroxine (SYNTHROID, LEVOTHROID) 125 MCG tablet Take 125 mcg by mouth daily.      [provider]  lisinopril (PRINIVIL,ZESTRIL) 5 MG tablet Take 5 mg by mouth daily.    [provider]  naproxen (NAPROSYN) 500 MG tablet Take 1 tablet (500 mg  total) by mouth 2 (two) times daily. 04/11/17   Ellia Knowlton, Glynda Jaeger, PA-C  phenytoin (DILANTIN) 100 MG ER capsule Take 200 mg by mouth 2 (two) times daily.     [provider]  phenytoin (DILANTIN) 200 MG ER capsule Take 1 capsule (200 mg total) by mouth 2 (two) times daily. 08/21/16 08/31/16  Katheren Shams, DO  risperiDONE (RISPERDAL) 2 MG tablet Take 2 mg by mouth 2 (two) times daily.    [provider]  risperiDONE (RISPERDAL) 2 MG tablet Take 1 tablet (2 mg total) by mouth 2 (two) times daily. 08/21/16 08/31/16  Katheren Shams, DO  simvastatin (ZOCOR) 20 MG tablet Take 20 mg by mouth every evening.    [provider]  traZODone (DESYREL) 100 MG tablet Take 100 mg by mouth at bedtime.     [provider]  traZODone (DESYREL) 100 MG tablet Take 1 tablet (100 mg total) by mouth at bedtime. Patient not taking: Reported on 01/01/2018 08/21/16   Katheren Shams, DO    Family History Family History  Problem Relation Age of Onset  . Diabetes Father   . Cancer Maternal Grandfather     Social History Social History   Tobacco Use  . Smoking status: Current Every Day Smoker    Packs/day: 0.25    Types: Cigarettes  . Smokeless tobacco: Never Used  Substance Use Topics  . Alcohol use: No  . Drug use: No     Allergies   Contrast media [iodinated diagnostic agents] and Iohexol   Review of Systems Review of Systems  Constitutional: Negative for chills and fever.  HENT: Negative for congestion, ear pain and sore throat.   Respiratory: Negative for cough and shortness of breath.   Cardiovascular: Negative for chest pain.  Gastrointestinal: Positive for vomiting (resolved). Negative for abdominal pain, blood in stool and diarrhea.  Endocrine: Positive for polydipsia and polyuria.  Genitourinary: Negative for dysuria.  All other systems reviewed and are negative.    Physical Exam Updated Vital Signs BP 111/77 (BP Location: Right Arm)   Pulse (!) 106   Temp 97.6 F (36.4 C) (Oral)   Resp 18   Ht 5\' 4"  (1.626 m)   Wt 70.3 kg   LMP  (LMP Unknown)   SpO2 100%   BMI 26.61 kg/m   Physical Exam Vitals signs and nursing note reviewed.  Constitutional:      General: She is not in acute distress.    Appearance: She is well-developed. She is not toxic-appearing.  HENT:     Head: Normocephalic and atraumatic.     Mouth/Throat:     Mouth: Mucous membranes are dry.  Eyes:     General:        Right eye: No discharge.        Left eye: No discharge.     Conjunctiva/sclera: Conjunctivae normal.  Neck:     Musculoskeletal: Neck supple.  Cardiovascular:     Rate and Rhythm: Regular rhythm. Tachycardia present.  Pulmonary:     Effort: Pulmonary effort is  normal. No respiratory distress.     Breath sounds: Normal breath sounds. No wheezing, rhonchi or rales.  Abdominal:     General: There is no distension.     Palpations: Abdomen is soft.     Tenderness: There is no abdominal tenderness. There is no guarding or rebound.  Skin:    General: Skin is warm and dry.     Findings: No rash.  Neurological:  Mental Status: She is alert.     Comments: Clear speech.   Psychiatric:        Behavior: Behavior normal.      ED Treatments / Results  Labs (all labs ordered are listed, but only abnormal results are displayed) Labs Reviewed  CBC - Abnormal; Notable for the following components:      Result Value   Platelets 426 (*)    All other components within normal limits  COMPREHENSIVE METABOLIC PANEL - Abnormal; Notable for the following components:   Sodium 132 (*)    Chloride 95 (*)    CO2 20 (*)    Glucose, Bld 310 (*)    BUN 22 (*)    Total Protein 8.3 (*)    Alkaline Phosphatase 167 (*)    Anion gap 17 (*)    All other components within normal limits  URINALYSIS, ROUTINE W REFLEX MICROSCOPIC - Abnormal; Notable for the following components:   Glucose, UA >=500 (*)    Hgb urine dipstick TRACE (*)    Ketones, ur 15 (*)    All other components within normal limits  URINALYSIS, MICROSCOPIC (REFLEX) - Abnormal; Notable for the following components:   Bacteria, UA RARE (*)    All other components within normal limits  BASIC METABOLIC PANEL - Abnormal; Notable for the following components:   Sodium 133 (*)    Potassium 3.3 (*)    Glucose, Bld 62 (*)    All other components within normal limits  CBG MONITORING, ED - Abnormal; Notable for the following components:   Glucose-Capillary 359 (*)    All other components within normal limits  POCT I-STAT EG7 - Abnormal; Notable for the following components:   HCT 32.0 (*)    Hemoglobin 10.9 (*)    All other components within normal limits  CBG MONITORING, ED - Abnormal; Notable for  the following components:   Glucose-Capillary 110 (*)    All other components within normal limits  CBG MONITORING, ED - Abnormal; Notable for the following components:   Glucose-Capillary 37 (*)    All other components within normal limits  PREGNANCY, URINE  BLOOD GAS, VENOUS    EKG None  Radiology No results found.  Procedures Procedures (including critical care time)  Medications Ordered in ED Medications  dextrose 50 % solution 25 mL (has no administration in time range)  sodium chloride 0.9 % bolus 1,000 mL (0 mLs Intravenous Stopped 06/01/18 1720)  insulin aspart (novoLOG) injection 5 Units (5 Units Subcutaneous Given 06/01/18 1721)    Initial Impression / Assessment and Plan / ED Course  I have reviewed the triage vital signs and the nursing notes.  Pertinent labs & imaging results that were available during my care of the patient were reviewed by me and considered in my medical decision making (see chart for details).   Patient w/ hx of T1DM presents to the ED for hyperglycemia, had N/V a few days prior that has since resolved. Nontoxic appearing, mildly tachycardic upon arrival, vitals otherwise WNL. Mucous membranes are mildly dry. Exam otherwise benign. 1L of fluids ordered.   Initial work-up reviewed:  CBC: No leukocytosis or anemia. Elevated platelets - PCP recheck CMP: Hyperglycemic to 310 on CMP, bicarb midly low at 20, anion gap elevated @ 17.  UA: Mild amount of ketonuria, glucosuria present, not consistent with UTI. Yeast present, will give diflucan.   Discussed findings & plan of care with Dr. Darl Householder- recommends blood gas to assess acidosis- obtained, pH  WNL, subsequently recommends 5 units SQ insulin w/ plan for repeat BMP. I am in agreement.   Repeat CBG 110.   Delay in disposition secondary to BMP hemolyzed x 2.   Repeat BMP with mild hypoglycemia. Anion gap normalized, no acidosis, not consistent with DKA.  CBG rechecked hypoglycemic at 37- patient alert  & oriented. Will give D25, patient to eat & drink, repeat CBG.   20:00: Patient signed out to [employer] PA-C at change of shift pending repeat CBG. If increased to 100 can be discharged home with close monitoring of blood sugars & PCP Follow up.   Findings and plan of care discussed with supervising physician Dr. Darl Householder who provided guidance in management & is in agreement with plan.   Final Clinical Impressions(s) / ED Diagnoses   Final diagnoses:  Hyperglycemia  Yeast detected    ED Discharge Orders         Ordered    fluconazole (DIFLUCAN) 150 MG tablet  Daily     06/01/18 [address], PA-C 06/01/18 2011    Drenda Freeze, MD 06/01/18 [phone]

## 2018-06-01 NOTE — ED Provider Notes (Signed)
  Physical Exam  BP 97/70 (BP Location: Right Arm)   Pulse 92   Temp 98.3 F (36.8 C) (Oral)   Resp 18   Ht 5\' 4"  (1.626 m)   Wt 70.3 kg   LMP  (LMP Unknown)   SpO2 100%   BMI 26.61 kg/m   Physical Exam  ED Course/Procedures     Procedures  MDM   Patient was passed to me due to end of shift of the previous physician assistant.  Patient was stabilized and her blood glucose normalized at 100.  Patient would like to go home.  Patient's labs were improved and she is not in DKA.   Counseled pt on good return precautions and encouraged both PCP and ED follow-up as needed.   Clinical Impression: 1. Hyperglycemia   2. Yeast detected     Disposition: Discharge    This note was prepared with assistance of Dragon voice recognition software. Occasional wrong-word or sound-a-like substitutions may have occurred due to the inherent limitations of voice recognition software.      Kristine Royal 06/01/18 2043    Drenda Freeze, MD 06/01/18 [phone]

## 2018-06-01 NOTE — ED Notes (Signed)
PT states understanding of care given, follow up care, and medication prescribed. PT ambulated from ED to car with a steady gait. 

## 2018-06-01 NOTE — Discharge Instructions (Addendum)
You are seen in the ER today for elevated blood sugar and then low blood sugar.  This improved in the Er with monitoring. Your urine did show that it had some yeast in it, we are sending you home with Diflucan, a medicine to treat yeast infections.    We have prescribed you new medication(s) today. Discuss the medications prescribed today with your pharmacist as they can have adverse effects and interactions with your other medicines including over the counter and prescribed medications. Seek medical evaluation if you start to experience new or abnormal symptoms after taking one of these medicines, seek care immediately if you start to experience difficulty breathing, feeling of your throat closing, facial swelling, or rash as these could be indications of a more serious allergic reaction  Please follow-up closely with your primary care provider and continue to monitor your blood sugars very closely at home.  Return to the ER for new or worsening symptoms including but not limited to high sugars, chest pain, shortness of breath, abdominal pain, inability to keep fluids down, passing out, or any other concerns.

## 2018-06-01 NOTE — ED Triage Notes (Signed)
Pt states she was sent by PCP for elevated BS today-NAD-to triage in w/c

## 2018-12-15 ENCOUNTER — Emergency Department (HOSPITAL_BASED_OUTPATIENT_CLINIC_OR_DEPARTMENT_OTHER): Payer: Self-pay

## 2018-12-15 ENCOUNTER — Other Ambulatory Visit: Payer: Self-pay

## 2018-12-15 ENCOUNTER — Emergency Department (HOSPITAL_BASED_OUTPATIENT_CLINIC_OR_DEPARTMENT_OTHER)
Admission: EM | Admit: 2018-12-15 | Discharge: 2018-12-15 | Disposition: A | Discharge status: Home / Self Care | Payer: Self-pay | Attending: Emergency Medicine | Admitting: Emergency Medicine

## 2018-12-15 ENCOUNTER — Encounter (HOSPITAL_BASED_OUTPATIENT_CLINIC_OR_DEPARTMENT_OTHER): Payer: Self-pay | Admitting: Emergency Medicine

## 2018-12-15 DIAGNOSIS — E079 Disorder of thyroid, unspecified: Secondary | ICD-10-CM | POA: Insufficient documentation

## 2018-12-15 DIAGNOSIS — R609 Edema, unspecified: Secondary | ICD-10-CM

## 2018-12-15 DIAGNOSIS — R0789 Other chest pain: Secondary | ICD-10-CM | POA: Insufficient documentation

## 2018-12-15 DIAGNOSIS — Z79899 Other long term (current) drug therapy: Secondary | ICD-10-CM | POA: Insufficient documentation

## 2018-12-15 DIAGNOSIS — I10 Essential (primary) hypertension: Secondary | ICD-10-CM | POA: Insufficient documentation

## 2018-12-15 DIAGNOSIS — E109 Type 1 diabetes mellitus without complications: Secondary | ICD-10-CM | POA: Insufficient documentation

## 2018-12-15 DIAGNOSIS — F17210 Nicotine dependence, cigarettes, uncomplicated: Secondary | ICD-10-CM | POA: Insufficient documentation

## 2018-12-15 DIAGNOSIS — R785 Finding of other psychotropic drug in blood: Secondary | ICD-10-CM | POA: Insufficient documentation

## 2018-12-15 DIAGNOSIS — R0602 Shortness of breath: Secondary | ICD-10-CM | POA: Insufficient documentation

## 2018-12-15 DIAGNOSIS — Z91041 Radiographic dye allergy status: Secondary | ICD-10-CM | POA: Insufficient documentation

## 2018-12-15 DIAGNOSIS — R600 Localized edema: Secondary | ICD-10-CM | POA: Insufficient documentation

## 2018-12-15 DIAGNOSIS — Z794 Long term (current) use of insulin: Secondary | ICD-10-CM | POA: Insufficient documentation

## 2018-12-15 LAB — BASIC METABOLIC PANEL
Anion gap: 13 (ref 5–15)
BUN: 17 mg/dL (ref 6–20)
CO2: 19 mmol/L — ABNORMAL LOW (ref 22–32)
Calcium: 8.9 mg/dL (ref 8.9–10.3)
Chloride: 99 mmol/L (ref 98–111)
Creatinine, Ser: 0.79 mg/dL (ref 0.44–1.00)
GFR calc Af Amer: >60 mL/min (ref >60)
GFR calc non Af Amer: >60 mL/min (ref >60)
Glucose, Bld: 219 mg/dL — ABNORMAL HIGH (ref 70–99)
Potassium: 3.8 mmol/L (ref 3.5–5.1)
Sodium: 131 mmol/L — ABNORMAL LOW (ref 135–145)

## 2018-12-15 LAB — CBC WITH DIFFERENTIAL/PLATELET
Abs Immature Granulocytes: 0.03 10*3/uL (ref 0.00–0.07)
Basophils Absolute: 0 10*3/uL (ref 0.0–0.1)
Basophils Relative: 1 %
Eosinophils Absolute: 0.2 10*3/uL (ref 0.0–0.5)
Eosinophils Relative: 2 %
HCT: 33.9 % — ABNORMAL LOW (ref 36.0–46.0)
Hemoglobin: 11 g/dL — ABNORMAL LOW (ref 12.0–15.0)
Immature Granulocytes: 0 %
Lymphocytes Relative: 42 %
Lymphs Abs: 3.2 10*3/uL (ref 0.7–4.0)
MCH: 31.1 pg (ref 26.0–34.0)
MCHC: 32.4 g/dL (ref 30.0–36.0)
MCV: 95.8 fL (ref 80.0–100.0)
Monocytes Absolute: 0.5 10*3/uL (ref 0.1–1.0)
Monocytes Relative: 7 %
Neutro Abs: 3.7 10*3/uL (ref 1.7–7.7)
Neutrophils Relative %: 48 %
Platelets: 399 10*3/uL (ref 150–400)
RBC: 3.54 MIL/uL — ABNORMAL LOW (ref 3.87–5.11)
RDW: 16.5 % — ABNORMAL HIGH (ref 11.5–15.5)
WBC: 7.6 10*3/uL (ref 4.0–10.5)
nRBC: 0 % (ref 0.0–0.2)

## 2018-12-15 LAB — PHENYTOIN LEVEL, TOTAL: Phenytoin Lvl: <2.5 ug/mL — ABNORMAL LOW (ref 10.0–20.0)

## 2018-12-15 LAB — TROPONIN I (HIGH SENSITIVITY): Troponin I (High Sensitivity): <2 ng/L (ref <18)

## 2018-12-15 LAB — BRAIN NATRIURETIC PEPTIDE: B Natriuretic Peptide: 33.5 pg/mL (ref 0.0–100.0)

## 2018-12-15 IMAGING — US US EXTREM LOW VENOUS
1 series · 13 of 24 positions shown · non-contrast
Comparison: None.

CLINICAL DATA: Bilateral lower extremity edema



[Series 1: us extrem low venous · 13 of 55 slices shown]
[im 1/55]
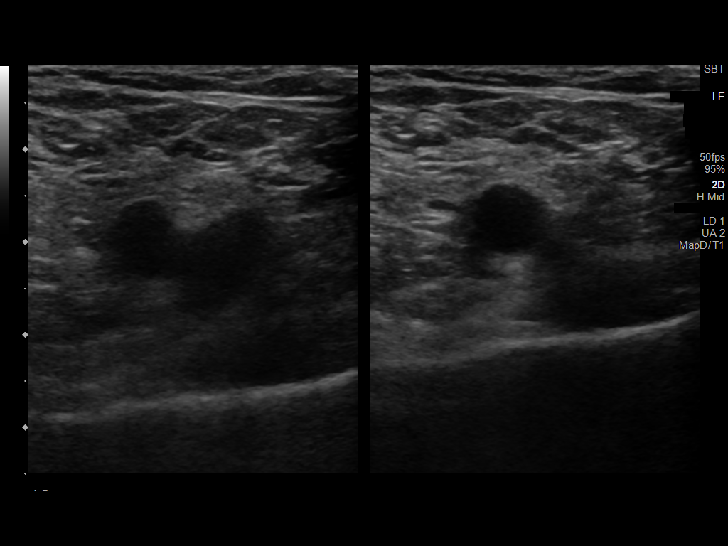
[im 5/55]
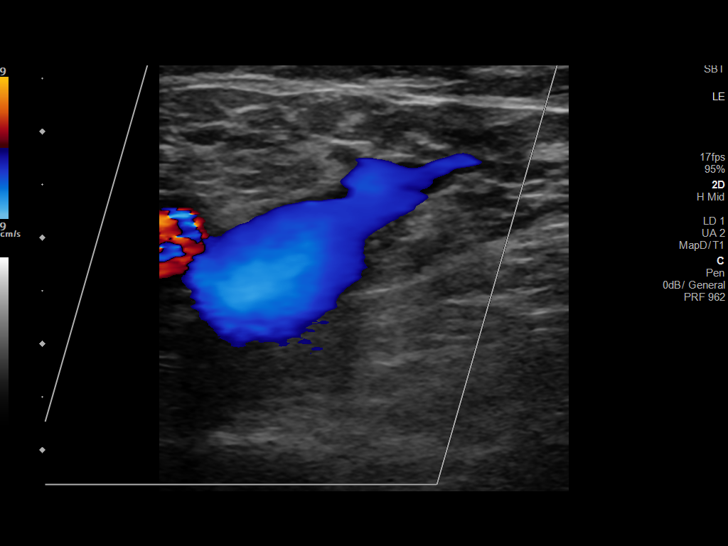
[im 10/55]
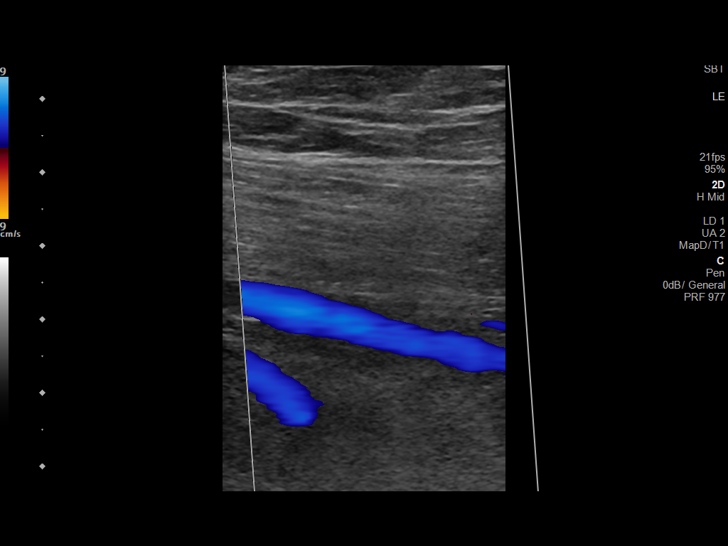
[im 15/55]
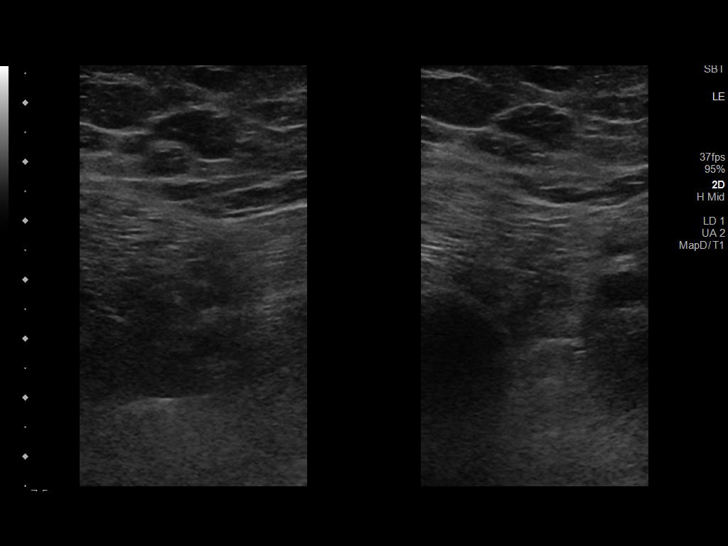
[im 19/55]
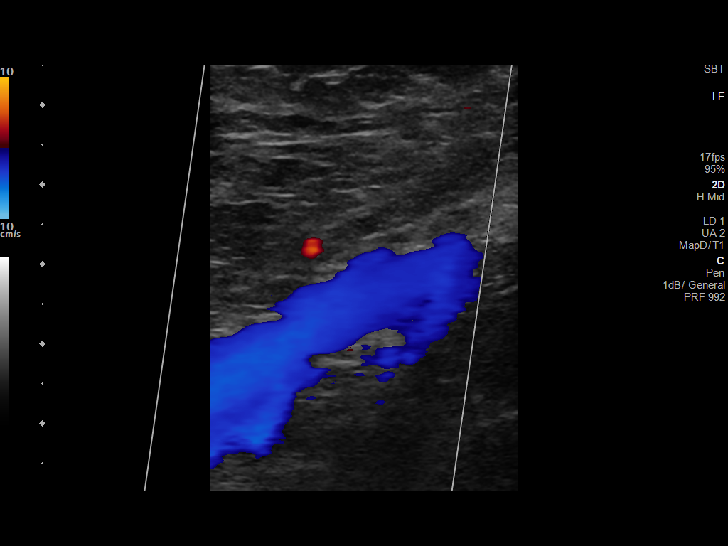
[im 24/55]
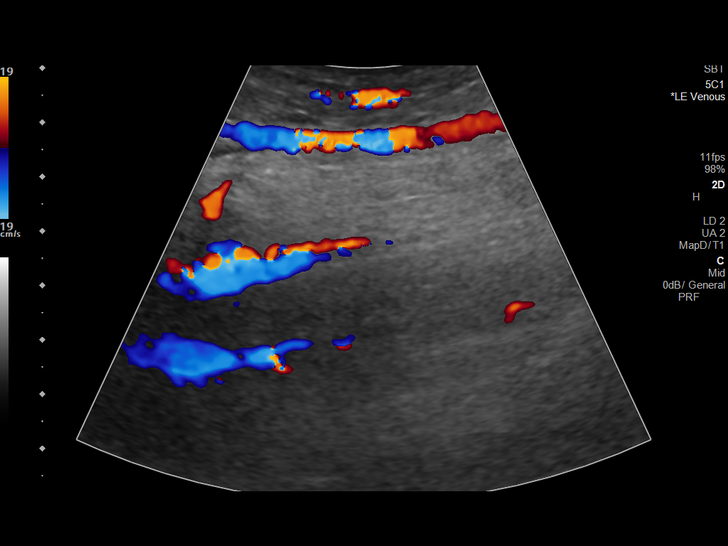
[im 29/55]
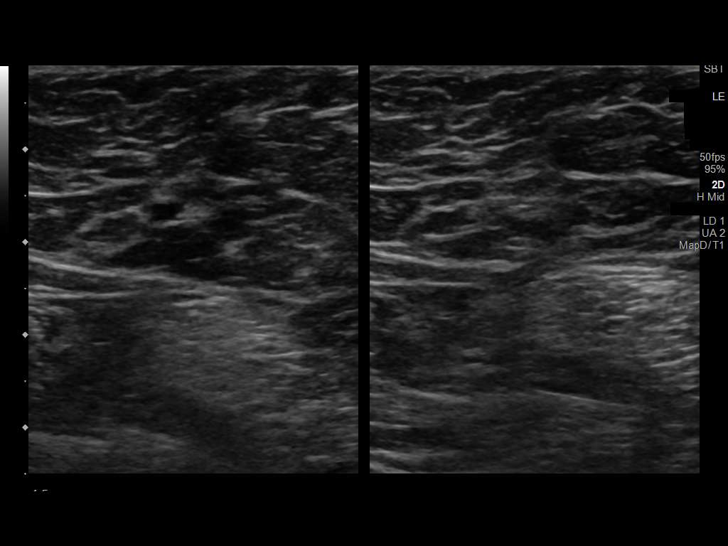
[im 31/55]
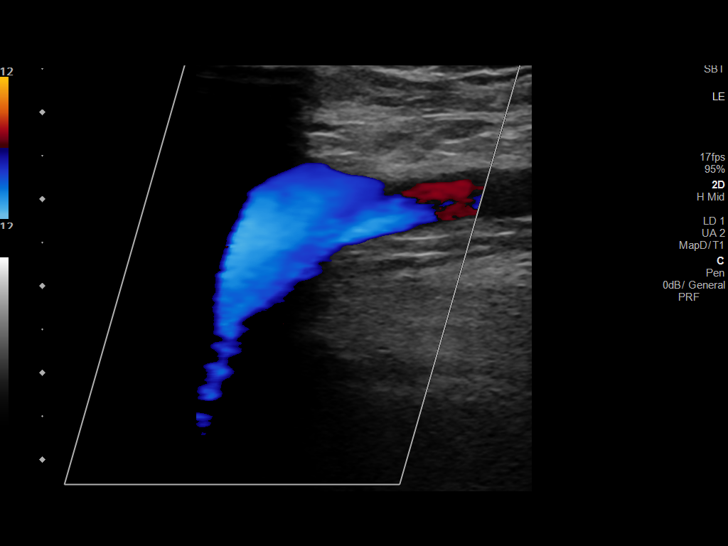
[im 36/55]
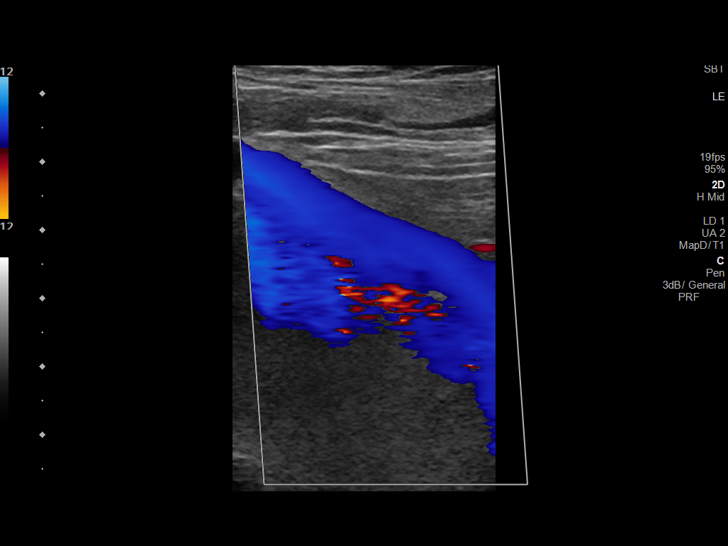
[im 40/55]
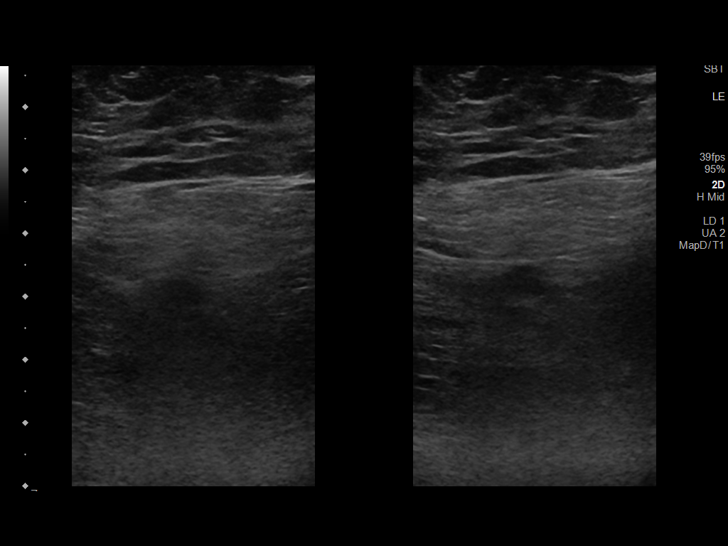
[im 45/55]
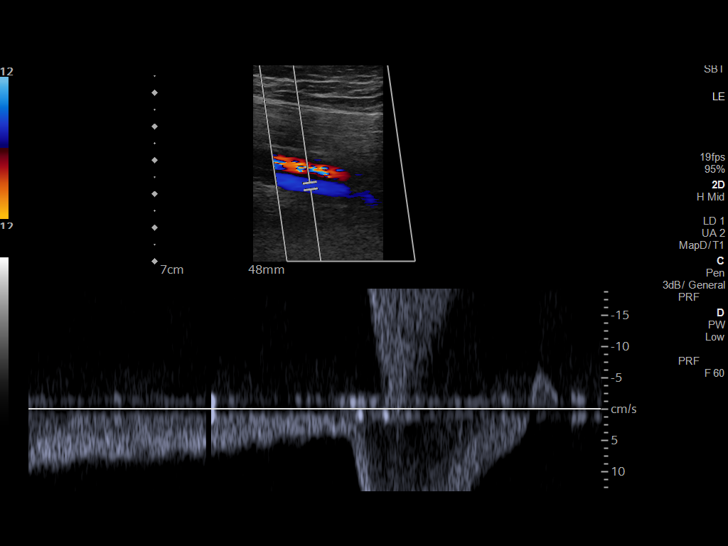
[im 50/55]
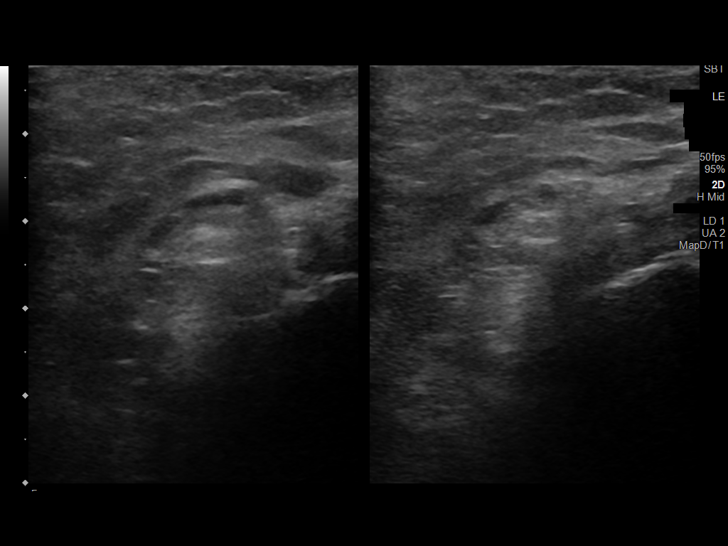
[im 55/55]
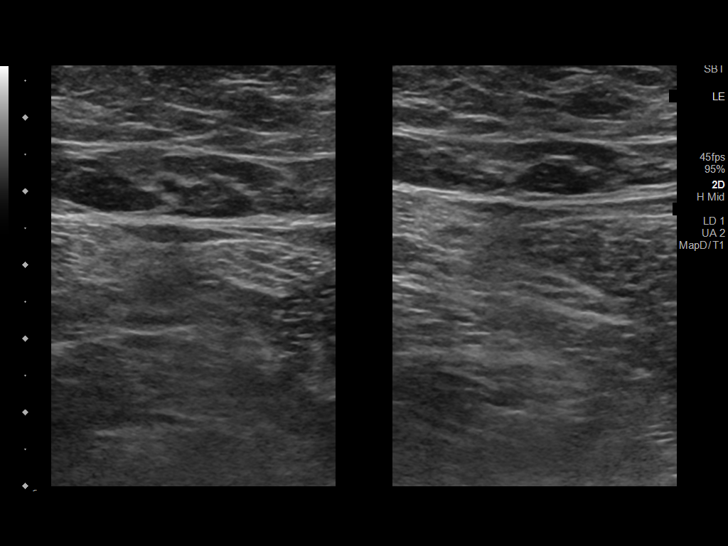

[13 of 24 positions shown; findings below may reference images not displayed]

FINDINGS: RIGHT LOWER EXTREMITY

Common Femoral Vein: No evidence of thrombus. Normal
compressibility, respiratory phasicity and response to augmentation.

Saphenofemoral Junction: No evidence of thrombus. Normal
compressibility and flow on color Doppler imaging.

Profunda Femoral Vein: No evidence of thrombus. Normal
compressibility and flow on color Doppler imaging.

Femoral Vein: No evidence of thrombus. Normal compressibility,
respiratory phasicity and response to augmentation.

Popliteal Vein: No evidence of thrombus. Normal compressibility,
respiratory phasicity and response to augmentation.

Calf Veins: No evidence of thrombus. Normal compressibility and flow
on color Doppler imaging.

Superficial Great Saphenous Vein: No evidence of thrombus. Normal
compressibility.

Venous Reflux:  None.

Other Findings:  None.

LEFT LOWER EXTREMITY

Common Femoral Vein: No evidence of thrombus. Normal
compressibility, respiratory phasicity and response to augmentation.

Saphenofemoral Junction: No evidence of thrombus. Normal
compressibility and flow on color Doppler imaging.

Profunda Femoral Vein: No evidence of thrombus. Normal
compressibility and flow on color Doppler imaging.

Femoral Vein: No evidence of thrombus. Normal compressibility,
respiratory phasicity and response to augmentation.

Popliteal Vein: No evidence of thrombus. Normal compressibility,
respiratory phasicity and response to augmentation.

Calf Veins: The peroneal vein was not visualized. No definite DVT
visualized in the remaining calf veins.

Superficial Great Saphenous Vein: No evidence of thrombus. Normal
compressibility.

Venous Reflux:  None.

Other Findings:  None.
IMPRESSION: No evidence of deep venous thrombosis in either lower extremity.

## 2018-12-15 IMAGING — DX DG CHEST 2V
2 series · 2 of 2 positions shown · non-contrast
Comparison: [DATE]

CLINICAL DATA: Bilateral lower extremity edema. Shortness of breath
on exertion.

EXAM:
CHEST - 2 VIEW

[chest pa]
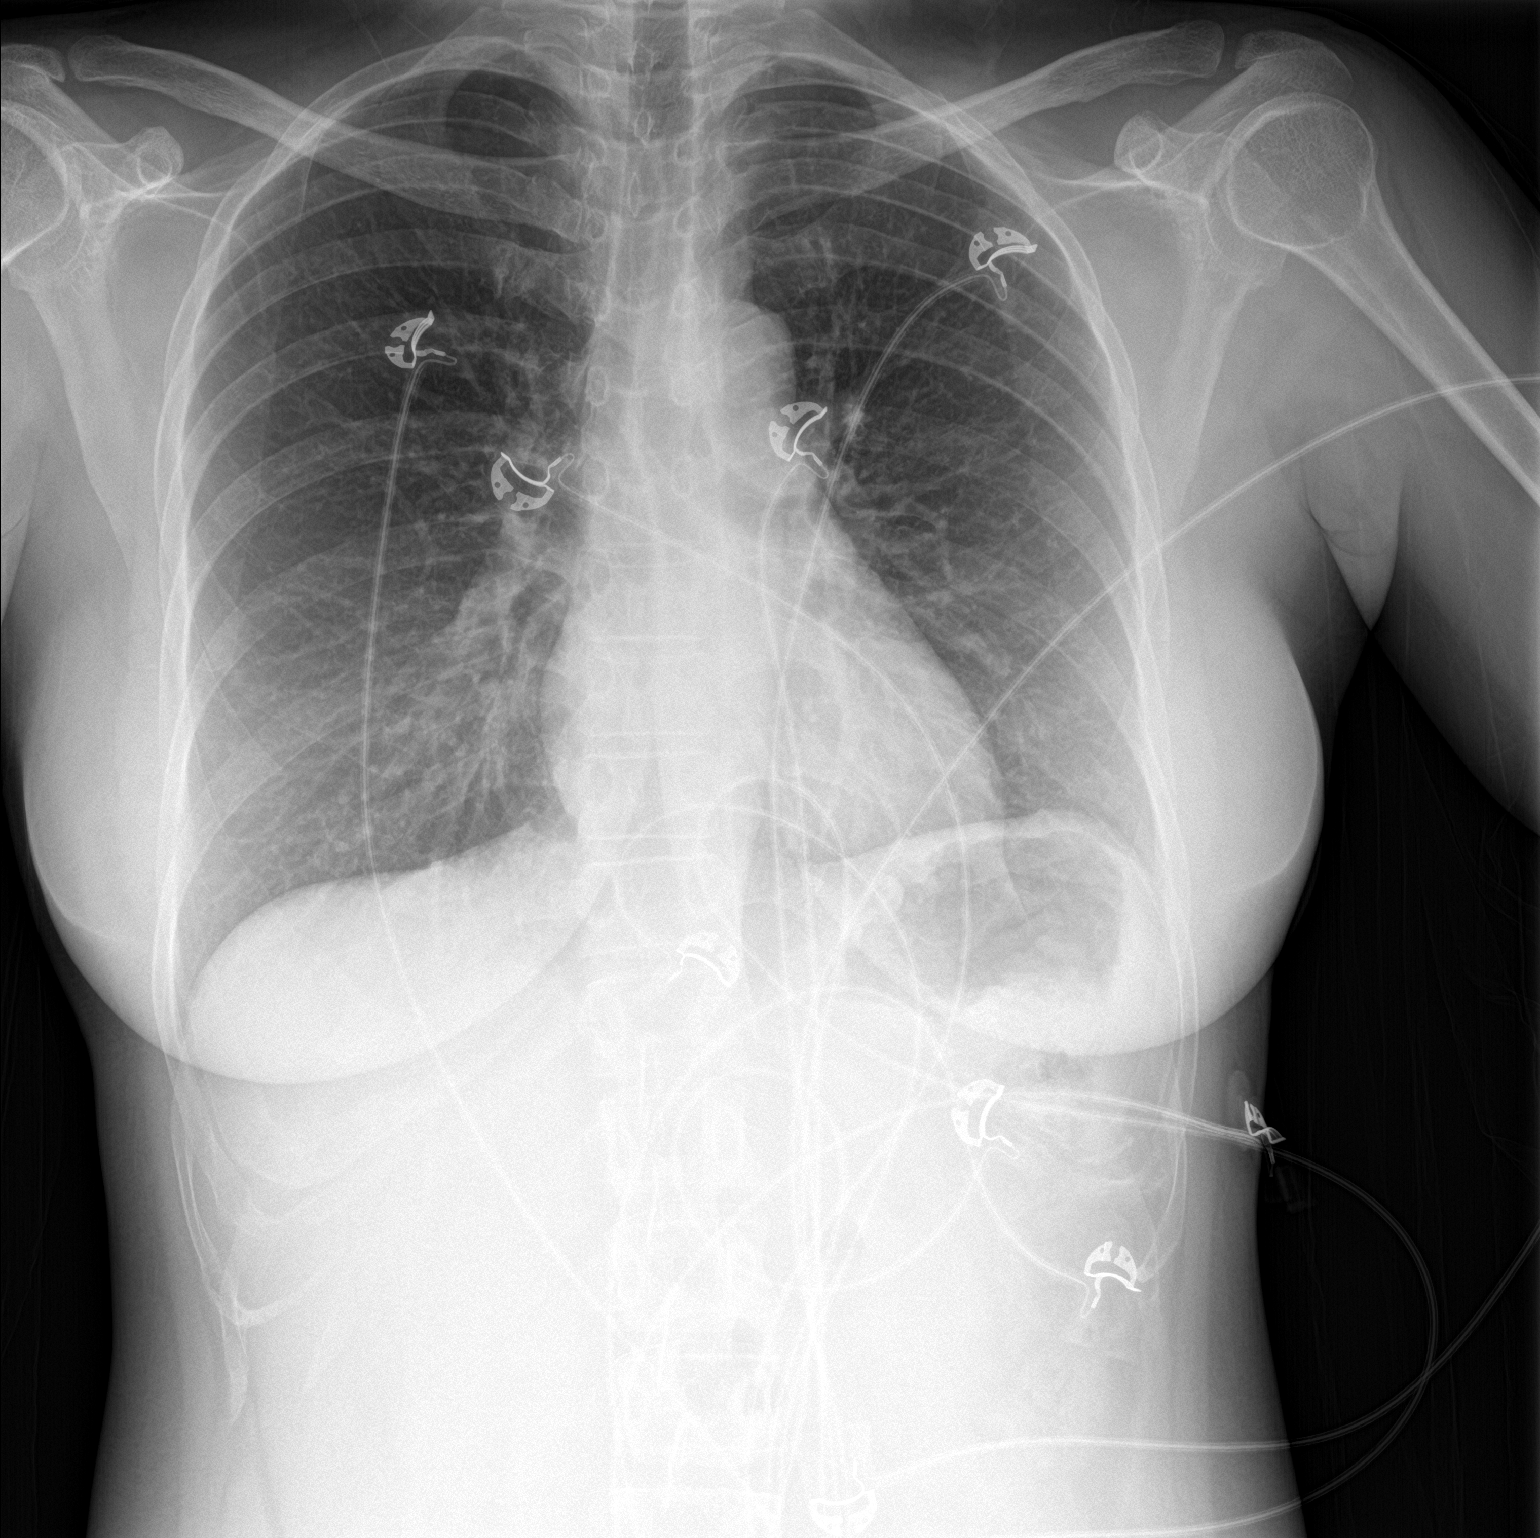

[chest lat]
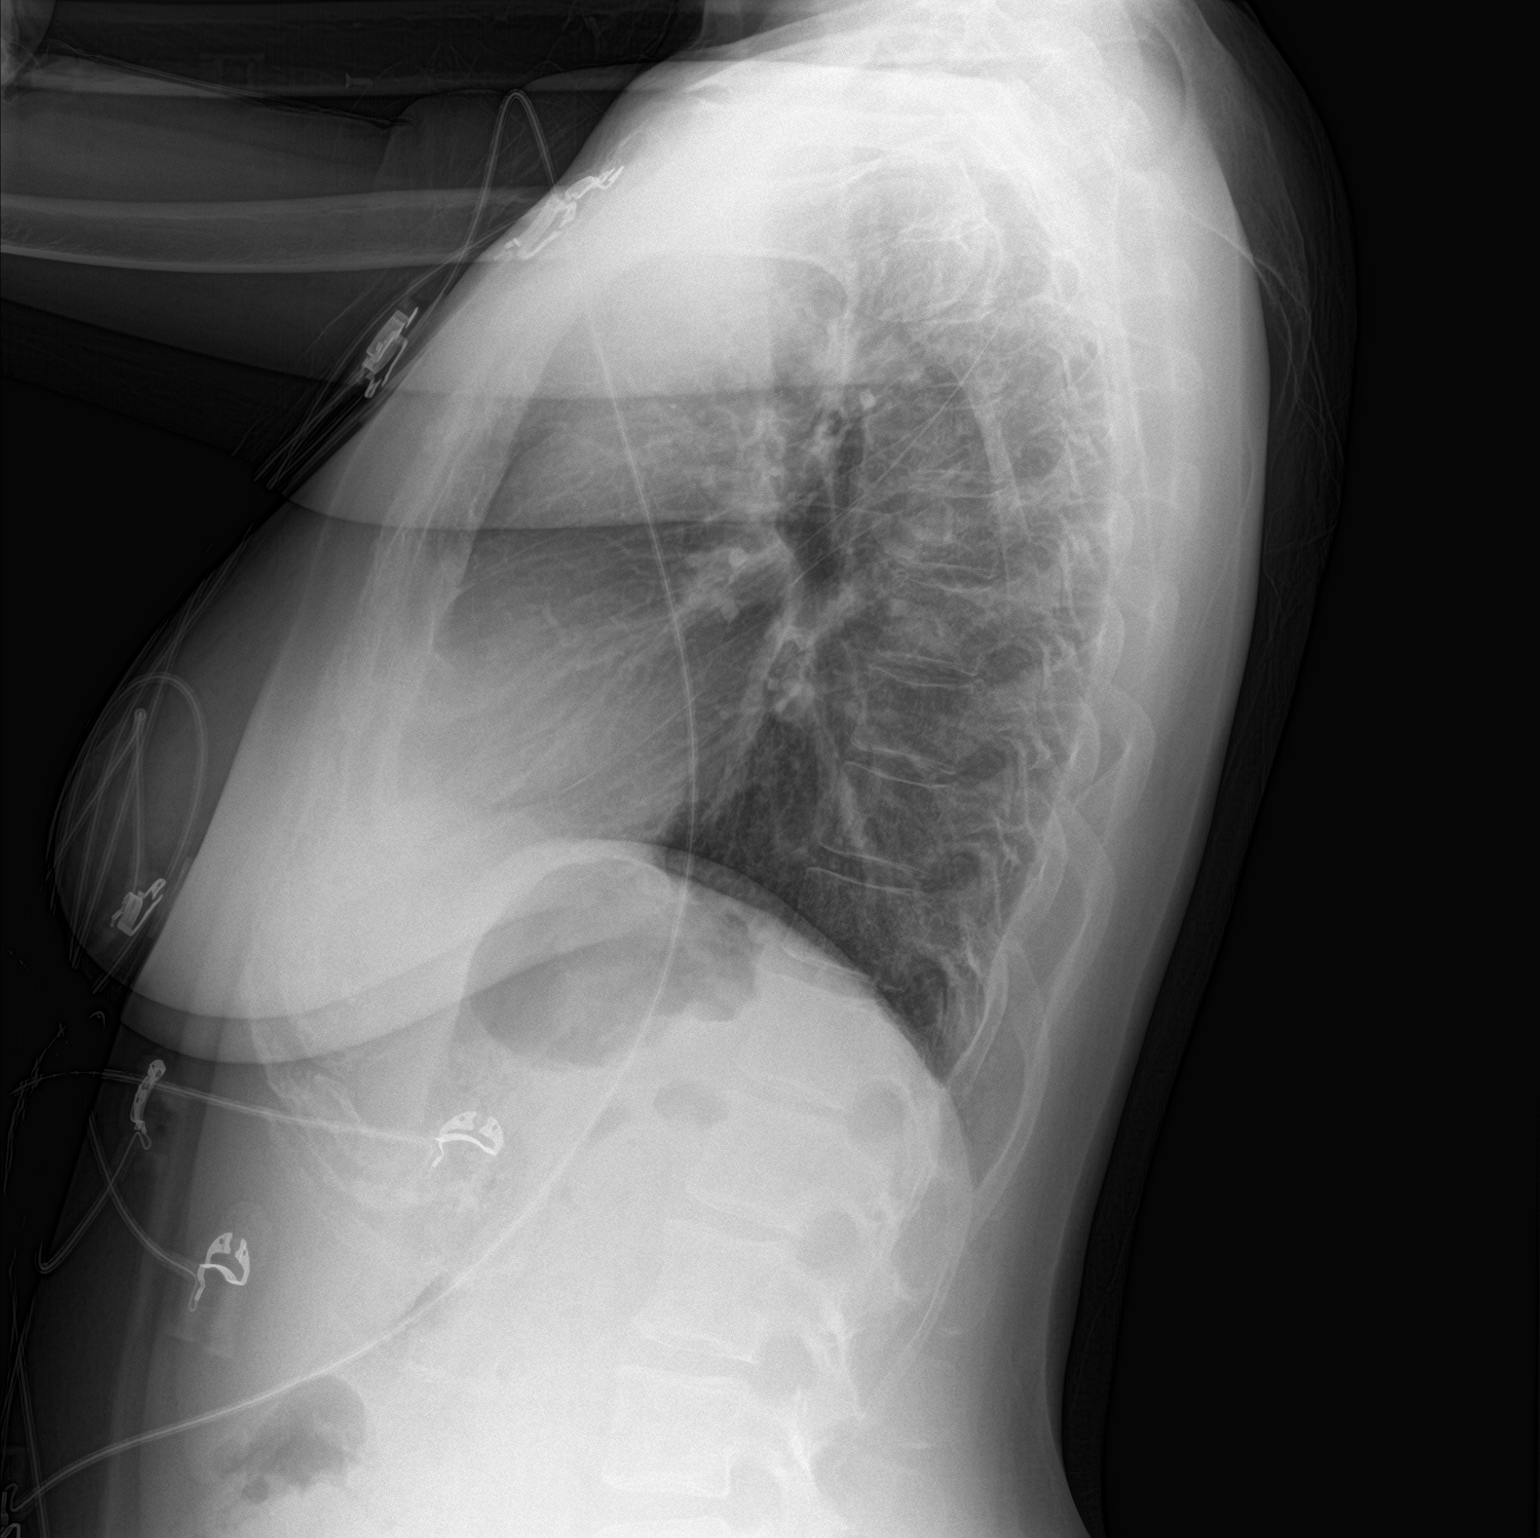

[2 of 2 positions shown; findings below may reference images not displayed]

FINDINGS: Cardiomediastinal silhouette is normal. Mediastinal contours appear
intact.

There is no evidence of focal airspace consolidation, pleural
effusion or pneumothorax.

Osseous structures are without acute abnormality. Soft tissues are
grossly normal.
IMPRESSION: No active cardiopulmonary disease.

## 2018-12-15 NOTE — Discharge Instructions (Signed)
Increase your Lasix in the morning to 60 mg and take 40 mg at night.  I recommend using compression stockings to your legs.  Please follow-up with your doctor as scheduled on Friday for further management of your swelling.  Please return to the emergency department you develop any new or worsening symptoms including severe shortness of breath, chest pain, fever, or any other concerning symptoms.

## 2018-12-15 NOTE — ED Notes (Signed)
Pt ambulated to restroom. 

## 2018-12-15 NOTE — ED Notes (Signed)
Pt states having bilat lower extremity edema for past 2 weeks, causing pain up into thighs. Spoke to PMD who suggested she take her lasix daily instead of PRN, pt reports having taken lasix past 2 weeks without any changes in swelling.

## 2018-12-15 NOTE — ED Triage Notes (Signed)
Bilateral ankle swelling. Pt states over 1 month. Seen by PMD last Wed for same. States more painful today. PT has appt with MD again on Friday. Ambulatory.

## 2018-12-15 NOTE — ED Provider Notes (Signed)
[facility] Provider Note   CSN: [id number] Arrival date & time: 12/15/18  1808     History   Chief Complaint Chief Complaint  Patient presents with   Leg Swelling    HPI Zoe Clay is a 43 y.o. female with history of schizophrenia, bipolar 1 disorder, diabetes, hypertension, bilateral lymphedema who presents with a one-month history of worsening lower extremity swelling.  She is states she has had some intermittent shortness of breath and sharp, fleeting chest pain when she walks, that she describes as mild.  She describes the shortness of breath is more just tiredness from her legs swelling.  She has been taking her Lasix twice daily (40 mg) and has seen no improvement.  She has had some mild pain from the tightness, but no significant pain.  Patient denies any abdominal pain, nausea, vomiting.  She denies any chest pain now.     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 Authorizing Provider  busPIRone (BUSPAR)  5 MG tablet Take 5 mg by mouth 2 (two) times daily.    [provider]  DULoxetine (CYMBALTA) 60 MG capsule Take 60 mg by mouth daily.    [provider]  fluconazole (DIFLUCAN) 150 MG tablet Take 1 tablet (150 mg total) by mouth daily. 06/01/18   Petrucelli, Samantha R, PA-C  gabapentin (NEURONTIN) 600 MG tablet Take 1 tablet (600 mg total) by mouth 2 (two) times daily. 01/10/15   Katheren Shams, DO  hydrOXYzine (ATARAX/VISTARIL) 25 MG tablet Take 1 tablet (25 mg total) by mouth every 6 (six) hours. Patient not taking: Reported on 01/01/2018 12/13/15   Joy, Raquel Sarna C, PA-C  insulin aspart (NOVOLOG) 100 UNIT/ML injection Inject into the skin 3 (three) times daily before meals.    [provider]  insulin glargine (LANTUS) 100 UNIT/ML injection Inject 15 Units into the skin daily. 06/22/12   Regalado, Belkys A, MD  levothyroxine (SYNTHROID, LEVOTHROID) 125 MCG tablet Take 125 mcg by mouth daily.      [provider]  lisinopril (PRINIVIL,ZESTRIL) 5 MG tablet Take 5 mg by mouth daily.    [provider]  naproxen (NAPROSYN) 500 MG tablet Take 1 tablet (500 mg total) by  mouth 2 (two) times daily. 04/11/17   Petrucelli, Glynda Jaeger, PA-C  phenytoin (DILANTIN) 100 MG ER capsule Take 200 mg by mouth 2 (two) times daily.     [provider]  phenytoin (DILANTIN) 200 MG ER capsule Take 1 capsule (200 mg total) by mouth 2 (two) times daily. 08/21/16 08/31/16  Katheren Shams, DO  risperiDONE (RISPERDAL) 2 MG tablet Take 2 mg by mouth 2 (two) times daily.    [provider]  risperiDONE (RISPERDAL) 2 MG tablet Take 1 tablet (2 mg total) by mouth 2 (two) times daily. 08/21/16 08/31/16  Katheren Shams, DO  simvastatin (ZOCOR) 20 MG tablet Take 20 mg by mouth every evening.    [provider]  traZODone (DESYREL) 100 MG tablet Take 100 mg by mouth at bedtime.    [provider]  traZODone (DESYREL) 100 MG tablet Take 1 tablet (100 mg total)  by mouth at bedtime. Patient not taking: Reported on 01/01/2018 08/21/16   Katheren Shams, DO    Family History Family History  Problem Relation Age of Onset   Diabetes Father    Cancer Maternal Grandfather     Social History Social History   Tobacco Use   Smoking status: Current Every Day Smoker    Packs/day: 0.25    Types: Cigarettes   Smokeless tobacco: Never Used  Substance Use Topics   Alcohol use: No   Drug use: No     Allergies   Contrast media [iodinated diagnostic agents] and Iohexol   Review of Systems Review of Systems  Constitutional: Negative for chills and fever.  HENT: Negative for facial swelling and sore throat.   Respiratory: Positive for shortness of breath (only with ambulation, mild).   Cardiovascular: Positive for chest pain (none now, occasionally with ambulation) and leg swelling.  Gastrointestinal: Negative for abdominal pain, nausea and vomiting.  Genitourinary: Negative for dysuria.  Musculoskeletal: Negative for back pain.  Skin: Negative for rash and wound.  Neurological: Negative for headaches.  Psychiatric/Behavioral: The patient is not nervous/anxious.      Physical Exam Updated Vital Signs BP 130/82 (BP Location: Right Arm)    Pulse 84    Temp 98.7 F (37.1 C) (Oral)    Resp 18    Ht 5\' 5"  (1.651 m)    Wt 78.9 kg    LMP  (LMP Unknown)    SpO2 99%    BMI 28.96 kg/m   Physical Exam Vitals signs and nursing note reviewed.  Constitutional:      General: She is not in acute distress.    Appearance: She is well-developed. She is not diaphoretic.  HENT:     Head: Normocephalic and atraumatic.     Mouth/Throat:     Pharynx: No oropharyngeal exudate.  Eyes:     General: No scleral icterus.       Right eye: No discharge.        Left eye: No discharge.     Conjunctiva/sclera: Conjunctivae normal.     Pupils: Pupils are equal, round, and reactive to light.  Neck:     Musculoskeletal: Normal range of motion and neck supple.      Thyroid: No thyromegaly.  Cardiovascular:     Rate and Rhythm: Normal rate and regular rhythm.     Heart sounds: Normal heart sounds. No murmur. No friction rub. No gallop.   Pulmonary:     Effort: Pulmonary effort is normal. No respiratory distress.     Breath  sounds: Normal breath sounds. No stridor. No wheezing or rales.  Abdominal:     General: Bowel sounds are normal. There is no distension.     Palpations: Abdomen is soft.     Tenderness: There is no abdominal tenderness. There is no guarding or rebound.  Musculoskeletal:        General: No tenderness.     Right lower leg: Edema (3+) present.     Left lower leg: Edema (3+) present.  Lymphadenopathy:     Cervical: No cervical adenopathy.  Skin:    General: Skin is warm and dry.     Coloration: Skin is not pale.     Findings: No rash.  Neurological:     Mental Status: She is alert.     Coordination: Coordination normal.      ED Treatments / Results  Labs (all labs ordered are listed, but only abnormal results are displayed) Labs Reviewed  CBC WITH DIFFERENTIAL/PLATELET - Abnormal; Notable for the following components:      Result Value   RBC 3.54 (*)    Hemoglobin 11.0 (*)    HCT 33.9 (*)    RDW 16.5 (*)    All other components within normal limits  BASIC METABOLIC PANEL - Abnormal; Notable for the following components:   Sodium 131 (*)    CO2 19 (*)    Glucose, Bld 219 (*)    All other components within normal limits  PHENYTOIN LEVEL, TOTAL - Abnormal; Notable for the following components:   Phenytoin Lvl <2.5 (*)    All other components within normal limits  BRAIN NATRIURETIC PEPTIDE  TROPONIN I (HIGH SENSITIVITY)  TROPONIN I (HIGH SENSITIVITY)    EKG EKG Interpretation  Date/Time:  Wednesday December 15 2018 20:10:18 EDT Ventricular Rate:  93 PR Interval:    QRS Duration: 89 QT Interval:  343 QTC Calculation: 427 R Axis:   59 Text Interpretation:  Sinus rhythm Probable anteroseptal infarct, old  No significant change since last tracing Confirmed by Blanchie Dessert [phone]) on 12/15/2018 8:47:33 PM   Radiology Dg Chest 2 View  Result Date: 12/15/2018 CLINICAL DATA:  Bilateral lower extremity edema. Shortness of breath on exertion. EXAM: CHEST - 2 VIEW COMPARISON:  April 24, 2018 FINDINGS: Cardiomediastinal silhouette is normal. Mediastinal contours appear intact. There is no evidence of focal airspace consolidation, pleural effusion or pneumothorax. Osseous structures are without acute abnormality. Soft tissues are grossly normal. IMPRESSION: No active cardiopulmonary disease. Electronically Signed   By: Fidela Salisbury M.D.   On: 12/15/2018 20:33   US Venous Img Lower Bilateral  Result Date: 12/15/2018 CLINICAL DATA:  Bilateral lower extremity edema EXAM: BILATERAL LOWER EXTREMITY VENOUS DOPPLER ULTRASOUND TECHNIQUE: Gray-scale sonography with graded compression, as well as color Doppler and duplex ultrasound were performed to evaluate the lower extremity deep venous systems from the level of the common femoral vein and including the common femoral, femoral, profunda femoral, popliteal and calf veins including the posterior tibial, peroneal and gastrocnemius veins when visible. The superficial great saphenous vein was also interrogated. Spectral Doppler was utilized to evaluate flow at rest and with distal augmentation maneuvers in the common femoral, femoral and popliteal veins. COMPARISON:  None. FINDINGS: RIGHT LOWER EXTREMITY Common Femoral Vein: No evidence of thrombus. Normal compressibility, respiratory phasicity and response to augmentation. Saphenofemoral Junction: No evidence of thrombus. Normal compressibility and flow on color Doppler imaging. Profunda Femoral Vein: No evidence of thrombus. Normal compressibility and flow on color Doppler imaging. Femoral Vein: No evidence  of thrombus. Normal compressibility, respiratory phasicity and response to augmentation. Popliteal Vein: No  evidence of thrombus. Normal compressibility, respiratory phasicity and response to augmentation. Calf Veins: No evidence of thrombus. Normal compressibility and flow on color Doppler imaging. Superficial Great Saphenous Vein: No evidence of thrombus. Normal compressibility. Venous Reflux:  None. Other Findings:  None. LEFT LOWER EXTREMITY Common Femoral Vein: No evidence of thrombus. Normal compressibility, respiratory phasicity and response to augmentation. Saphenofemoral Junction: No evidence of thrombus. Normal compressibility and flow on color Doppler imaging. Profunda Femoral Vein: No evidence of thrombus. Normal compressibility and flow on color Doppler imaging. Femoral Vein: No evidence of thrombus. Normal compressibility, respiratory phasicity and response to augmentation. Popliteal Vein: No evidence of thrombus. Normal compressibility, respiratory phasicity and response to augmentation. Calf Veins: The peroneal vein was not visualized. No definite DVT visualized in the remaining calf veins. Superficial Great Saphenous Vein: No evidence of thrombus. Normal compressibility. Venous Reflux:  None. Other Findings:  None. IMPRESSION: No evidence of deep venous thrombosis in either lower extremity. Electronically Signed   By: Constance Holster M.D.   On: 12/15/2018 22:07    Procedures Procedures (including critical care time)  Medications Ordered in ED Medications - No data to display   Initial Impression / Assessment and Plan / ED Course  I have reviewed the triage vital signs and the nursing notes.  Pertinent labs & imaging results that were available during my care of the patient were reviewed by me and considered in my medical decision making (see chart for details).        Patient reporting a one-month history of worsening bilateral peripheral edema.  She has mild pain mostly due to the tightness.  Labs are stable for the patient's with hemoglobin 11.0, sodium 131, glucose 219.  Bilateral  Doppler ultrasounds are negative for DVT.  Chest x-ray is clear.  BNP and troponin are within normal limits.  Phenytoin level low.  Will increase Lasix to 60 mg in the morning and 40 mg at night.  Patient has a follow-up appointment their PCP in 2 days, advise further management of diuresis then.  No indication for emergent diuresis or admission today.  Low suspicion of PE or ACS.  EKG shows NSR with no change since last tracing.  Return precautions discussed.  Patient understands and agrees with plan.  Patient vitals stable throughout ED course and discharged in satisfactory condition. I discussed patient case with Dr. Maryan Rued who guided the patient's management and agrees with plan.   Final Clinical Impressions(s) / ED Diagnoses   Final diagnoses:  Peripheral edema    ED Discharge Orders    None       Frederica Kuster, Hershal Coria 12/15/18 2219    Blanchie Dessert, MD 12/15/18 [phone]

## 2019-01-21 ENCOUNTER — Other Ambulatory Visit: Payer: Self-pay

## 2019-01-21 ENCOUNTER — Emergency Department (HOSPITAL_BASED_OUTPATIENT_CLINIC_OR_DEPARTMENT_OTHER)
Admission: EM | Admit: 2019-01-21 | Discharge: 2019-01-21 | Disposition: A | Discharge status: Home / Self Care | Payer: Self-pay | Attending: Emergency Medicine | Admitting: Emergency Medicine

## 2019-01-21 ENCOUNTER — Encounter (HOSPITAL_BASED_OUTPATIENT_CLINIC_OR_DEPARTMENT_OTHER): Payer: Self-pay | Admitting: Emergency Medicine

## 2019-01-21 DIAGNOSIS — E782 Mixed hyperlipidemia: Secondary | ICD-10-CM | POA: Insufficient documentation

## 2019-01-21 DIAGNOSIS — F17210 Nicotine dependence, cigarettes, uncomplicated: Secondary | ICD-10-CM | POA: Insufficient documentation

## 2019-01-21 DIAGNOSIS — I10 Essential (primary) hypertension: Secondary | ICD-10-CM | POA: Insufficient documentation

## 2019-01-21 DIAGNOSIS — Z79899 Other long term (current) drug therapy: Secondary | ICD-10-CM | POA: Insufficient documentation

## 2019-01-21 DIAGNOSIS — Z91041 Radiographic dye allergy status: Secondary | ICD-10-CM | POA: Insufficient documentation

## 2019-01-21 DIAGNOSIS — G40909 Epilepsy, unspecified, not intractable, without status epilepticus: Secondary | ICD-10-CM | POA: Insufficient documentation

## 2019-01-21 DIAGNOSIS — E1065 Type 1 diabetes mellitus with hyperglycemia: Secondary | ICD-10-CM | POA: Insufficient documentation

## 2019-01-21 DIAGNOSIS — Z794 Long term (current) use of insulin: Secondary | ICD-10-CM | POA: Insufficient documentation

## 2019-01-21 DIAGNOSIS — E079 Disorder of thyroid, unspecified: Secondary | ICD-10-CM | POA: Insufficient documentation

## 2019-01-21 DIAGNOSIS — R739 Hyperglycemia, unspecified: Secondary | ICD-10-CM

## 2019-01-21 LAB — BASIC METABOLIC PANEL
Anion gap: 12 (ref 5–15)
BUN: 16 mg/dL (ref 6–20)
CO2: 21 mmol/L — ABNORMAL LOW (ref 22–32)
Calcium: 9.3 mg/dL (ref 8.9–10.3)
Chloride: 99 mmol/L (ref 98–111)
Creatinine, Ser: 0.73 mg/dL (ref 0.44–1.00)
GFR calc Af Amer: >60 mL/min (ref >60)
GFR calc non Af Amer: >60 mL/min (ref >60)
Glucose, Bld: 515 mg/dL (ref 70–99)
Potassium: 3.7 mmol/L (ref 3.5–5.1)
Sodium: 132 mmol/L — ABNORMAL LOW (ref 135–145)

## 2019-01-21 LAB — CBC WITH DIFFERENTIAL/PLATELET
Abs Immature Granulocytes: 0.01 10*3/uL (ref 0.00–0.07)
Basophils Absolute: 0 10*3/uL (ref 0.0–0.1)
Basophils Relative: 0 %
Eosinophils Absolute: 0.1 10*3/uL (ref 0.0–0.5)
Eosinophils Relative: 1 %
HCT: 34 % — ABNORMAL LOW (ref 36.0–46.0)
Hemoglobin: 11.4 g/dL — ABNORMAL LOW (ref 12.0–15.0)
Immature Granulocytes: 0 %
Lymphocytes Relative: 51 %
Lymphs Abs: 4 10*3/uL (ref 0.7–4.0)
MCH: 31.2 pg (ref 26.0–34.0)
MCHC: 33.5 g/dL (ref 30.0–36.0)
MCV: 93.2 fL (ref 80.0–100.0)
Monocytes Absolute: 0.4 10*3/uL (ref 0.1–1.0)
Monocytes Relative: 6 %
Neutro Abs: 3.2 10*3/uL (ref 1.7–7.7)
Neutrophils Relative %: 42 %
Platelets: 357 10*3/uL (ref 150–400)
RBC: 3.65 MIL/uL — ABNORMAL LOW (ref 3.87–5.11)
RDW: 13.9 % (ref 11.5–15.5)
WBC: 7.8 10*3/uL (ref 4.0–10.5)
nRBC: 0 % (ref 0.0–0.2)

## 2019-01-21 LAB — CBG MONITORING, ED
Glucose-Capillary: 110 mg/dL — ABNORMAL HIGH (ref 70–99)
Glucose-Capillary: 116 mg/dL — ABNORMAL HIGH (ref 70–99)
Glucose-Capillary: 216 mg/dL — ABNORMAL HIGH (ref 70–99)
Glucose-Capillary: 419 mg/dL — ABNORMAL HIGH (ref 70–99)
Glucose-Capillary: 553 mg/dL (ref 70–99)
Glucose-Capillary: 77 mg/dL (ref 70–99)

## 2019-01-21 LAB — PREGNANCY, URINE: Preg Test, Ur: NEGATIVE

## 2019-01-21 MED ORDER — INSULIN REGULAR HUMAN 100 UNIT/ML IJ SOLN
6.0000 [IU] | Freq: Once | INTRAMUSCULAR | Status: AC
  Administered 2019-01-21: 01:00:00 6 [IU] via INTRAVENOUS
  Filled 2019-01-21: qty 1

## 2019-01-21 MED ORDER — SODIUM CHLORIDE 0.9 % IV BOLUS
1000.0000 mL | Freq: Once | INTRAVENOUS | Status: AC
  Administered 2019-01-21: 01:00:00 1000 mL via INTRAVENOUS

## 2019-01-21 MED FILL — Insulin Regular (Human) Inj 100 Unit/ML: INTRAMUSCULAR | Qty: 0.06 | Status: AC

## 2019-01-21 NOTE — ED Notes (Signed)
Gave patient teddy graham cookies and encouraged her to eat

## 2019-01-21 NOTE — ED Triage Notes (Signed)
Patient presents with complaints of high blood glucose this evening; states meter reading HI; states took her sliding scale insulin pta. Patient denies NVD; states intermittent dizziness. Ambulatory with steady gait.

## 2019-01-21 NOTE — ED Notes (Signed)
Gave patient crackers and gingerale

## 2019-01-21 NOTE — ED Notes (Signed)
Date and time results received: 01/21/19 0120 (use smartphrase ".now" to insert current time)  Test: glucose Critical Value: 0515  Name of Provider Notified: Dr. Randal Buba  Orders Received? Or Actions Taken?: new orders received

## 2019-01-21 NOTE — ED Provider Notes (Signed)
**Zoe Zoe** Zoe Zoe   CSN: [id number] Arrival date & time: 01/21/19  0011     History   Chief Complaint Chief Complaint  Patient presents with  . Hyperglycemia    HPI Zoe Zoe is a 43 y.o. female.     The history is provided by the patient.  Hyperglycemia Blood sugar level PTA:  High Severity:  Severe Onset quality:  Gradual Timing:  Constant Progression:  Unchanged Chronicity:  Recurrent Diabetes status:  Controlled with insulin Current diabetic therapy:  Sliding scale and levemir Time since last antidiabetic medication:  3 hours Context: not change in medication and not insulin pump use   Context comment:  Eating sugar cookies Relieved by:  Nothing Ineffective treatments:  Insulin Associated symptoms: no abdominal pain, no altered mental status, no blurred vision, no chest pain, no confusion, no dehydration, no diaphoresis, no dizziness, no dysuria, no fatigue, no fever, no increased appetite, no increased thirst, no malaise, no nausea, no polyuria, no shortness of breath, no syncope, no vomiting, no weakness and no weight change   Risk factors: no pancreatic disease   Patient with type 1 diabetes presents with hyperglycemia after eating sugar cookies this evening.  Was in the 300s during the day and then eating cookies and read high.  Took levemir after.    Past Medical History:  Diagnosis Date  . Bipolar 1 disorder ([place])   . Diabetes mellitus   . High cholesterol   . Hypertension   . Peripheral neuropathy   . Schizophrenia, acute ([place])   . Seizures ([place])    last seizure May 2016  . Thyroid disease     Patient Active Problem List   Diagnosis Date Noted  . DKA (diabetic ketoacidoses) ([place]) 06/20/2012  . Nausea with vomiting 06/20/2012  . 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 Seizure disorder ([place]) 06/20/2012  . Unspecified hypothyroidism 06/20/2012  . Hyponatremia  06/20/2012    Past Surgical History:  Procedure Laterality Date  . ANKLE SURGERY Bilateral 2009  . TUBAL LIGATION  1999     OB History    Gravida  4   Para  3   Term  3   Preterm  0   AB  1   Living  3     SAB  1   TAB  0   Ectopic  0   Multiple  0   Live Births  3            Home Medications    Prior to Admission medications   Medication Sig Start Date End Date Taking? Authorizing Provider  busPIRone (BUSPAR) 5 MG tablet Take 5 mg by mouth 2 (two) times daily.    [provider]  DULoxetine (CYMBALTA) 60 MG capsule Take 60 mg by mouth daily.    [provider]  fluconazole (DIFLUCAN) 150 MG tablet Take 1 tablet (150 mg total) by mouth daily. 06/01/18   Petrucelli, Samantha R, PA-C  gabapentin (NEURONTIN) 600 MG tablet Take 1 tablet (600 mg total) by mouth 2 (two) times daily. 01/10/15   Katheren Shams, DO  hydrOXYzine (ATARAX/VISTARIL) 25 MG tablet Take 1 tablet (25 mg total) by mouth every 6 (six) hours. Patient not taking: Reported on 01/01/2018 12/13/15   Joy, Raquel Sarna C, PA-C  insulin aspart (NOVOLOG) 100 UNIT/ML injection Inject into the skin 3 (three) times daily before meals.    [provider]  insulin glargine (LANTUS)  100 UNIT/ML injection Inject 15 Units into the skin daily. 06/22/12   Regalado, Belkys A, MD  levothyroxine (SYNTHROID, LEVOTHROID) 125 MCG tablet Take 125 mcg by mouth daily.      [provider]  lisinopril (PRINIVIL,ZESTRIL) 5 MG tablet Take 5 mg by mouth daily.    [provider]  naproxen (NAPROSYN) 500 MG tablet Take 1 tablet (500 mg total) by mouth 2 (two) times daily. 04/11/17   Petrucelli, Glynda Jaeger, PA-C  phenytoin (DILANTIN) 100 MG ER capsule Take 200 mg by mouth 2 (two) times daily.     [provider]  phenytoin (DILANTIN) 200 MG ER capsule Take 1 capsule (200 mg total) by mouth 2 (two) times daily. 08/21/16 08/31/16  Katheren Shams, DO  risperiDONE (RISPERDAL) 2 MG tablet Take  2 mg by mouth 2 (two) times daily.    [provider]  risperiDONE (RISPERDAL) 2 MG tablet Take 1 tablet (2 mg total) by mouth 2 (two) times daily. 08/21/16 08/31/16  Katheren Shams, DO  simvastatin (ZOCOR) 20 MG tablet Take 20 mg by mouth every evening.    [provider]  traZODone (DESYREL) 100 MG tablet Take 100 mg by mouth at bedtime.    [provider]  traZODone (DESYREL) 100 MG tablet Take 1 tablet (100 mg total) by mouth at bedtime. Patient not taking: Reported on 01/01/2018 08/21/16   Katheren Shams, DO    Family History Family History  Problem Relation Age of Onset  . Diabetes Father   . Cancer Maternal Grandfather     Social History Social History   Tobacco Use  . Smoking status: Current Every Day Smoker    Packs/day: 0.25    Types: Cigarettes  . Smokeless tobacco: Never Used  Substance Use Topics  . Alcohol use: No  . Drug use: No     Allergies   Contrast media [iodinated diagnostic agents] and Iohexol   Review of Systems Review of Systems  Constitutional: Negative for diaphoresis, fatigue and fever.  HENT: Negative for congestion.   Eyes: Negative for blurred vision and visual disturbance.  Respiratory: Negative for shortness of breath.   Cardiovascular: Negative for chest pain and syncope.  Gastrointestinal: Negative for abdominal pain, nausea and vomiting.  Endocrine: Negative for polydipsia and polyuria.  Genitourinary: Negative for dysuria.  Musculoskeletal: Negative for arthralgias.  Skin: Negative for rash.  Neurological: Negative for dizziness and weakness.  Psychiatric/Behavioral: Negative for confusion.  All other systems reviewed and are negative.    Physical Exam Updated Vital Signs BP 111/83   Pulse 95   Temp 97.7 F (36.5 C) (Oral)   Resp 16   Ht 5\' 5"  (1.651 m)   Wt 78.9 kg   LMP 12/10/2018   SpO2 100%   BMI 28.95 kg/m   Physical Exam Vitals signs and nursing Zoe reviewed.  Constitutional:       General: She is not in acute distress.    Appearance: She is normal weight.  HENT:     Head: Normocephalic and atraumatic.     Nose: Nose normal.  Eyes:     Conjunctiva/sclera: Conjunctivae normal.     Pupils: Pupils are equal, round, and reactive to light.  Neck:     Musculoskeletal: Normal range of motion and neck supple.  Cardiovascular:     Rate and Rhythm: Normal rate and regular rhythm.     Pulses: Normal pulses.  Pulmonary:     Effort: Pulmonary effort is normal.  Breath sounds: Normal breath sounds.  Abdominal:     General: Abdomen is flat. Bowel sounds are normal.     Tenderness: There is no abdominal tenderness. There is no guarding or rebound.  Musculoskeletal: Normal range of motion.  Skin:    General: Skin is warm and dry.     Capillary Refill: Capillary refill takes less than 2 seconds.  Neurological:     General: No focal deficit present.     Mental Status: She is alert and oriented to person, place, and time.     Deep Tendon Reflexes: Reflexes normal.  Psychiatric:        Mood and Affect: Mood normal.        Behavior: Behavior normal.      ED Treatments / Results  Labs (all labs ordered are listed, but only abnormal results are displayed) Results for orders placed or performed during the hospital encounter of [id number]  Basic metabolic panel  Result Value Ref Range   Sodium 132 (L) 135 - 145 mmol/L   Potassium 3.7 3.5 - 5.1 mmol/L   Chloride 99 98 - 111 mmol/L   CO2 21 (L) 22 - 32 mmol/L   Glucose, Bld 515 (HH) 70 - 99 mg/dL   BUN 16 6 - 20 mg/dL   Creatinine, Ser 0.73 0.44 - 1.00 mg/dL   Calcium 9.3 8.9 - 10.3 mg/dL   GFR calc non Af Amer >60 >60 mL/min   GFR calc Af Amer >60 >60 mL/min   Anion gap 12 5 - 15  CBC with Differential/Platelet  Result Value Ref Range   WBC 7.8 4.0 - 10.5 K/uL   RBC 3.65 (L) 3.87 - 5.11 MIL/uL   Hemoglobin 11.4 (L) 12.0 - 15.0 g/dL   HCT 34.0 (L) 36.0 - 46.0 %   MCV 93.2 80.0 - 100.0 fL   MCH 31.2 26.0 - 34.0 pg    MCHC 33.5 30.0 - 36.0 g/dL   RDW 13.9 11.5 - 15.5 %   Platelets 357 150 - 400 K/uL   nRBC 0.0 0.0 - 0.2 %   Neutrophils Relative % 42 %   Neutro Abs 3.2 1.7 - 7.7 K/uL   Lymphocytes Relative 51 %   Lymphs Abs 4.0 0.7 - 4.0 K/uL   Monocytes Relative 6 %   Monocytes Absolute 0.4 0.1 - 1.0 K/uL   Eosinophils Relative 1 %   Eosinophils Absolute 0.1 0.0 - 0.5 K/uL   Basophils Relative 0 %   Basophils Absolute 0.0 0.0 - 0.1 K/uL   Immature Granulocytes 0 %   Abs Immature Granulocytes 0.01 0.00 - 0.07 K/uL  POC CBG, ED  Result Value Ref Range   Glucose-Capillary 553 (HH) 70 - 99 mg/dL   Comment 1 Notify RN   POC CBG, ED  Result Value Ref Range   Glucose-Capillary 419 (H) 70 - 99 mg/dL   Comment 1 Notify RN    Comment 2 Document in Chart    No results found.  Radiology No results found.  Procedures Procedures (including critical care time)  Medications Ordered in ED Medications  sodium chloride 0.9 % bolus 1,000 mL (1,000 mLs Intravenous New Bag/Given 01/21/19 0056)  insulin regular (NOVOLIN R) 100 units/mL injection 6 Units (6 Units Intravenous Given 01/21/19 0128)    Sugar elevated due to carb ingestion.  Given IVF and insulin.  Sugar is now 216 with a normal anion gap. No signs of DKA.  HBA1C is 8.  Do not want to make the  patient hypoglycemic.  Stable for discharge with close follow up.    Zoe Zoe was evaluated in Emergency Department on 01/21/2019 for the symptoms described in the history of present illness. She was evaluated in the context of the global COVID-19 pandemic, which necessitated consideration that the patient might be at risk for infection with the SARS-CoV-2 virus that causes COVID-19. Institutional protocols and algorithms that pertain to the evaluation of patients at risk for COVID-19 are in a state of rapid change based on information released by regulatory bodies including the CDC and federal and state organizations. These policies and algorithms were  followed during the patient's care in the ED.  Final Clinical Impressions(s) / ED Diagnoses   Return for intractable cough, coughing up blood,fevers >100.4 unrelieved by medication, shortness of breath, intractable vomiting, chest pain, shortness of breath, weakness,numbness, changes in speech, facial asymmetry,abdominal pain, passing out,Inability to tolerate liquids or food, cough, altered mental status or any concerns. No signs of systemic illness or infection. The patient is nontoxic-appearing on exam and vital signs are within normal limits.   I have reviewed the triage vital signs and the nursing notes. Pertinent labs &imaging results that were available during my care of the patient were reviewed by me and considered in my medical decision making (see chart for details).After history, exam, and medical workup I feel the patient has beenappropriately medically screened and is safe for discharge home. Pertinent diagnoses were discussed with the patient. Patient was given return precautions.    Seidy Labreck, MD 01/21/19 0225

## 2019-07-20 ENCOUNTER — Emergency Department (HOSPITAL_BASED_OUTPATIENT_CLINIC_OR_DEPARTMENT_OTHER): Payer: Self-pay

## 2019-07-20 ENCOUNTER — Emergency Department (HOSPITAL_BASED_OUTPATIENT_CLINIC_OR_DEPARTMENT_OTHER)
Admission: EM | Admit: 2019-07-20 | Discharge: 2019-07-20 | Disposition: A | Discharge status: Home / Self Care | Payer: Self-pay | Attending: Emergency Medicine | Admitting: Emergency Medicine

## 2019-07-20 ENCOUNTER — Encounter (HOSPITAL_BASED_OUTPATIENT_CLINIC_OR_DEPARTMENT_OTHER): Payer: Self-pay | Admitting: Emergency Medicine

## 2019-07-20 ENCOUNTER — Other Ambulatory Visit: Payer: Self-pay

## 2019-07-20 DIAGNOSIS — R0602 Shortness of breath: Secondary | ICD-10-CM | POA: Insufficient documentation

## 2019-07-20 DIAGNOSIS — E039 Hypothyroidism, unspecified: Secondary | ICD-10-CM | POA: Insufficient documentation

## 2019-07-20 DIAGNOSIS — R2243 Localized swelling, mass and lump, lower limb, bilateral: Secondary | ICD-10-CM | POA: Insufficient documentation

## 2019-07-20 DIAGNOSIS — Z7982 Long term (current) use of aspirin: Secondary | ICD-10-CM | POA: Insufficient documentation

## 2019-07-20 DIAGNOSIS — F17210 Nicotine dependence, cigarettes, uncomplicated: Secondary | ICD-10-CM | POA: Insufficient documentation

## 2019-07-20 DIAGNOSIS — I10 Essential (primary) hypertension: Secondary | ICD-10-CM | POA: Insufficient documentation

## 2019-07-20 DIAGNOSIS — Z79899 Other long term (current) drug therapy: Secondary | ICD-10-CM | POA: Insufficient documentation

## 2019-07-20 DIAGNOSIS — R739 Hyperglycemia, unspecified: Secondary | ICD-10-CM

## 2019-07-20 DIAGNOSIS — Z794 Long term (current) use of insulin: Secondary | ICD-10-CM | POA: Insufficient documentation

## 2019-07-20 DIAGNOSIS — R5383 Other fatigue: Secondary | ICD-10-CM | POA: Insufficient documentation

## 2019-07-20 DIAGNOSIS — E1065 Type 1 diabetes mellitus with hyperglycemia: Secondary | ICD-10-CM | POA: Insufficient documentation

## 2019-07-20 DIAGNOSIS — R072 Precordial pain: Secondary | ICD-10-CM | POA: Insufficient documentation

## 2019-07-20 LAB — BASIC METABOLIC PANEL
Anion gap: 15 (ref 5–15)
BUN: 18 mg/dL (ref 6–20)
CO2: 19 mmol/L — ABNORMAL LOW (ref 22–32)
Calcium: 9.4 mg/dL (ref 8.9–10.3)
Chloride: 101 mmol/L (ref 98–111)
Creatinine, Ser: 0.95 mg/dL (ref 0.44–1.00)
GFR calc Af Amer: >60 mL/min (ref >60)
GFR calc non Af Amer: >60 mL/min (ref >60)
Glucose, Bld: 411 mg/dL — ABNORMAL HIGH (ref 70–99)
Potassium: 4.1 mmol/L (ref 3.5–5.1)
Sodium: 135 mmol/L (ref 135–145)

## 2019-07-20 LAB — CBC
HCT: 37.9 % (ref 36.0–46.0)
Hemoglobin: 12.1 g/dL (ref 12.0–15.0)
MCH: 29.9 pg (ref 26.0–34.0)
MCHC: 31.9 g/dL (ref 30.0–36.0)
MCV: 93.6 fL (ref 80.0–100.0)
Platelets: 421 10*3/uL — ABNORMAL HIGH (ref 150–400)
RBC: 4.05 MIL/uL (ref 3.87–5.11)
RDW: 15.4 % (ref 11.5–15.5)
WBC: 8.2 10*3/uL (ref 4.0–10.5)
nRBC: 0 % (ref 0.0–0.2)

## 2019-07-20 LAB — TROPONIN I (HIGH SENSITIVITY): Troponin I (High Sensitivity): <2 ng/L (ref <18)

## 2019-07-20 IMAGING — CR DG CHEST 2V
2 series · 2 of 2 positions shown · non-contrast
Comparison: [DATE]

CLINICAL DATA: Chest pain, shortness of breath

EXAM:
CHEST - 2 VIEW

[w chest pa]
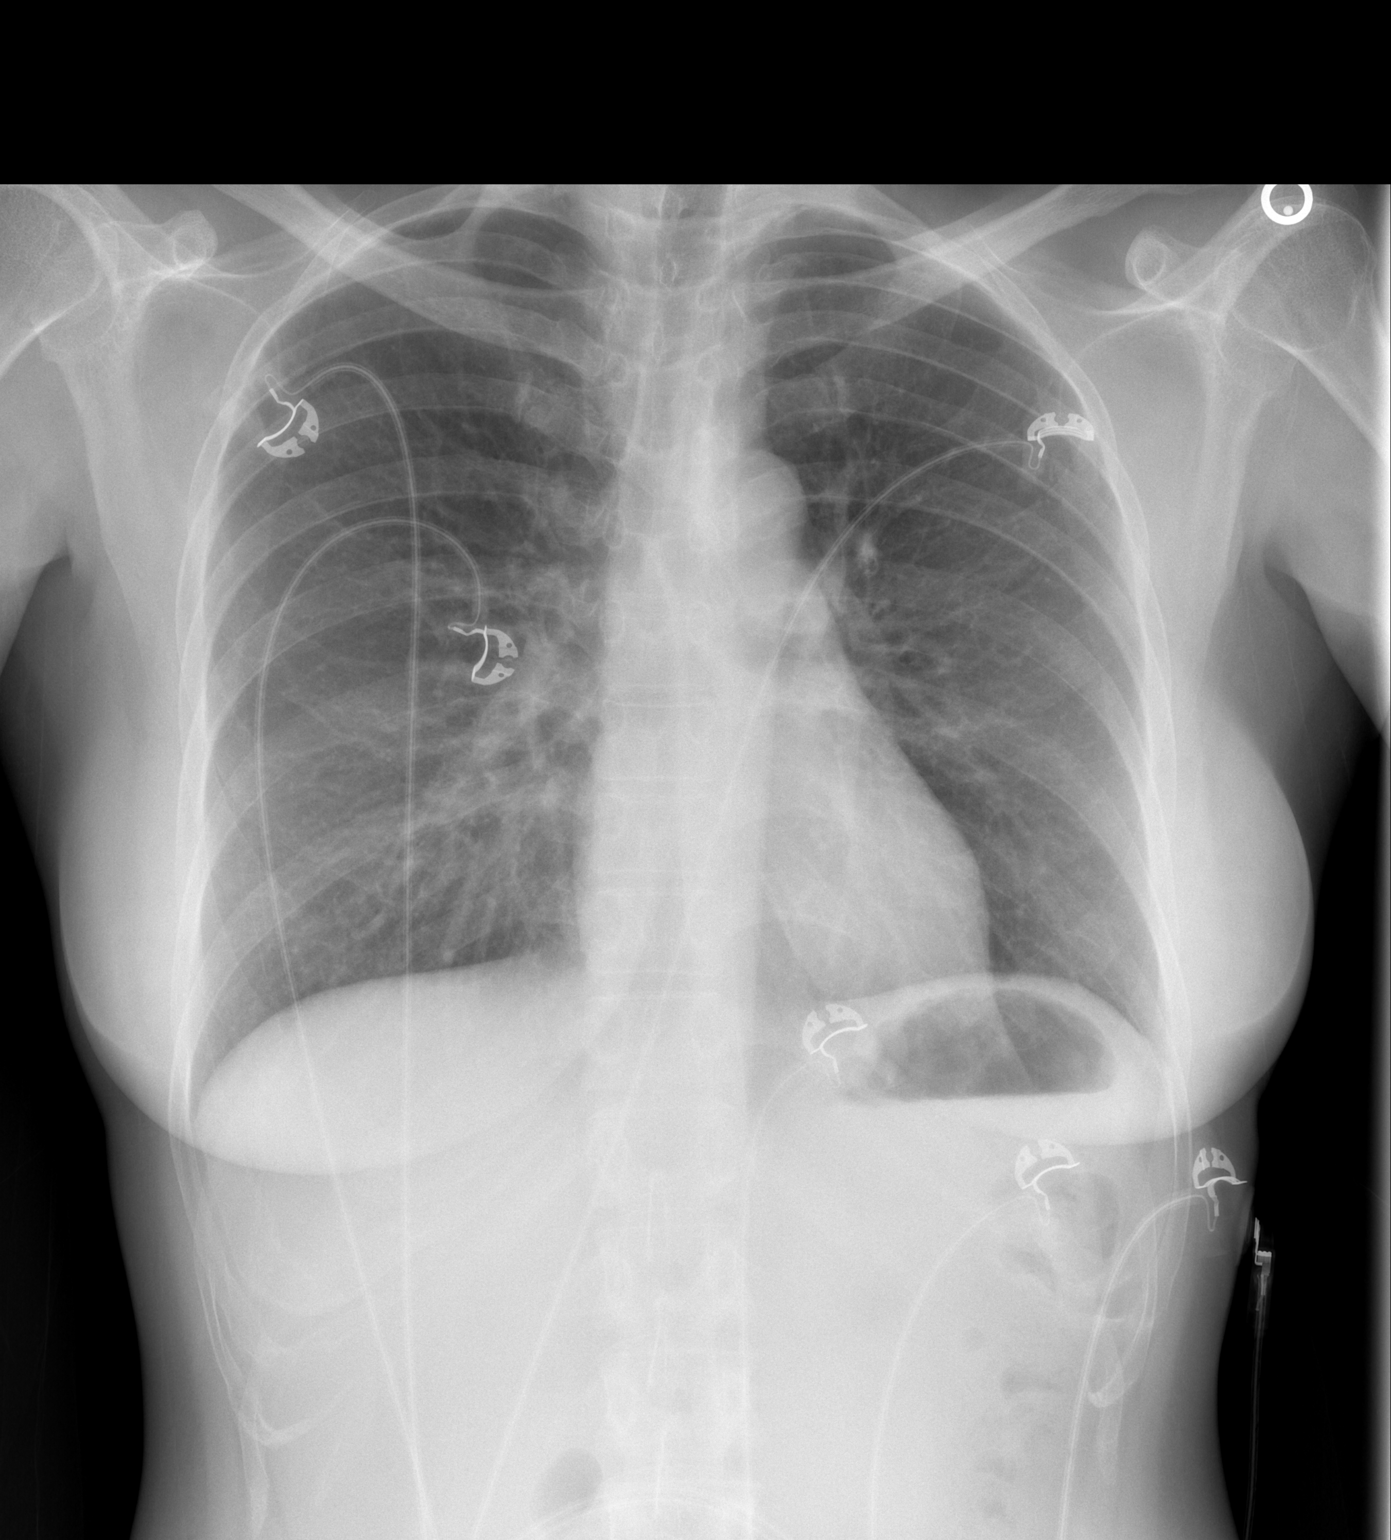

[w chest lat]
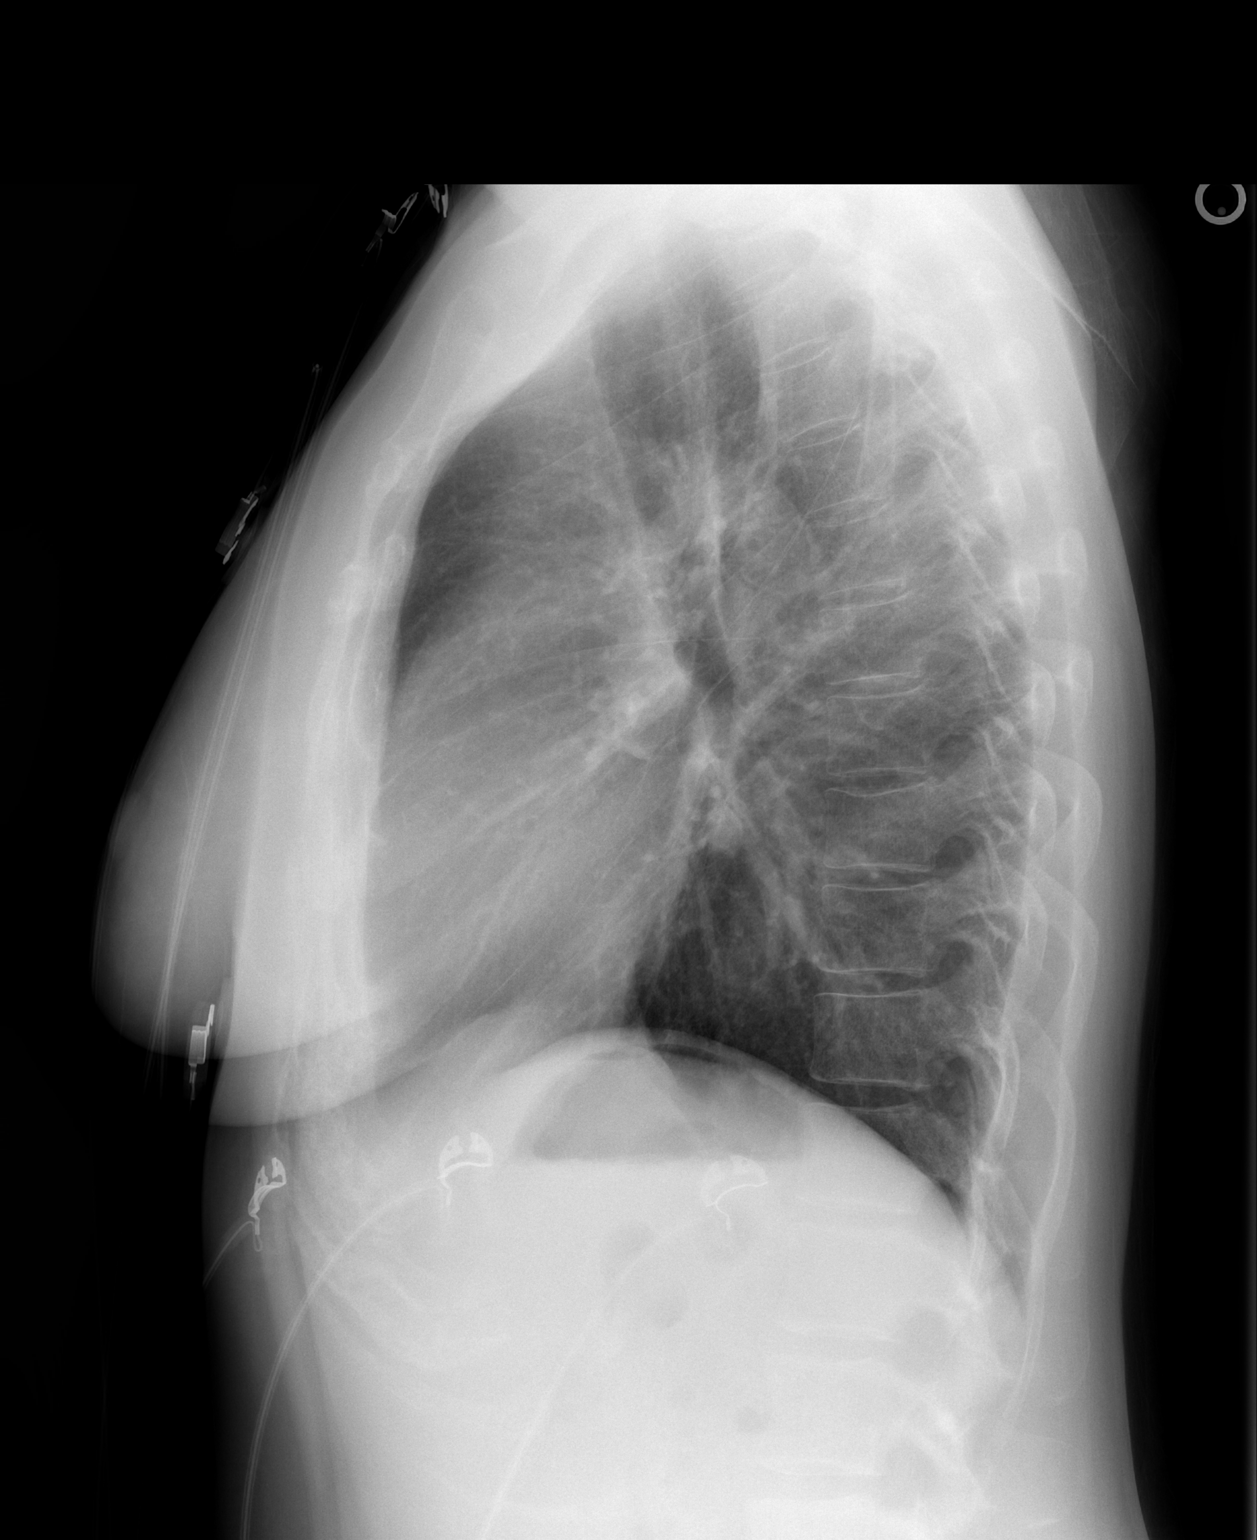

[2 of 2 positions shown; findings below may reference images not displayed]

FINDINGS: The heart size and mediastinal contours are within normal limits. No
focal airspace consolidation, pleural effusion, or pneumothorax. The
visualized skeletal structures are unremarkable.
IMPRESSION: No active cardiopulmonary disease.

## 2019-07-20 MED ORDER — SODIUM CHLORIDE 0.9 % IV BOLUS
1000.0000 mL | Freq: Once | INTRAVENOUS | Status: AC
  Administered 2019-07-20: 1000 mL via INTRAVENOUS

## 2019-07-20 MED ORDER — SODIUM CHLORIDE 0.9 % IV SOLN: INTRAVENOUS | Status: DC

## 2019-07-20 NOTE — ED Triage Notes (Signed)
SOB and Chest pain on exertion x2 days.

## 2019-07-20 NOTE — ED Notes (Signed)
Dr Rogene Houston made aware of loss of IV access.  RN unable to obtain with ultrasound.  PA to attempt u/s guided IV.  Repeat troponin cancelled per MD

## 2019-07-20 NOTE — ED Provider Notes (Signed)
Ultrasound ED Peripheral IV (Provider)  Date/Time: 07/20/2019 3:14 PM Performed by: Tedd Sias, PA Authorized by: Tedd Sias, PA   Procedure details:    Indications: hydration     Skin Prep: chlorhexidine gluconate     Location: Left upper arm.   Angiocath:  20 G   Bedside Ultrasound Guided: Yes     Images: not archived     Patient tolerated procedure without complications: Yes     Dressing applied: Yes   Comments:     20-gauge IV placed in left upper arm.  Patient tolerated procedure well.  Good blood draw and flush 20 cc of NS without difficulty.  No evidence of infiltration.      Pati Gallo [place], [place] 07/20/19 1516    Fredia Sorrow, MD 07/21/19 [phone]

## 2019-07-20 NOTE — ED Notes (Signed)
Second IV access infiltrated.  Dr Rogene Houston aware, cancelled IV fluids.  Pt tolerating po

## 2019-07-20 NOTE — ED Notes (Signed)
Pt ambulated to bathroom with steady gait.  Awaiting provider.

## 2019-07-20 NOTE — ED Provider Notes (Signed)
[facility] EMERGENCY DEPARTMENT Provider Note   CSN: [id number] Arrival date & time: 07/20/19  1032     History Chief Complaint  Patient presents with  . SOB and Chest pain    Zoe Clay is a 44 y.o. female.  Patient with a complaint of left upper substernal intermittent chest pain since Monday.  Only last 2 minutes at a time.  But does happen frequently.  Actually worse with lifting things with her arms.  The patient stated shortness of breath with it.  Initial complaint was shortness of breath chest pain with exertion.  The patient states that she can walk without any shortness of breath or any chest pain at all.  Think the exertion component relied to picking things up.  Patient has a history of bipolar disorder type 1 diabetes.  And also has a thyroid disorder.  Patient also states that she has been very fatigued.  She does work Health and safety inspector.  And has been very tired lately.  She thought it was from being overworked.  No fevers no nausea or vomiting.  No diarrhea.  No upper respiratory symptoms.         Past Medical History:  Diagnosis Date  . Bipolar 1 disorder ([place])   . Diabetes mellitus   . High cholesterol   . Hypertension   . Peripheral neuropathy   . Schizophrenia, acute ([place])   . Seizures ([place])    last seizure May 2016  . Thyroid disease     Patient Active Problem List   Diagnosis Date Noted  . DKA (diabetic ketoacidoses) ([place]) 06/20/2012  . Nausea with vomiting 06/20/2012  . 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 Seizure disorder ([place]) 06/20/2012  . Unspecified hypothyroidism 06/20/2012  . Hyponatremia 06/20/2012    Past Surgical History:  Procedure Laterality Date  . ANKLE SURGERY Bilateral 2009  . TUBAL LIGATION  1999     OB History    Gravida  4   Para  3   Term  3   Preterm  0   AB  1   Living  3     SAB  1   TAB  0   Ectopic  0   Multiple  0   Live Births  3           Family History  Problem Relation Age of Onset  . Diabetes Father   . Cancer Maternal Grandfather     Social History   Tobacco Use  . Smoking status: Current Every Day Smoker    Packs/day: 0.25    Types: Cigarettes  . Smokeless tobacco: Never Used  Substance Use Topics  . Alcohol use: No  . Drug use: No    Home Medications Prior to Admission medications   Medication Sig Start Date End Date Taking? Authorizing Provider  aspirin 81 MG EC tablet Take by mouth. 04/10/16  Yes [provider]  DULoxetine (CYMBALTA) 60 MG capsule Take 60 mg by mouth daily.    [provider]  gabapentin (NEURONTIN) 600 MG tablet Take 1 tablet (600 mg total) by mouth 2 (two) times daily. 01/10/15   Katheren Shams, DO  insulin aspart (NOVOLOG) 100 UNIT/ML injection Inject into the skin 3 (three) times daily before meals.    [provider]  insulin glargine (LANTUS) 100 UNIT/ML injection Inject 15 Units into the skin daily. 06/22/12   Regalado, Belkys A, MD  levothyroxine (SYNTHROID, LEVOTHROID) 125 MCG tablet  Take 125 mcg by mouth daily.      [provider]  lisinopril (PRINIVIL,ZESTRIL) 5 MG tablet Take 5 mg by mouth daily.    [provider]  phenytoin (DILANTIN) 100 MG ER capsule Take 200 mg by mouth 2 (two) times daily.     [provider]  simvastatin (ZOCOR) 20 MG tablet Take 20 mg by mouth every evening.    [provider]    Allergies    Contrast media [iodinated diagnostic agents] and Iohexol  Review of Systems   Review of Systems  Constitutional: Negative for chills and fever.  HENT: Negative for congestion, rhinorrhea and sore throat.   Eyes: Negative for visual disturbance.  Respiratory: Positive for shortness of breath. Negative for cough.   Cardiovascular: Positive for chest pain. Negative for leg swelling.  Gastrointestinal: Negative for abdominal pain, diarrhea, nausea and vomiting.  Genitourinary: Negative for  dysuria.  Musculoskeletal: Negative for back pain and neck pain.  Skin: Negative for rash.  Neurological: Negative for dizziness, light-headedness and headaches.  Hematological: Does not bruise/bleed easily.  Psychiatric/Behavioral: Negative for confusion.    Physical Exam Updated Vital Signs BP 104/80 (BP Location: Right Arm)   Pulse 93   Temp 98.6 F (37 C) (Oral)   Resp 19   Ht 1.651 m (5\' 5" )   Wt 74.3 kg   LMP 02/19/2019   SpO2 97%   BMI 27.26 kg/m   Physical Exam Vitals and nursing note reviewed.  Constitutional:      General: She is not in acute distress.    Appearance: Normal appearance. She is well-developed.  HENT:     Head: Normocephalic and atraumatic.  Eyes:     Extraocular Movements: Extraocular movements intact.     Conjunctiva/sclera: Conjunctivae normal.     Pupils: Pupils are equal, round, and reactive to light.  Cardiovascular:     Rate and Rhythm: Normal rate and regular rhythm.     Heart sounds: No murmur.  Pulmonary:     Effort: Pulmonary effort is normal. No respiratory distress.     Breath sounds: Normal breath sounds.  Chest:     Chest wall: No tenderness.  Abdominal:     Palpations: Abdomen is soft.     Tenderness: There is no abdominal tenderness.  Musculoskeletal:        General: No swelling. Normal range of motion.     Cervical back: Normal range of motion and neck supple.     Right lower leg: Edema present.     Left lower leg: Edema present.  Skin:    General: Skin is warm and dry.     Capillary Refill: Capillary refill takes less than 2 seconds.  Neurological:     General: No focal deficit present.     Mental Status: She is alert and oriented to person, place, and time.     ED Results / Procedures / Treatments   Labs (all labs ordered are listed, but only abnormal results are displayed) Labs Reviewed  BASIC METABOLIC PANEL - Abnormal; Notable for the following components:      Result Value   CO2 19 (*)    Glucose, Bld 411  (*)    All other components within normal limits  CBC - Abnormal; Notable for the following components:   Platelets 421 (*)    All other components within normal limits  TROPONIN I (HIGH SENSITIVITY)    EKG EKG Interpretation  Date/Time:  Wednesday July 20 2019 10:50:47 EDT  Ventricular Rate:  107 PR Interval:    QRS Duration: 81 QT Interval:  320 QTC Calculation: 427 R Axis:   75 Text Interpretation: Sinus tachycardia Nonspecific T abnormalities, inferior leads Baseline wander in lead(s) V6 Confirmed by Fredia Sorrow [phone]) on 07/20/2019 11:05:34 AM   Radiology DG Chest 2 View  Result Date: 07/20/2019 CLINICAL DATA:  Chest pain, shortness of breath EXAM: CHEST - 2 VIEW COMPARISON:  12/15/2018 FINDINGS: The heart size and mediastinal contours are within normal limits. No focal airspace consolidation, pleural effusion, or pneumothorax. The visualized skeletal structures are unremarkable. IMPRESSION: No active cardiopulmonary disease. Electronically Signed   By: Davina Poke D.O.   On: 07/20/2019 11:27    Procedures Procedures (including critical care time)  Medications Ordered in ED Medications  0.9 %  sodium chloride infusion ( Intravenous Not Given 07/20/19 1421)  sodium chloride 0.9 % bolus 1,000 mL (0 mLs Intravenous Stopped 07/20/19 1411)    ED Course  I have reviewed the triage vital signs and the nursing notes.  Pertinent labs & imaging results that were available during my care of the patient were reviewed by me and considered in my medical decision making (see chart for details).    MDM Rules/Calculators/A&P                      Patient's work-up here for the chest pain troponin normal.  Chest x-ray negative.  Oxygen saturations 100% on room air.  Again she had no exertional shortness of breath.  A little tachycardic.  Feel that has to do with her markedly elevated blood sugar.  Patient to receive IV fluid hydration here.  She does have a sliding scale insulin at  home as well as her normal insulin that she takes at bedtime.  She does have primary care doctor to follow-up.  Recommended she have her thyroid rechecked.  Organ to take her out of work for 2 days letter rest and see how things improve.  No evidence at this time or concerns for pulmonary embolus or for acute cardiac event.  Also on her labs although blood sugar was in the 400 range no evidence of diabetic ketoacidosis.  Her anion gap was normal.  Patient is comfortable with discharge home.     Final Clinical Impression(s) / ED Diagnoses Final diagnoses:  Fatigue, unspecified type  Precordial pain  Hyperglycemia    Rx / DC Orders ED Discharge Orders    None       Fredia Sorrow, MD 07/20/19 1459

## 2019-07-20 NOTE — ED Notes (Signed)
Pt discharged to home. Discharge instructions have been discussed with patient and/or family members. Pt verbally acknowledges understanding d/c instructions, and endorses comprehension to checkout at registration before leaving.  °

## 2019-07-20 NOTE — Discharge Instructions (Addendum)
Follow-up with your primary care doctor to have your thyroid rechecked.  Blood sugar is high today but no evidence of diabetic ketoacidosis.  Follow your sugars carefully with your sliding scale and standard daily insulin.  Return for any new or worse symptoms.  Work-up for the chest pain without any acute findings.  Suggestive of may be chest wall pain.

## 2019-07-20 NOTE — ED Notes (Signed)
Pt on monitor 

## 2019-08-08 ENCOUNTER — Encounter (HOSPITAL_BASED_OUTPATIENT_CLINIC_OR_DEPARTMENT_OTHER): Payer: Self-pay

## 2019-08-08 ENCOUNTER — Other Ambulatory Visit: Payer: Self-pay

## 2019-08-08 ENCOUNTER — Emergency Department (HOSPITAL_BASED_OUTPATIENT_CLINIC_OR_DEPARTMENT_OTHER)
Admission: EM | Admit: 2019-08-08 | Discharge: 2019-08-09 | Disposition: A | Discharge status: Home / Self Care | Payer: Self-pay | Attending: Emergency Medicine | Admitting: Emergency Medicine

## 2019-08-08 DIAGNOSIS — Z79899 Other long term (current) drug therapy: Secondary | ICD-10-CM | POA: Insufficient documentation

## 2019-08-08 DIAGNOSIS — E162 Hypoglycemia, unspecified: Secondary | ICD-10-CM

## 2019-08-08 DIAGNOSIS — E10649 Type 1 diabetes mellitus with hypoglycemia without coma: Secondary | ICD-10-CM | POA: Insufficient documentation

## 2019-08-08 DIAGNOSIS — Z7982 Long term (current) use of aspirin: Secondary | ICD-10-CM | POA: Insufficient documentation

## 2019-08-08 DIAGNOSIS — F17210 Nicotine dependence, cigarettes, uncomplicated: Secondary | ICD-10-CM | POA: Insufficient documentation

## 2019-08-08 DIAGNOSIS — Z794 Long term (current) use of insulin: Secondary | ICD-10-CM | POA: Insufficient documentation

## 2019-08-08 LAB — CBG MONITORING, ED: Glucose-Capillary: 85 mg/dL (ref 70–99)

## 2019-08-08 NOTE — ED Provider Notes (Signed)
[facility] EMERGENCY DEPARTMENT Provider Note   CSN: [id number] Arrival date & time: 08/08/19  2222     History Chief Complaint  Patient presents with  . Hypoglycemia    Zoe Clay is a 44 y.o. female.  Patient presents to the emergency department for evaluation of low blood sugar.  Patient reports that she checked her sugar this evening and it was 201.  She used her sliding scale insulin and then ate a sandwich.  She took a nap and when she woke up she was very shaky and did not feel well.  Her blood sugar was checked and it was 40.  She was given orange juice and some oral sugar and her symptoms improved and her blood sugar came up.  She reports that she has not recently been ill.  No chest pain, cough, shortness of breath, fever or other symptoms.  Patient normally drinks [facility] with her meals but only had water tonight with a sandwich, thinks she did not get enough into her to maintain her sugar.        Past Medical History:  Diagnosis Date  . Bipolar 1 disorder ([place])   . Diabetes mellitus   . High cholesterol   . Hypertension   . Peripheral neuropathy   . Schizophrenia, acute ([place])   . Seizures ([place])    last seizure May 2016  . Thyroid disease     Patient Active Problem List   Diagnosis Date Noted  . DKA (diabetic ketoacidoses) ([place]) 06/20/2012  . Nausea with vomiting 06/20/2012  . 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 Seizure disorder ([place]) 06/20/2012  . Unspecified hypothyroidism 06/20/2012  . Hyponatremia 06/20/2012    Past Surgical History:  Procedure Laterality Date  . ANKLE SURGERY Bilateral 2009  . TUBAL LIGATION  1999     OB History    Gravida  4   Para  3   Term  3   Preterm  0   AB  1   Living  3     SAB  1   TAB  0   Ectopic  0   Multiple  0   Live Births  3           Family History  Problem Relation Age of Onset  . Diabetes Father   . Cancer  Maternal Grandfather     Social History   Tobacco Use  . Smoking status: Current Every Day Smoker    Packs/day: 0.25    Types: Cigarettes  . Smokeless tobacco: Never Used  Substance Use Topics  . Alcohol use: No  . Drug use: No    Home Medications Prior to Admission medications   Medication Sig Start Date End Date Taking? Authorizing Provider  aspirin 81 MG EC tablet Take by mouth. 04/10/16   [provider]  DULoxetine (CYMBALTA) 60 MG capsule Take 60 mg by mouth daily.    [provider]  gabapentin (NEURONTIN) 600 MG tablet Take 1 tablet (600 mg total) by mouth 2 (two) times daily. 01/10/15   Katheren Shams, DO  insulin aspart (NOVOLOG) 100 UNIT/ML injection Inject into the skin 3 (three) times daily before meals.    [provider]  insulin glargine (LANTUS) 100 UNIT/ML injection Inject 15 Units into the skin daily. 06/22/12   Regalado, Belkys A, MD  levothyroxine (SYNTHROID, LEVOTHROID) 125 MCG tablet Take 125 mcg by mouth daily.      [provider]  lisinopril (PRINIVIL,ZESTRIL) 5 MG tablet Take 5 mg by mouth daily.    [provider]  phenytoin (DILANTIN) 100 MG ER capsule Take 200 mg by mouth 2 (two) times daily.     [provider]  simvastatin (ZOCOR) 20 MG tablet Take 20 mg by mouth every evening.    [provider]    Allergies    Contrast media [iodinated diagnostic agents] and Iohexol  Review of Systems   Review of Systems  Constitutional: Negative for chills and fever.  HENT: Negative for ear pain and sore throat.   Eyes: Negative for pain and visual disturbance.  Respiratory: Negative.  Negative for cough and shortness of breath.   Cardiovascular: Negative.  Negative for chest pain and palpitations.  Gastrointestinal: Negative.  Negative for abdominal pain and vomiting.  Genitourinary: Negative for dysuria and hematuria.  Musculoskeletal: Negative for arthralgias and back pain.  Skin: Negative for  color change and rash.  Neurological: Negative for seizures and syncope.  All other systems reviewed and are negative.   Physical Exam Updated Vital Signs BP 113/80 (BP Location: Left Arm)   Pulse (!) 101   Temp 98 F (36.7 C) (Oral)   Resp 14   Ht 5\' 5"  (1.651 m)   Wt 75.3 kg   SpO2 100%   BMI 27.64 kg/m   Physical Exam Vitals and nursing note reviewed.  Constitutional:      General: She is not in acute distress.    Appearance: Normal appearance. She is well-developed.  HENT:     Head: Normocephalic and atraumatic.     Right Ear: Hearing normal.     Left Ear: Hearing normal.     Nose: Nose normal.  Eyes:     Conjunctiva/sclera: Conjunctivae normal.     Pupils: Pupils are equal, round, and reactive to light.  Cardiovascular:     Rate and Rhythm: Regular rhythm.     Heart sounds: S1 normal and S2 normal. No murmur. No friction rub. No gallop.   Pulmonary:     Effort: Pulmonary effort is normal. No respiratory distress.     Breath sounds: Normal breath sounds.  Chest:     Chest wall: No tenderness.  Abdominal:     General: Bowel sounds are normal.     Palpations: Abdomen is soft.     Tenderness: There is no abdominal tenderness. There is no guarding or rebound. Negative signs include Murphy's sign and McBurney's sign.     Hernia: No hernia is present.  Musculoskeletal:        General: Normal range of motion.     Cervical back: Normal range of motion and neck supple.  Skin:    General: Skin is warm and dry.     Findings: No rash.  Neurological:     Mental Status: She is alert and oriented to person, place, and time.     GCS: GCS eye subscore is 4. GCS verbal subscore is 5. GCS motor subscore is 6.     Cranial Nerves: No cranial nerve deficit.     Sensory: No sensory deficit.     Coordination: Coordination normal.  Psychiatric:        Speech: Speech normal.        Behavior: Behavior normal.        Thought Content: Thought content normal.     ED Results /  Procedures / Treatments   Labs (all labs ordered are listed, but only abnormal results are  displayed) Labs Reviewed  CBG MONITORING, ED - Abnormal; Notable for the following components:      Result Value   Glucose-Capillary 174 (*)    All other components within normal limits  CBG MONITORING, ED    EKG None  Radiology No results found.  Procedures Procedures (including critical care time)  Medications Ordered in ED Medications - No data to display  ED Course  I have reviewed the triage vital signs and the nursing notes.  Pertinent labs & imaging results that were available during my care of the patient were reviewed by me and considered in my medical decision making (see chart for details).    MDM Rules/Calculators/A&P                      Patient presents to the emergency department for evaluation of low blood sugar.  Patient is a diabetic, takes insulin.  She is not ill.  She has not had any symptoms prior to waking up with hypoglycemia symptoms after a nap today.  She has been fed here in the ER and monitored.  She has done well, no complaints.  Blood sugar resolving, appropriate for discharge.  Final Clinical Impression(s) / ED Diagnoses Final diagnoses:  Hypoglycemia    Rx / DC Orders ED Discharge Orders    None       Raynie Steinhaus, Gwenyth Allegra, MD 08/09/19 0025

## 2019-08-08 NOTE — ED Notes (Signed)
Patient provided with food per provider order.

## 2019-08-08 NOTE — ED Triage Notes (Signed)
Pt reports "I woke up and my blood sugar was low and I had a seizure" ~30-40 min PTA-states BS 40-her mother gave her OJ and sugar in a tube-NAD-steady gait

## 2019-08-09 LAB — CBG MONITORING, ED: Glucose-Capillary: 174 mg/dL — ABNORMAL HIGH (ref 70–99)

## 2019-11-04 ENCOUNTER — Other Ambulatory Visit: Payer: Self-pay

## 2019-11-04 ENCOUNTER — Emergency Department (HOSPITAL_BASED_OUTPATIENT_CLINIC_OR_DEPARTMENT_OTHER): Payer: Self-pay

## 2019-11-04 ENCOUNTER — Encounter (HOSPITAL_BASED_OUTPATIENT_CLINIC_OR_DEPARTMENT_OTHER): Payer: Self-pay | Admitting: Emergency Medicine

## 2019-11-04 ENCOUNTER — Emergency Department (HOSPITAL_BASED_OUTPATIENT_CLINIC_OR_DEPARTMENT_OTHER)
Admission: EM | Admit: 2019-11-04 | Discharge: 2019-11-05 | Disposition: A | Discharge status: Home / Self Care | Payer: Self-pay | Attending: Emergency Medicine | Admitting: Emergency Medicine

## 2019-11-04 DIAGNOSIS — E1165 Type 2 diabetes mellitus with hyperglycemia: Secondary | ICD-10-CM | POA: Insufficient documentation

## 2019-11-04 DIAGNOSIS — Z794 Long term (current) use of insulin: Secondary | ICD-10-CM | POA: Insufficient documentation

## 2019-11-04 DIAGNOSIS — Z79899 Other long term (current) drug therapy: Secondary | ICD-10-CM | POA: Insufficient documentation

## 2019-11-04 DIAGNOSIS — R0602 Shortness of breath: Secondary | ICD-10-CM | POA: Insufficient documentation

## 2019-11-04 DIAGNOSIS — E039 Hypothyroidism, unspecified: Secondary | ICD-10-CM | POA: Insufficient documentation

## 2019-11-04 DIAGNOSIS — F17210 Nicotine dependence, cigarettes, uncomplicated: Secondary | ICD-10-CM | POA: Insufficient documentation

## 2019-11-04 DIAGNOSIS — I10 Essential (primary) hypertension: Secondary | ICD-10-CM | POA: Insufficient documentation

## 2019-11-04 DIAGNOSIS — R739 Hyperglycemia, unspecified: Secondary | ICD-10-CM

## 2019-11-04 DIAGNOSIS — R0789 Other chest pain: Secondary | ICD-10-CM | POA: Insufficient documentation

## 2019-11-04 DIAGNOSIS — Z7982 Long term (current) use of aspirin: Secondary | ICD-10-CM | POA: Insufficient documentation

## 2019-11-04 LAB — BASIC METABOLIC PANEL
Anion gap: 10 (ref 5–15)
BUN: 14 mg/dL (ref 6–20)
CO2: 26 mmol/L (ref 22–32)
Calcium: 9.1 mg/dL (ref 8.9–10.3)
Chloride: 99 mmol/L (ref 98–111)
Creatinine, Ser: 0.71 mg/dL (ref 0.44–1.00)
GFR calc Af Amer: >60 mL/min (ref >60)
GFR calc non Af Amer: >60 mL/min (ref >60)
Glucose, Bld: 403 mg/dL — ABNORMAL HIGH (ref 70–99)
Potassium: 3.4 mmol/L — ABNORMAL LOW (ref 3.5–5.1)
Sodium: 135 mmol/L (ref 135–145)

## 2019-11-04 LAB — TROPONIN I (HIGH SENSITIVITY): Troponin I (High Sensitivity): <2 ng/L (ref <18)

## 2019-11-04 LAB — CBC
HCT: 36.7 % (ref 36.0–46.0)
Hemoglobin: 12.2 g/dL (ref 12.0–15.0)
MCH: 30.2 pg (ref 26.0–34.0)
MCHC: 33.2 g/dL (ref 30.0–36.0)
MCV: 90.8 fL (ref 80.0–100.0)
Platelets: 381 10*3/uL (ref 150–400)
RBC: 4.04 MIL/uL (ref 3.87–5.11)
RDW: 14.1 % (ref 11.5–15.5)
WBC: 8 10*3/uL (ref 4.0–10.5)
nRBC: 0 % (ref 0.0–0.2)

## 2019-11-04 LAB — CBG MONITORING, ED: Glucose-Capillary: 293 mg/dL — ABNORMAL HIGH (ref 70–99)

## 2019-11-04 IMAGING — DX DG CHEST 2V
2 series · 2 of 2 positions shown · non-contrast
Comparison: [DATE]

CLINICAL DATA: Left chest pain

EXAM:
CHEST - 2 VIEW

[chest pa]
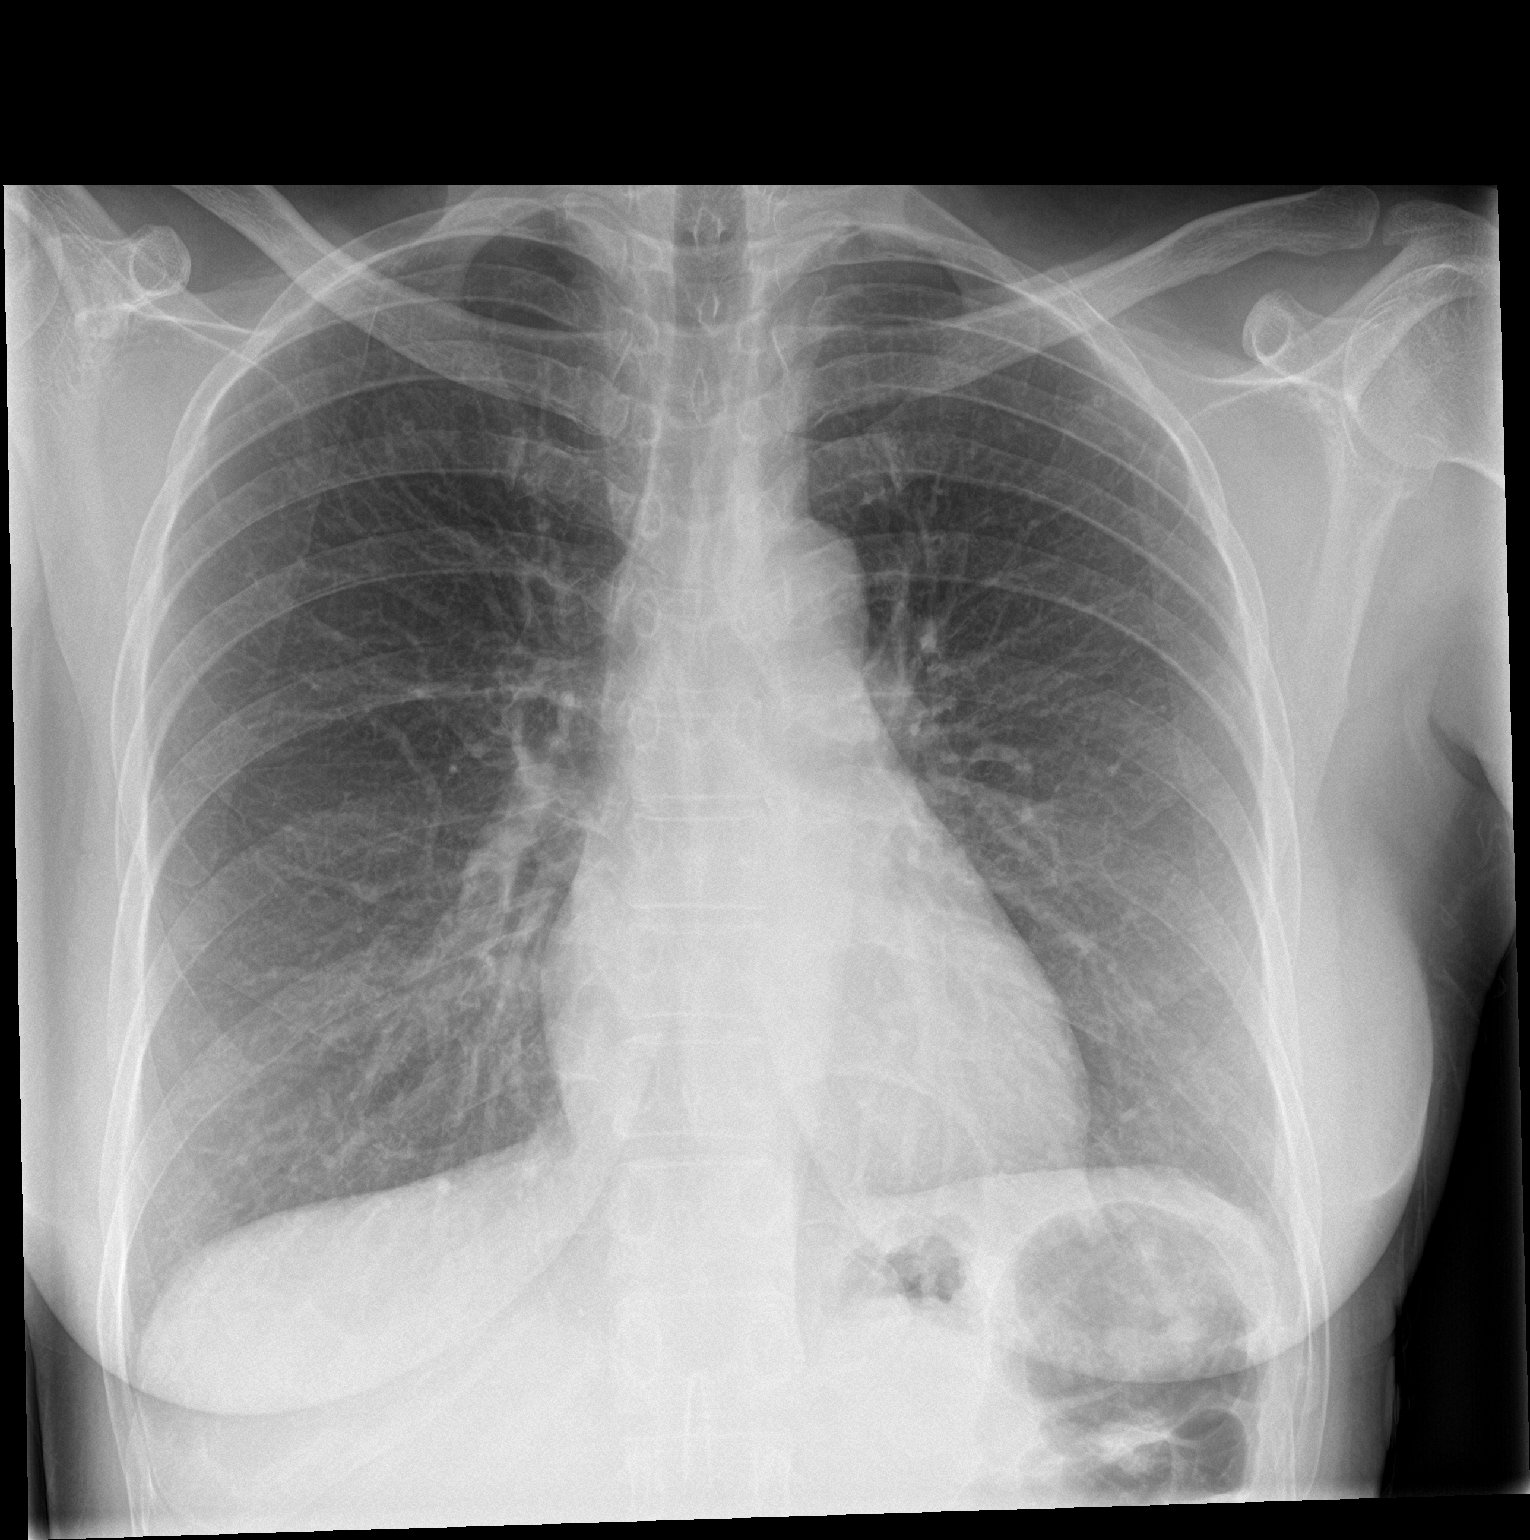

[chest lat]
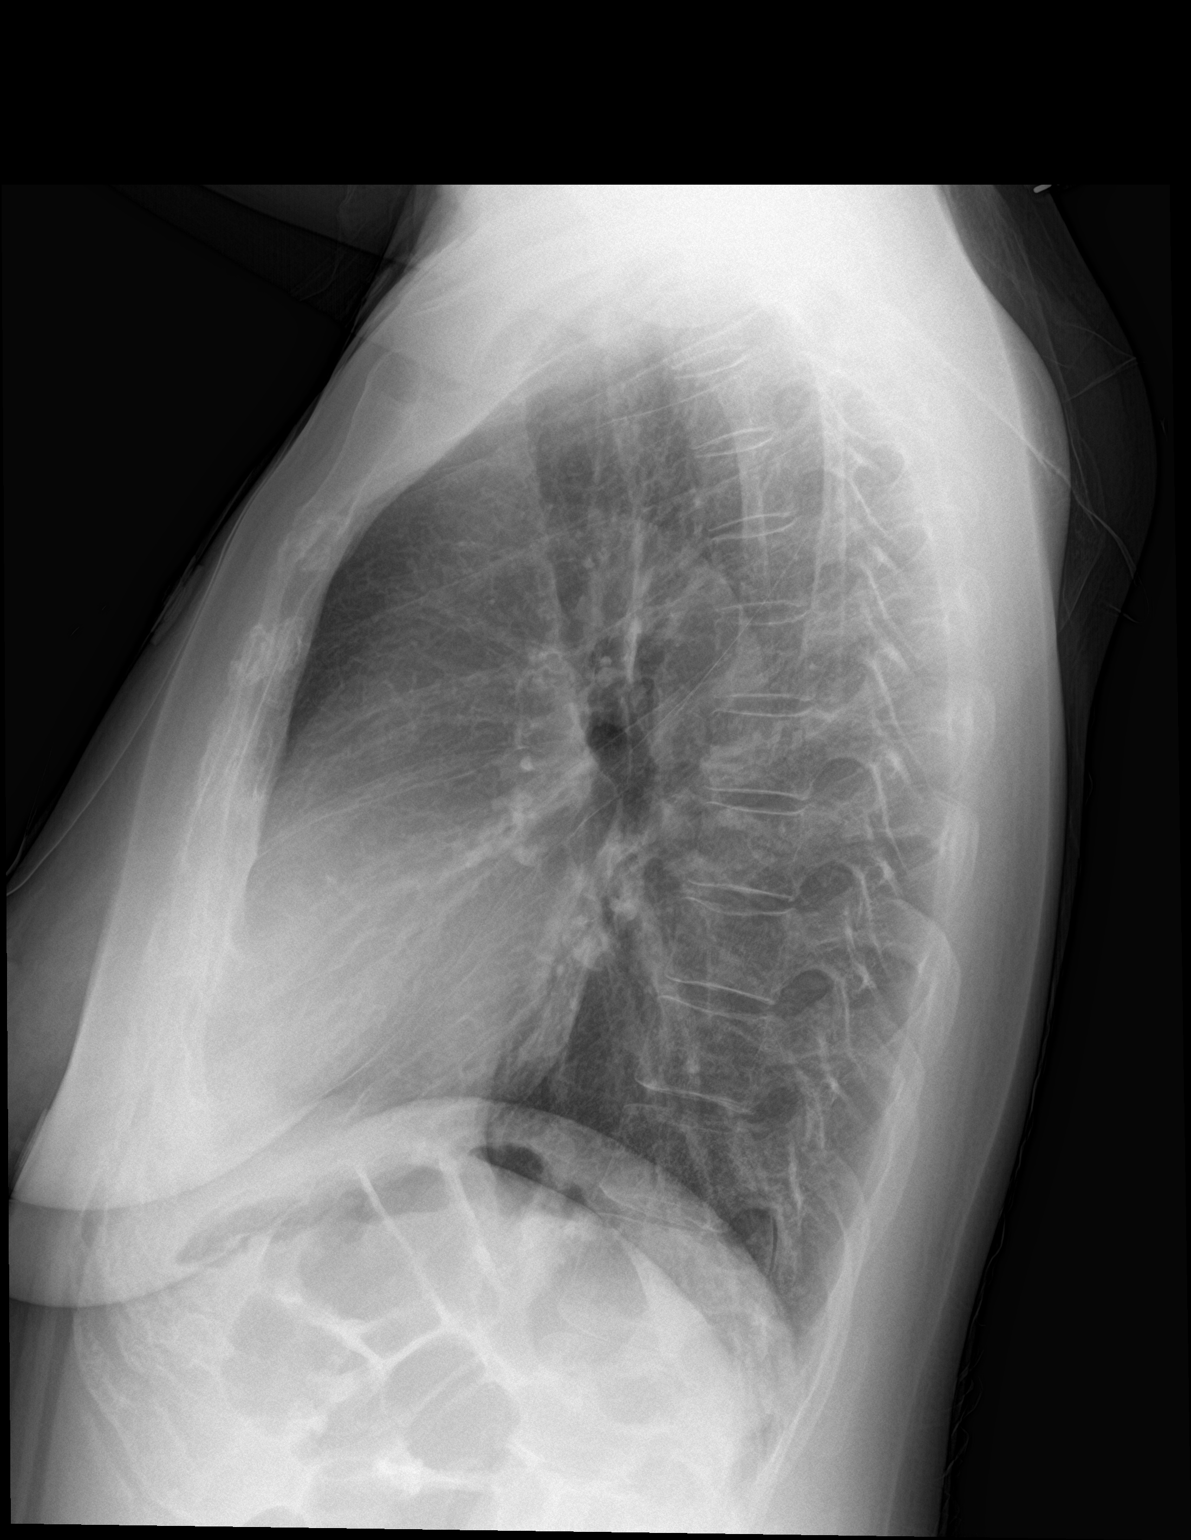

[2 of 2 positions shown; findings below may reference images not displayed]

FINDINGS: The heart size and mediastinal contours are within normal limits.
Both lungs are clear. The visualized skeletal structures are
unremarkable.
IMPRESSION: No active cardiopulmonary disease.

## 2019-11-04 MED ORDER — SODIUM CHLORIDE 0.9 % IV BOLUS
1000.0000 mL | Freq: Once | INTRAVENOUS | Status: AC
  Administered 2019-11-04: 1000 mL via INTRAVENOUS

## 2019-11-04 MED ORDER — ASPIRIN 81 MG PO CHEW
243.0000 mg | CHEWABLE_TABLET | Freq: Once | ORAL | Status: AC
  Administered 2019-11-04: 243 mg via ORAL
  Filled 2019-11-04: qty 3

## 2019-11-04 MED ORDER — SODIUM CHLORIDE 0.9% FLUSH
3.0000 mL | Freq: Once | INTRAVENOUS | Status: DC
  Filled 2019-11-04: qty 3

## 2019-11-04 NOTE — ED Triage Notes (Signed)
Pt c/o left side CP x 2 days. Pt reports pain resolves then returns. Pt also endorses sob and diaphoresis. Pt with steady gait to triage. Pt also reports LUQ pain.

## 2019-11-04 NOTE — ED Notes (Signed)
Patient transported to X-ray 

## 2019-11-04 NOTE — ED Provider Notes (Signed)
[facility] EMERGENCY DEPARTMENT Provider Note   CSN: [id number] Arrival date & time: 11/04/19  1949     History Chief Complaint  Patient presents with  . Chest Pain    Zoe Clay is a 44 y.o. female.  44 year old female presents with complaint of chest pain.  Patient states pain is located on the left side of her chest, intermittent, lasts for 2 to 3 minutes and then goes away for 45 minutes.  Patient has been ongoing since yesterday, associated with shortness of breath at times.  Denies nausea, vomiting, diaphoresis.  Pain is not worse with exertion.  Pain is worse with palpation of her ribs and with lying supine.  No history of cardiac problems.  Patient is a daily smoker, has a history of hypertension, hyperlipidemia, diabetes.  No significant family history.  No other complaints or concerns.     HPI: A 44 year old patient with a history of treated diabetes, hypertension and hypercholesterolemia presents for evaluation of chest pain. Initial onset of pain was approximately 3-6 hours ago. The patient's chest pain is sharp and is not worse with exertion. The patient's chest pain is middle- or left-sided, is not well-localized, is not described as heaviness/pressure/tightness and does not radiate to the arms/jaw/neck. The patient does not complain of nausea and denies diaphoresis. The patient has smoked in the past 90 days. The patient has no history of stroke, has no history of peripheral artery disease, has no relevant family history of coronary artery disease (first degree relative at less than age 32) and does not have an elevated BMI (>=30).   Past Medical History:  Diagnosis Date  . Bipolar 1 disorder ([place])   . Diabetes mellitus   . High cholesterol   . Hypertension   . Peripheral neuropathy   . Schizophrenia, acute ([place])   . Seizures ([place])    last seizure May 2016  . Thyroid disease     Patient Active Problem List   Diagnosis Date Noted  . DKA (diabetic  ketoacidoses) ([place]) 06/20/2012  . Nausea with vomiting 06/20/2012  . 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 Seizure disorder ([place]) 06/20/2012  . Unspecified hypothyroidism 06/20/2012  . Hyponatremia 06/20/2012    Past Surgical History:  Procedure Laterality Date  . ANKLE SURGERY Bilateral 2009  . TUBAL LIGATION  1999     OB History    Gravida  4   Para  3   Term  3   Preterm  0   AB  1   Living  3     SAB  1   TAB  0   Ectopic  0   Multiple  0   Live Births  3           Family History  Problem Relation Age of Onset  . Diabetes Father   . Cancer Maternal Grandfather     Social History   Tobacco Use  . Smoking status: Current Every Day Smoker    Packs/day: 0.25    Types: Cigarettes  . Smokeless tobacco: Never Used  Vaping Use  . Vaping Use: Never used  Substance Use Topics  . Alcohol use: No  . Drug use: No    Home Medications Prior to Admission medications   Medication Sig Start Date End Date Taking? Authorizing Provider  aspirin 81 MG EC tablet Take by mouth. 04/10/16   [provider]  DULoxetine (CYMBALTA) 60 MG capsule Take 60 mg by  mouth daily.    [provider]  gabapentin (NEURONTIN) 600 MG tablet Take 1 tablet (600 mg total) by mouth 2 (two) times daily. 01/10/15   Katheren Shams, DO  insulin aspart (NOVOLOG) 100 UNIT/ML injection Inject into the skin 3 (three) times daily before meals.    [provider]  insulin glargine (LANTUS) 100 UNIT/ML injection Inject 15 Units into the skin daily. 06/22/12   Regalado, Belkys A, MD  levothyroxine (SYNTHROID, LEVOTHROID) 125 MCG tablet Take 125 mcg by mouth daily.      [provider]  lisinopril (PRINIVIL,ZESTRIL) 5 MG tablet Take 5 mg by mouth daily.    [provider]  phenytoin (DILANTIN) 100 MG ER capsule Take 200 mg by mouth 2 (two) times daily.     [provider]  simvastatin (ZOCOR) 20  MG tablet Take 20 mg by mouth every evening.    [provider]    Allergies    Contrast media [iodinated diagnostic agents] and Iohexol  Review of Systems   Review of Systems  Constitutional: Negative for chills, diaphoresis and fever.  Respiratory: Positive for shortness of breath.   Cardiovascular: Positive for chest pain. Negative for palpitations and leg swelling.  Gastrointestinal: Negative for abdominal pain, nausea and vomiting.  Musculoskeletal: Negative for arthralgias and myalgias.  Skin: Negative for rash and wound.  Allergic/Immunologic: Positive for immunocompromised state.  Neurological: Negative for weakness.  All other systems reviewed and are negative.   Physical Exam Updated Vital Signs BP 120/84 (BP Location: Right Arm)   Pulse 86   Temp 98.2 F (36.8 C) (Oral)   Resp 14   Ht 5\' 5"  (1.651 m)   Wt 68 kg   LMP  (Approximate) Comment: 3 years since LMP  SpO2 100%   BMI 24.96 kg/m   Physical Exam Vitals and nursing note reviewed.  Constitutional:      General: She is not in acute distress.    Appearance: She is well-developed. She is not diaphoretic.  HENT:     Head: Normocephalic and atraumatic.  Cardiovascular:     Rate and Rhythm: Normal rate and regular rhythm.     Heart sounds: Normal heart sounds. No murmur heard.   Pulmonary:     Effort: Pulmonary effort is normal.     Breath sounds: Normal breath sounds.  Chest:     Chest wall: Tenderness present. No mass, deformity or crepitus.    Abdominal:     Palpations: Abdomen is soft.     Tenderness: There is no abdominal tenderness.  Musculoskeletal:     Right lower leg: No edema.     Left lower leg: No edema.  Skin:    General: Skin is warm and dry.     Findings: No erythema or rash.  Neurological:     Mental Status: She is alert and oriented to person, place, and time.  Psychiatric:        Behavior: Behavior normal.     ED Results / Procedures / Treatments   Labs (all labs  ordered are listed, but only abnormal results are displayed) Labs Reviewed  BASIC METABOLIC PANEL - Abnormal; Notable for the following components:      Result Value   Potassium 3.4 (*)    Glucose, Bld 403 (*)    All other components within normal limits  CBC  TROPONIN I (HIGH SENSITIVITY)    EKG EKG Interpretation  Date/Time:  Friday November 04 2019 19:56:34 EDT Ventricular Rate:  102 PR Interval:  136 QRS Duration: 84 QT Interval:  338 QTC Calculation: 440 R Axis:   42 Text Interpretation: Sinus tachycardia Otherwise normal ECG Confirmed by Lennice Sites [phone]) on 11/04/2019 8:00:11 PM Also confirmed by Lennice Sites [phone])  on 11/04/2019 10:35:34 PM   Radiology DG Chest 2 View  Result Date: 11/04/2019 CLINICAL DATA:  Left chest pain EXAM: CHEST - 2 VIEW COMPARISON:  09/18/2019 FINDINGS: The heart size and mediastinal contours are within normal limits. Both lungs are clear. The visualized skeletal structures are unremarkable. IMPRESSION: No active cardiopulmonary disease. Electronically Signed   By: Fidela Salisbury MD   On: 11/04/2019 20:22    Procedures Procedures (including critical care time)  Medications Ordered in ED Medications  sodium chloride flush (NS) 0.9 % injection 3 mL (3 mLs Intravenous Not Given 11/04/19 2111)  sodium chloride 0.9 % bolus 1,000 mL (has no administration in time range)  aspirin chewable tablet 243 mg (243 mg Oral Given 11/04/19 2217)  sodium chloride 0.9 % bolus 1,000 mL (1,000 mLs Intravenous New Bag/Given 11/04/19 2313)    ED Course  I have reviewed the triage vital signs and the nursing notes.  Pertinent labs & imaging results that were available during my care of the patient were reviewed by me and considered in my medical decision making (see chart for details).  Clinical Course as of Nov 03 2312  Fri Nov 03, 6325  2074 44 year old female with left side chest pain since yesterday.  On exam, pain is reproduced with palpation along the  left border of the sternum as well as left axillary chest. EKG is unremarkable, troponin less than 2, hear score of 2.  CBC unremarkable, BMP shows hyperglycemia with glucose of 4 3 with normal bicarb and gap.  Plan is to give IV fluids to rehydrate patient and dry blood sugar down, may discharge if blood glucose is improving.   [LM]  2301 Care signed out to Dr. Florina Ou at change of shift pending blood sugar control.    [LM]    Clinical Course User Index [LM] Roque Lias   MDM Rules/Calculators/A&P HEAR Score: 2                        Final Clinical Impression(s) / ED Diagnoses Final diagnoses:  Chest wall pain  Hyperglycemia    Rx / DC Orders ED Discharge Orders    None       Roque Lias 11/04/19 2314    Molpus, Jenny Reichmann, MD 11/05/19 9485

## 2019-11-05 LAB — CBG MONITORING, ED: Glucose-Capillary: 140 mg/dL — ABNORMAL HIGH (ref 70–99)

## 2020-04-24 ENCOUNTER — Emergency Department (HOSPITAL_BASED_OUTPATIENT_CLINIC_OR_DEPARTMENT_OTHER)
Admission: EM | Admit: 2020-04-24 | Discharge: 2020-04-24 | Disposition: A | Discharge status: Home / Self Care | Payer: HRSA Program | Attending: Emergency Medicine | Admitting: Emergency Medicine

## 2020-04-24 ENCOUNTER — Other Ambulatory Visit: Payer: Self-pay

## 2020-04-24 ENCOUNTER — Encounter (HOSPITAL_BASED_OUTPATIENT_CLINIC_OR_DEPARTMENT_OTHER): Payer: Self-pay | Admitting: Emergency Medicine

## 2020-04-24 DIAGNOSIS — Z79899 Other long term (current) drug therapy: Secondary | ICD-10-CM | POA: Insufficient documentation

## 2020-04-24 DIAGNOSIS — E1042 Type 1 diabetes mellitus with diabetic polyneuropathy: Secondary | ICD-10-CM | POA: Insufficient documentation

## 2020-04-24 DIAGNOSIS — E1010 Type 1 diabetes mellitus with ketoacidosis without coma: Secondary | ICD-10-CM | POA: Insufficient documentation

## 2020-04-24 DIAGNOSIS — E785 Hyperlipidemia, unspecified: Secondary | ICD-10-CM | POA: Insufficient documentation

## 2020-04-24 DIAGNOSIS — E1069 Type 1 diabetes mellitus with other specified complication: Secondary | ICD-10-CM | POA: Insufficient documentation

## 2020-04-24 DIAGNOSIS — Z7982 Long term (current) use of aspirin: Secondary | ICD-10-CM | POA: Diagnosis not present

## 2020-04-24 DIAGNOSIS — F17210 Nicotine dependence, cigarettes, uncomplicated: Secondary | ICD-10-CM | POA: Diagnosis not present

## 2020-04-24 DIAGNOSIS — I10 Essential (primary) hypertension: Secondary | ICD-10-CM | POA: Insufficient documentation

## 2020-04-24 DIAGNOSIS — U071 COVID-19: Secondary | ICD-10-CM | POA: Insufficient documentation

## 2020-04-24 DIAGNOSIS — Z20822 Contact with and (suspected) exposure to covid-19: Secondary | ICD-10-CM

## 2020-04-24 DIAGNOSIS — R739 Hyperglycemia, unspecified: Secondary | ICD-10-CM

## 2020-04-24 LAB — URINALYSIS, ROUTINE W REFLEX MICROSCOPIC
Bilirubin Urine: NEGATIVE
Glucose, UA: >=500 mg/dL — AB
Hgb urine dipstick: NEGATIVE
Ketones, ur: 80 mg/dL — AB
Leukocytes,Ua: NEGATIVE
Nitrite: NEGATIVE
Protein, ur: NEGATIVE mg/dL
Specific Gravity, Urine: 1.015 (ref 1.005–1.030)
pH: 5 (ref 5.0–8.0)

## 2020-04-24 LAB — CBC WITH DIFFERENTIAL/PLATELET
Abs Immature Granulocytes: 0.01 10*3/uL (ref 0.00–0.07)
Basophils Absolute: 0 10*3/uL (ref 0.0–0.1)
Basophils Relative: 0 %
Eosinophils Absolute: 0 10*3/uL (ref 0.0–0.5)
Eosinophils Relative: 0 %
HCT: 37.8 % (ref 36.0–46.0)
Hemoglobin: 12.3 g/dL (ref 12.0–15.0)
Immature Granulocytes: 0 %
Lymphocytes Relative: 28 %
Lymphs Abs: 1.3 10*3/uL (ref 0.7–4.0)
MCH: 30.4 pg (ref 26.0–34.0)
MCHC: 32.5 g/dL (ref 30.0–36.0)
MCV: 93.6 fL (ref 80.0–100.0)
Monocytes Absolute: 0.3 10*3/uL (ref 0.1–1.0)
Monocytes Relative: 5 %
Neutro Abs: 3.2 10*3/uL (ref 1.7–7.7)
Neutrophils Relative %: 67 %
Platelets: 309 10*3/uL (ref 150–400)
RBC: 4.04 MIL/uL (ref 3.87–5.11)
RDW: 14.1 % (ref 11.5–15.5)
WBC: 4.8 10*3/uL (ref 4.0–10.5)
nRBC: 0 % (ref 0.0–0.2)

## 2020-04-24 LAB — URINALYSIS, MICROSCOPIC (REFLEX)

## 2020-04-24 LAB — BASIC METABOLIC PANEL
Anion gap: 15 (ref 5–15)
BUN: 31 mg/dL — ABNORMAL HIGH (ref 6–20)
CO2: 19 mmol/L — ABNORMAL LOW (ref 22–32)
Calcium: 8.9 mg/dL (ref 8.9–10.3)
Chloride: 100 mmol/L (ref 98–111)
Creatinine, Ser: 1.11 mg/dL — ABNORMAL HIGH (ref 0.44–1.00)
GFR, Estimated: >60 mL/min (ref >60)
Glucose, Bld: 593 mg/dL (ref 70–99)
Potassium: 5.1 mmol/L (ref 3.5–5.1)
Sodium: 134 mmol/L — ABNORMAL LOW (ref 135–145)

## 2020-04-24 LAB — HCG, QUANTITATIVE, PREGNANCY: hCG, Beta Chain, Quant, S: 1 m[IU]/mL (ref <5)

## 2020-04-24 LAB — CBG MONITORING, ED
Glucose-Capillary: 517 mg/dL (ref 70–99)
Glucose-Capillary: 461 mg/dL — ABNORMAL HIGH (ref 70–99)
Glucose-Capillary: 387 mg/dL — ABNORMAL HIGH (ref 70–99)

## 2020-04-24 LAB — SARS CORONAVIRUS 2 (TAT 6-24 HRS): SARS Coronavirus 2: POSITIVE — AB

## 2020-04-24 MED ORDER — INSULIN ASPART 100 UNIT/ML ~~LOC~~ SOLN
SUBCUTANEOUS | Status: AC
  Administered 2020-04-24: 6 [IU] via INTRAVENOUS
  Filled 2020-04-24: qty 6

## 2020-04-24 MED ORDER — INSULIN ASPART 100 UNIT/ML ~~LOC~~ SOLN: 2.0000 [IU] | SUBCUTANEOUS | Status: DC

## 2020-04-24 MED ORDER — INSULIN ASPART 100 UNIT/ML ~~LOC~~ SOLN
4.0000 [IU] | Freq: Once | SUBCUTANEOUS | Status: AC
  Administered 2020-04-24: 4 [IU] via SUBCUTANEOUS
  Filled 2020-04-24: qty 4

## 2020-04-24 MED ORDER — SODIUM CHLORIDE 0.9 % IV BOLUS
500.0000 mL | Freq: Once | INTRAVENOUS | Status: AC
  Administered 2020-04-24: 500 mL via INTRAVENOUS

## 2020-04-24 MED ORDER — INSULIN REGULAR HUMAN 100 UNIT/ML IJ SOLN
6.0000 [IU] | Freq: Once | INTRAMUSCULAR | Status: DC
  Filled 2020-04-24: qty 3

## 2020-04-24 MED ORDER — INSULIN ASPART 100 UNIT/ML ~~LOC~~ SOLN: 6.0000 [IU] | SUBCUTANEOUS | Status: AC

## 2020-04-24 NOTE — ED Notes (Signed)
Pt resting on stretcher, alert and oriented.

## 2020-04-24 NOTE — ED Triage Notes (Signed)
Pt reports hyperglycemic tonight with body aches, congestion and not feeling well. BS with EMS read "high"

## 2020-04-24 NOTE — Discharge Instructions (Signed)
Person Under Monitoring Name: Zoe Clay  Location: [place] [id number]   Infection Prevention Recommendations for Individuals Confirmed to have, or Being Evaluated for, 2019 Novel Coronavirus (COVID-19) Infection Who Receive Care at Home  Individuals who are confirmed to have, or are being evaluated for, COVID-19 should follow the prevention steps below until a healthcare provider or local or state health department says they can return to normal activities.  Stay home except to get medical care You should restrict activities outside your home, except for getting medical care. Do not go to work, school, or public areas, and do not use public transportation or taxis.  Call ahead before visiting your doctor Before your medical appointment, call the healthcare provider and tell them that you have, or are being evaluated for, COVID-19 infection. This will help the healthcare providers office take steps to keep other people from getting infected. Ask your healthcare provider to call the local or state health department.  Monitor your symptoms Seek prompt medical attention if your illness is worsening (e.g., difficulty breathing). Before going to your medical appointment, call the healthcare provider and tell them that you have, or are being evaluated for, COVID-19 infection. Ask your healthcare provider to call the local or state health department.  Wear a facemask You should wear a facemask that covers your nose and mouth when you are in the same room with other people and when you visit a healthcare provider. People who live with or visit you should also wear a facemask while they are in the same room with you.  Separate yourself from other people in your home As much as possible, you should stay in a different room from other people in your home. Also, you should use a separate bathroom, if available.  Avoid sharing household items You should  not share dishes, drinking glasses, cups, eating utensils, towels, bedding, or other items with other people in your home. After using these items, you should wash them thoroughly with soap and water.  Cover your coughs and sneezes Cover your mouth and nose with a tissue when you cough or sneeze, or you can cough or sneeze into your sleeve. Throw used tissues in a lined trash can, and immediately wash your hands with soap and water for at least 20 seconds or use an alcohol-based hand rub.  Wash your [facility] your hands often and thoroughly with soap and water for at least 20 seconds. You can use an alcohol-based hand sanitizer if soap and water are not available and if your hands are not visibly dirty. Avoid touching your eyes, nose, and mouth with unwashed hands.   Prevention Steps for Caregivers and Household Members of Individuals Confirmed to have, or Being Evaluated for, COVID-19 Infection Being Cared for in the Home  If you live with, or provide care at home for, a person confirmed to have, or being evaluated for, COVID-19 infection please follow these guidelines to prevent infection:  Follow healthcare providers instructions Make sure that you understand and can help the patient follow any healthcare provider instructions for all care.  Provide for the patients basic needs You should help the patient with basic needs in the home and provide support for getting groceries, prescriptions, and other personal needs.  Monitor the patients symptoms If they are getting sicker, call his or her medical provider and tell them that the patient has, or is being evaluated for, COVID-19 infection. This will help  the healthcare providers office take steps to keep other people from getting infected. Ask the healthcare provider to call the local or state health department.  Limit the number of people who have contact with the patient If possible, have only one caregiver for the  patient. Other household members should stay in another home or place of residence. If this is not possible, they should stay in another room, or be separated from the patient as much as possible. Use a separate bathroom, if available. Restrict visitors who do not have an essential need to be in the home.  Keep older adults, very young children, and other sick people away from the patient Keep older adults, very young children, and those who have compromised immune systems or chronic health conditions away from the patient. This includes people with chronic heart, lung, or kidney conditions, diabetes, and cancer.  Ensure good ventilation Make sure that shared spaces in the home have good air flow, such as from an air conditioner or an opened window, weather permitting.  Wash your hands often Wash your hands often and thoroughly with soap and water for at least 20 seconds. You can use an alcohol based hand sanitizer if soap and water are not available and if your hands are not visibly dirty. Avoid touching your eyes, nose, and mouth with unwashed hands. Use disposable paper towels to dry your hands. If not available, use dedicated cloth towels and replace them when they become wet.  Wear a facemask and gloves Wear a disposable facemask at all times in the room and gloves when you touch or have contact with the patients blood, body fluids, and/or secretions or excretions, such as sweat, saliva, sputum, nasal mucus, vomit, urine, or feces.  Ensure the mask fits over your nose and mouth tightly, and do not touch it during use. Throw out disposable facemasks and gloves after using them. Do not reuse. Wash your hands immediately after removing your facemask and gloves. If your personal clothing becomes contaminated, carefully remove clothing and launder. Wash your hands after handling contaminated clothing. Place all used disposable facemasks, gloves, and other waste in a lined container before  disposing them with other household waste. Remove gloves and wash your hands immediately after handling these items.  Do not share dishes, glasses, or other household items with the patient Avoid sharing household items. You should not share dishes, drinking glasses, cups, eating utensils, towels, bedding, or other items with a patient who is confirmed to have, or being evaluated for, COVID-19 infection. After the person uses these items, you should wash them thoroughly with soap and water.  Wash laundry thoroughly Immediately remove and wash clothes or bedding that have blood, body fluids, and/or secretions or excretions, such as sweat, saliva, sputum, nasal mucus, vomit, urine, or feces, on them. Wear gloves when handling laundry from the patient. Read and follow directions on labels of laundry or clothing items and detergent. In general, wash and dry with the warmest temperatures recommended on the label.  Clean all areas the individual has used often Clean all touchable surfaces, such as counters, tabletops, doorknobs, bathroom fixtures, toilets, phones, keyboards, tablets, and bedside tables, every day. Also, clean any surfaces that may have blood, body fluids, and/or secretions or excretions on them. Wear gloves when cleaning surfaces the patient has come in contact with. Use a diluted bleach solution (e.g., dilute bleach with 1 part bleach and 10 parts water) or a household disinfectant with a label that says EPA-registered for coronaviruses.  To make a bleach solution at home, add 1 tablespoon of bleach to 1 quart (4 cups) of water. For a larger supply, add  cup of bleach to 1 gallon (16 cups) of water. Read labels of cleaning products and follow recommendations provided on product labels. Labels contain instructions for safe and effective use of the cleaning product including precautions you should take when applying the product, such as wearing gloves or eye protection and making sure you  have good ventilation during use of the product. Remove gloves and wash hands immediately after cleaning.  Monitor yourself for signs and symptoms of illness Caregivers and household members are considered close contacts, should monitor their health, and will be asked to limit movement outside of the home to the extent possible. Follow the monitoring steps for close contacts listed on the symptom monitoring form.   ? If you have additional questions, contact your local health department or call the epidemiologist on call at [phone] (available 24/7). ? This guidance is subject to change. For the most up-to-date guidance from [facility], please refer to their website: [email or web address]

## 2020-04-24 NOTE — ED Provider Notes (Addendum)
[facility] Provider Note   CSN: [id number] Arrival date & time: 04/24/20  0340     History Chief Complaint  Patient presents with  . Hyperglycemia    Zoe Clay is a 45 y.o. female.  The history is provided by the patient.  Hyperglycemia Blood sugar level PTA:  High Severity:  Severe Onset quality:  Gradual Duration:  1 day Progression:  Worsening Chronicity:  Recurrent Diabetes status:  Controlled with insulin Time since last antidiabetic medication:  8 hours Context: recent illness   Context: not insulin pump use   Relieved by:  Nothing Ineffective treatments:  None tried Associated symptoms: fatigue   Associated symptoms: no abdominal pain, no chest pain, no diaphoresis, no dizziness, no fever, no shortness of breath and no weakness   Associated symptoms comment:  Body aches and congestion and exposure to people with similar Risk factors: hx of DKA   Patient with poorly controlled type 1 diabetes presents with 2-3 days of body aches and congestion and not feeling well.  Her sugars have been elevated but was feeling worse at work and EMS checked sugar and it read "high".  She came here for evaluation.  No f/c/r.  No cough.  No SOB.  No emesis.       Past Medical History:  Diagnosis Date  . Bipolar 1 disorder ([place])   . Diabetes mellitus   . High cholesterol   . Hypertension   . Peripheral neuropathy   . Schizophrenia, acute ([place])   . Seizures ([place])    last seizure May 2016  . Thyroid disease     Patient Active Problem List   Diagnosis Date Noted  . DKA (diabetic ketoacidoses) 06/20/2012  . Nausea with vomiting 06/20/2012  . 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 Seizure disorder ([place]) 06/20/2012  . Unspecified hypothyroidism 06/20/2012  . Hyponatremia 06/20/2012    Past Surgical History:  Procedure Laterality Date  . ANKLE SURGERY Bilateral 2009  . TUBAL LIGATION   1999     OB History    Gravida  4   Para  3   Term  3   Preterm  0   AB  1   Living  3     SAB  1   IAB  0   Ectopic  0   Multiple  0   Live Births  3           Family History  Problem Relation Age of Onset  . Diabetes Father   . Cancer Maternal Grandfather     Social History   Tobacco Use  . Smoking status: Current Every Day Smoker    Packs/day: 0.25    Types: Cigarettes  . Smokeless tobacco: Never Used  Vaping Use  . Vaping Use: Never used  Substance Use Topics  . Alcohol use: No  . Drug use: No    Home Medications Prior to Admission medications   Medication Sig Start Date End Date Taking? Authorizing Provider  aspirin 81 MG EC tablet Take by mouth. 04/10/16   [provider]  DULoxetine (CYMBALTA) 60 MG capsule Take 60 mg by mouth daily.    [provider]  gabapentin (NEURONTIN) 600 MG tablet Take 1 tablet (600 mg total) by mouth 2 (two) times daily. 01/10/15   Katheren Shams, DO  insulin aspart (NOVOLOG) 100 UNIT/ML injection Inject into the skin 3 (three) times daily before meals.  [provider]  insulin glargine (LANTUS) 100 UNIT/ML injection Inject 15 Units into the skin daily. 06/22/12   Regalado, Belkys A, MD  levothyroxine (SYNTHROID, LEVOTHROID) 125 MCG tablet Take 125 mcg by mouth daily.      [provider]  lisinopril (PRINIVIL,ZESTRIL) 5 MG tablet Take 5 mg by mouth daily.    [provider]  phenytoin (DILANTIN) 100 MG ER capsule Take 200 mg by mouth 2 (two) times daily.     [provider]  simvastatin (ZOCOR) 20 MG tablet Take 20 mg by mouth every evening.    [provider]    Allergies    Contrast media [iodinated diagnostic agents] and Iohexol  Review of Systems   Review of Systems  Constitutional: Positive for fatigue. Negative for diaphoresis and fever.  HENT: Positive for congestion. Negative for voice change.   Eyes: Negative for visual disturbance.   Respiratory: Negative for shortness of breath.   Cardiovascular: Negative for chest pain.  Gastrointestinal: Negative for abdominal pain.  Genitourinary: Negative for difficulty urinating.  Musculoskeletal: Positive for myalgias.  Neurological: Negative for dizziness and weakness.  Psychiatric/Behavioral: Negative for agitation.  All other systems reviewed and are negative.   Physical Exam Updated Vital Signs BP 110/77 (BP Location: Left Arm)   Pulse 99   Temp 98.8 F (37.1 C) (Oral)   Resp 16   Ht 5\' 5"  (1.651 m)   Wt 68 kg   SpO2 100%   BMI 24.95 kg/m   Physical Exam Vitals and nursing note reviewed.  Constitutional:      General: She is not in acute distress.    Appearance: Normal appearance.  HENT:     Head: Normocephalic and atraumatic.     Nose: Nose normal.  Eyes:     Conjunctiva/sclera: Conjunctivae normal.     Pupils: Pupils are equal, round, and reactive to light.  Cardiovascular:     Rate and Rhythm: Normal rate and regular rhythm.     Pulses: Normal pulses.     Heart sounds: Normal heart sounds.  Pulmonary:     Effort: Pulmonary effort is normal.     Breath sounds: Normal breath sounds.  Abdominal:     General: Abdomen is flat. Bowel sounds are normal.     Palpations: Abdomen is soft.     Tenderness: There is no abdominal tenderness. There is no guarding.  Musculoskeletal:        General: Normal range of motion.     Cervical back: Normal range of motion and neck supple.  Skin:    General: Skin is warm and dry.     Capillary Refill: Capillary refill takes less than 2 seconds.  Neurological:     General: No focal deficit present.     Mental Status: She is alert and oriented to person, place, and time.     Deep Tendon Reflexes: Reflexes normal.  Psychiatric:        Mood and Affect: Mood normal.        Behavior: Behavior normal.     ED Results / Procedures / Treatments   Labs (all labs ordered are listed, but only abnormal results are  displayed) Results for orders placed or performed during the hospital encounter of 04/24/20  CBC with Differential/Platelet  Result Value Ref Range   WBC 4.8 4.0 - 10.5 K/uL   RBC 4.04 3.87 - 5.11 MIL/uL   Hemoglobin 12.3 12.0 - 15.0 g/dL   HCT 37.8 36.0 - 46.0 %  MCV 93.6 80.0 - 100.0 fL   MCH 30.4 26.0 - 34.0 pg   MCHC 32.5 30.0 - 36.0 g/dL   RDW 14.1 11.5 - 15.5 %   Platelets 309 150 - 400 K/uL   nRBC 0.0 0.0 - 0.2 %   Neutrophils Relative % 67 %   Neutro Abs 3.2 1.7 - 7.7 K/uL   Lymphocytes Relative 28 %   Lymphs Abs 1.3 0.7 - 4.0 K/uL   Monocytes Relative 5 %   Monocytes Absolute 0.3 0.1 - 1.0 K/uL   Eosinophils Relative 0 %   Eosinophils Absolute 0.0 0.0 - 0.5 K/uL   Basophils Relative 0 %   Basophils Absolute 0.0 0.0 - 0.1 K/uL   Immature Granulocytes 0 %   Abs Immature Granulocytes 0.01 0.00 - 0.07 K/uL  Basic metabolic panel  Result Value Ref Range   Sodium 134 (L) 135 - 145 mmol/L   Potassium 5.1 3.5 - 5.1 mmol/L   Chloride 100 98 - 111 mmol/L   CO2 19 (L) 22 - 32 mmol/L   Glucose, Bld 593 (HH) 70 - 99 mg/dL   BUN 31 (H) 6 - 20 mg/dL   Creatinine, Ser 1.11 (H) 0.44 - 1.00 mg/dL   Calcium 8.9 8.9 - 10.3 mg/dL   GFR, Estimated >60 >60 mL/min   Anion gap 15 5 - 15  POC CBG, ED  Result Value Ref Range   Glucose-Capillary 517 (HH) 70 - 99 mg/dL   Comment 1 Call MD NNP PA CNM    No results found.  Radiology No results found.  Procedures Procedures (including critical care time)  Medications Ordered in ED Medications  sodium chloride 0.9 % bolus 500 mL (has no administration in time range)    ED Course  I have reviewed the triage vital signs and the nursing notes.  Pertinent labs & imaging results that were available during my care of the patient were reviewed by me and considered in my medical decision making (see chart for details).    Anion gap is top normal.  Hydrated with 1500 cc of IVF in the ED and given IV insulin in the ED.  COVID test sent  and will be in my chart.  Vital signs and exam are normal and reassuring.  There are no signs of sepsis.  Patient will be hydrated with remainder of IVF and discharged when urinalysis returns.  Signs out to Dr. Karle Starch pending urine and fluids, improved glucose.    TAMEA BAI was evaluated in Emergency Department on 04/24/2020 for the symptoms described in the history of present illness. She was evaluated in the context of the global COVID-19 pandemic, which necessitated consideration that the patient might be at risk for infection with the SARS-CoV-2 virus that causes COVID-19. Institutional protocols and algorithms that pertain to the evaluation of patients at risk for COVID-19 are in a state of rapid change based on information released by regulatory bodies including the CDC and federal and state organizations. These policies and algorithms were followed during the patient's care in the ED.  Final Clinical Impression(s) / ED Diagnoses   Signed out to Dr. Karle Starch,  Pending urinalysis and glucose recheck.      Ayzia Day, MD 04/24/20 [phone]

## 2020-04-25 ENCOUNTER — Telehealth: Payer: Self-pay | Admitting: Family

## 2020-04-25 NOTE — Telephone Encounter (Signed)
Called to discuss with patient about COVID-19 symptoms and the use of one of the available treatments for those with mild to moderate Covid symptoms and at a high risk of hospitalization.  Pt appears to qualify for outpatient treatment due to co-morbid conditions and/or a member of an at-risk group in accordance with the FDA Emergency Use Authorization.    Symptom onset: Unknown Positive test in Epic 04/24/20 Vaccinated: Unknown Booster? Unknown Qualifiers: DM1, ethnicity,   Unable to reach pt - Left VM 04/25/20 at 4:39pm. Sent MyChart message as well.   Loel Dubonnet

## 2020-12-31 ENCOUNTER — Emergency Department (HOSPITAL_BASED_OUTPATIENT_CLINIC_OR_DEPARTMENT_OTHER): Payer: Self-pay

## 2020-12-31 ENCOUNTER — Other Ambulatory Visit: Payer: Self-pay

## 2020-12-31 ENCOUNTER — Encounter (HOSPITAL_BASED_OUTPATIENT_CLINIC_OR_DEPARTMENT_OTHER): Payer: Self-pay | Admitting: Emergency Medicine

## 2020-12-31 ENCOUNTER — Emergency Department (HOSPITAL_BASED_OUTPATIENT_CLINIC_OR_DEPARTMENT_OTHER)
Admission: EM | Admit: 2020-12-31 | Discharge: 2021-01-01 | Disposition: A | Discharge status: Home / Self Care | Payer: Self-pay | Attending: Emergency Medicine | Admitting: Emergency Medicine

## 2020-12-31 DIAGNOSIS — M79605 Pain in left leg: Secondary | ICD-10-CM | POA: Insufficient documentation

## 2020-12-31 DIAGNOSIS — Z794 Long term (current) use of insulin: Secondary | ICD-10-CM | POA: Insufficient documentation

## 2020-12-31 DIAGNOSIS — F17210 Nicotine dependence, cigarettes, uncomplicated: Secondary | ICD-10-CM | POA: Insufficient documentation

## 2020-12-31 DIAGNOSIS — Z7982 Long term (current) use of aspirin: Secondary | ICD-10-CM | POA: Insufficient documentation

## 2020-12-31 DIAGNOSIS — E1010 Type 1 diabetes mellitus with ketoacidosis without coma: Secondary | ICD-10-CM | POA: Insufficient documentation

## 2020-12-31 DIAGNOSIS — Z79899 Other long term (current) drug therapy: Secondary | ICD-10-CM | POA: Insufficient documentation

## 2020-12-31 DIAGNOSIS — I10 Essential (primary) hypertension: Secondary | ICD-10-CM | POA: Insufficient documentation

## 2020-12-31 IMAGING — US US EXTREM LOW VENOUS*L*
1 series · 14 of 24 positions shown · non-contrast
Comparison: None.

CLINICAL DATA: Left leg pain.

EXAM:
LEFT LOWER EXTREMITY VENOUS DOPPLER ULTRASOUND
TECHNIQUE: Gray-scale sonography with compression, as well as color and duplex
ultrasound, were performed to evaluate the deep venous system(s)
from the level of the common femoral vein through the popliteal and
proximal calf veins.

[Series 1: us extrem low venous*left* · 14 of 33 slices shown]
[im 1/33]
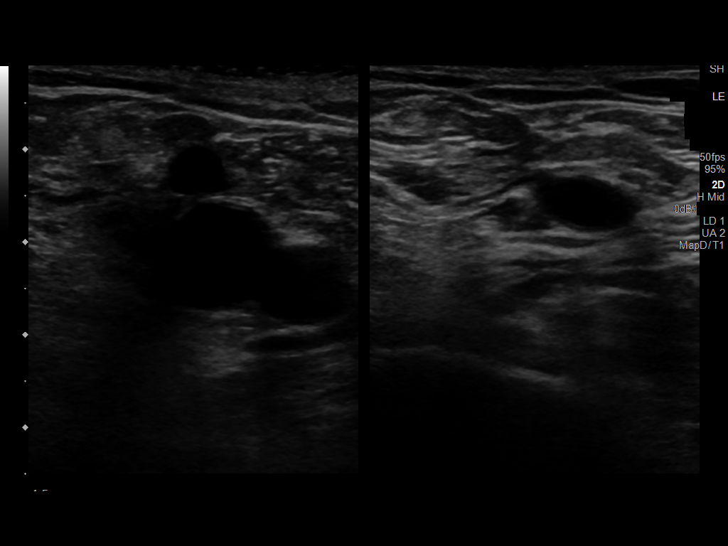
[im 3/33]
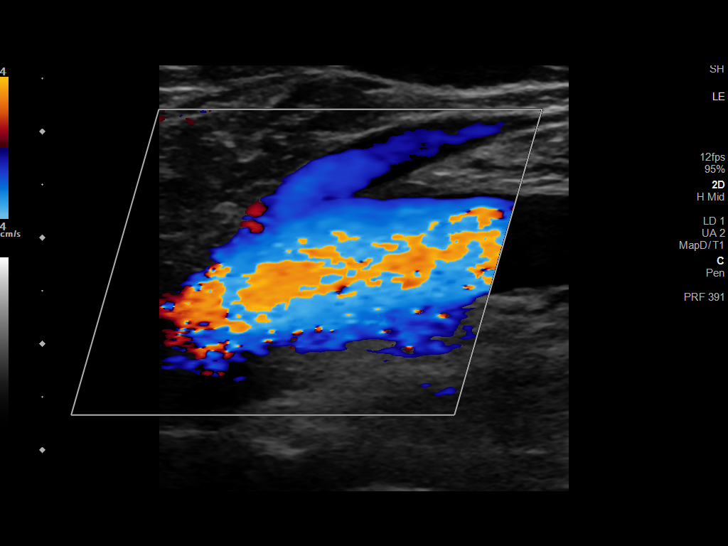
[im 6/33]
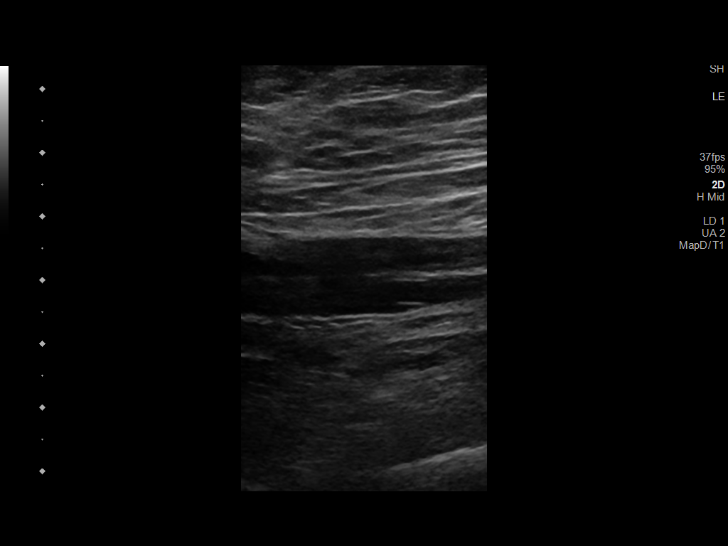
[im 9/33]
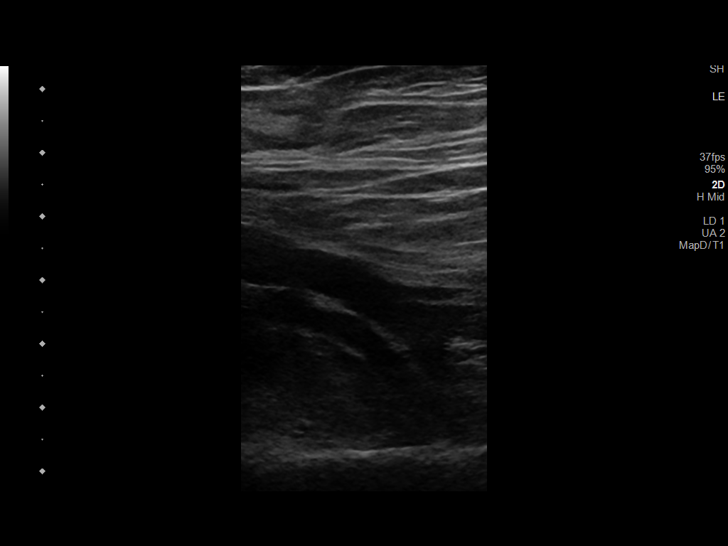
[im 10/33]
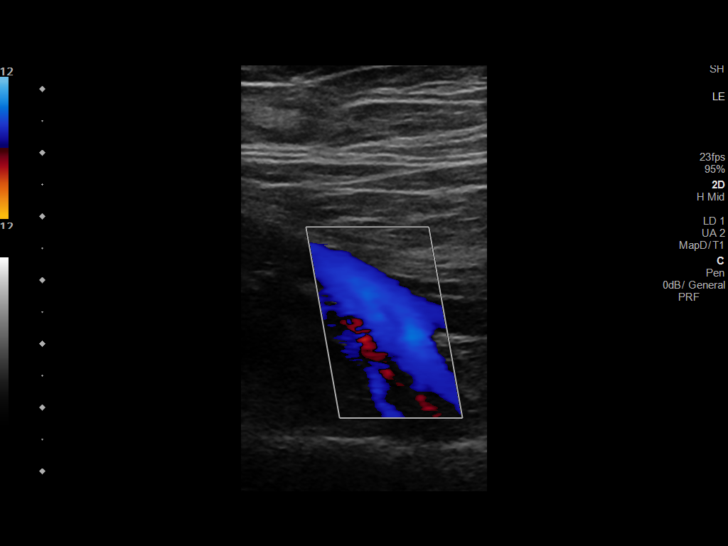
[im 13/33]
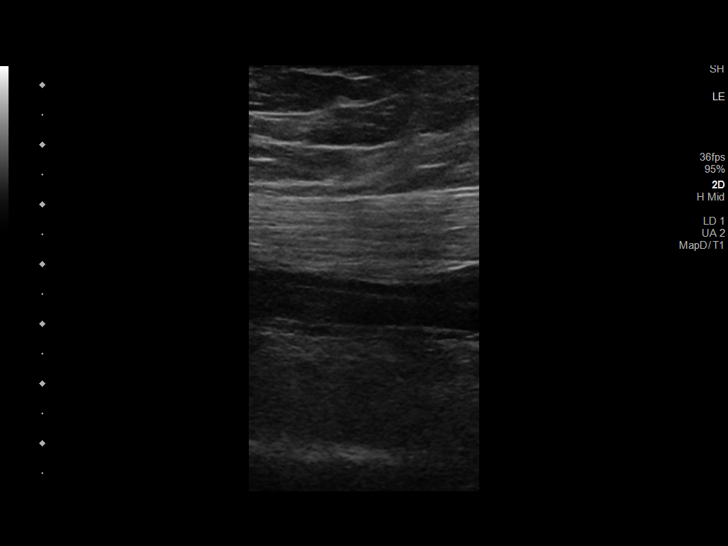
[im 16/33]
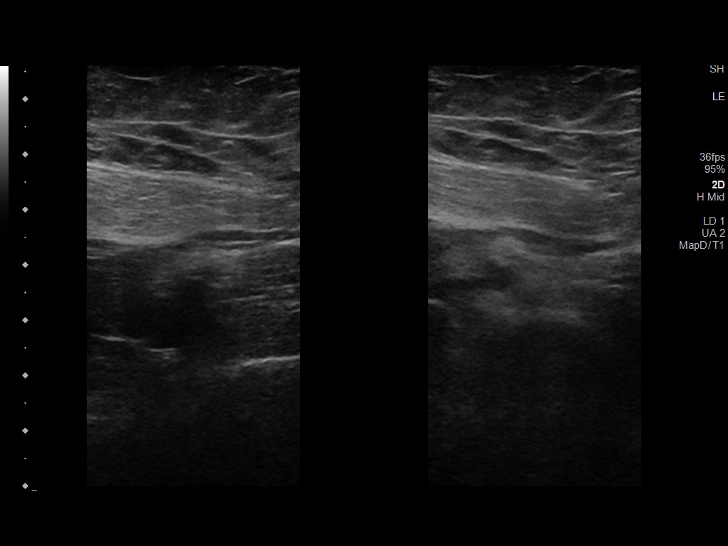
[im 17/33]
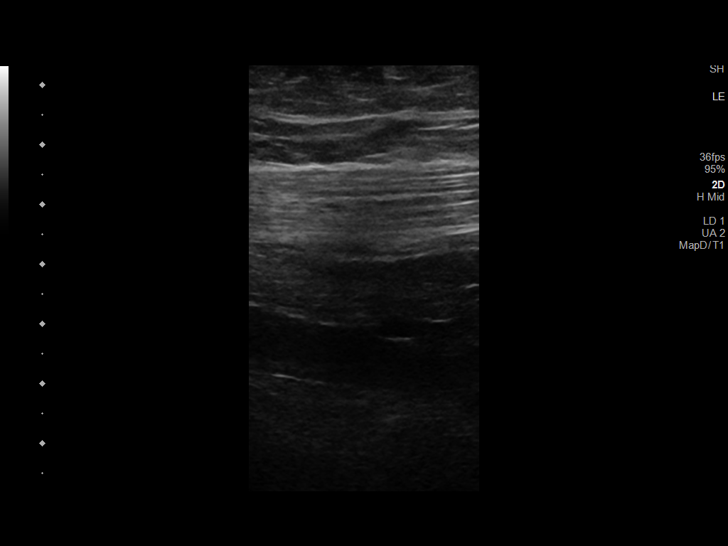
[im 20/33]
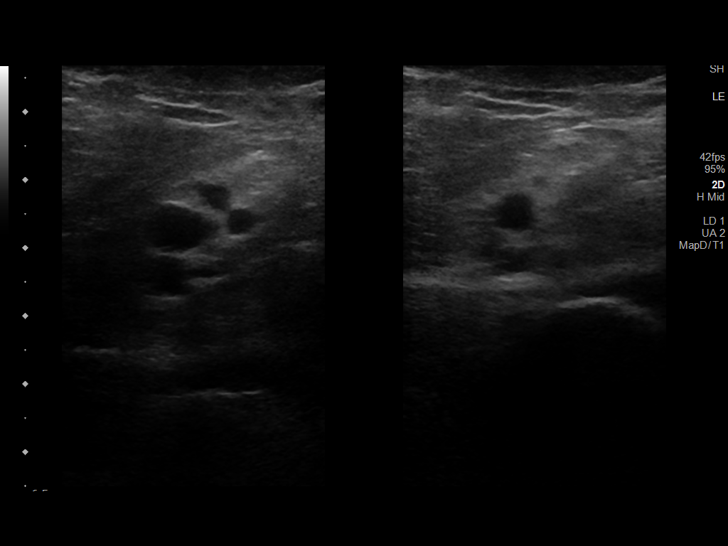
[im 23/33]
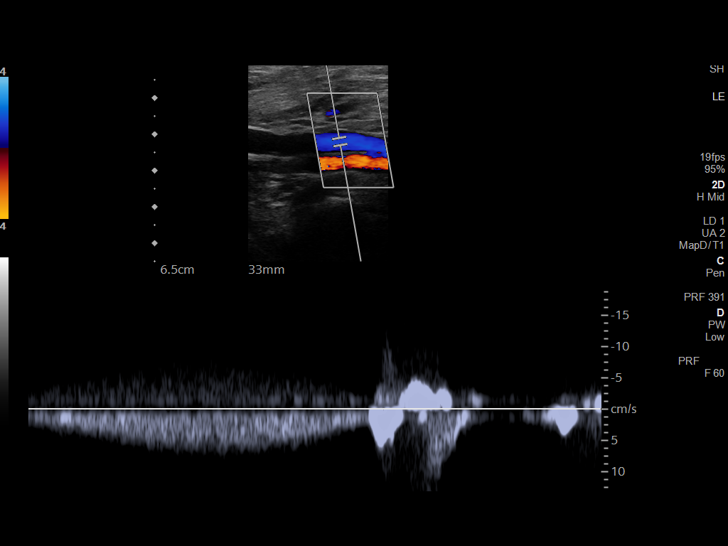
[im 26/33]
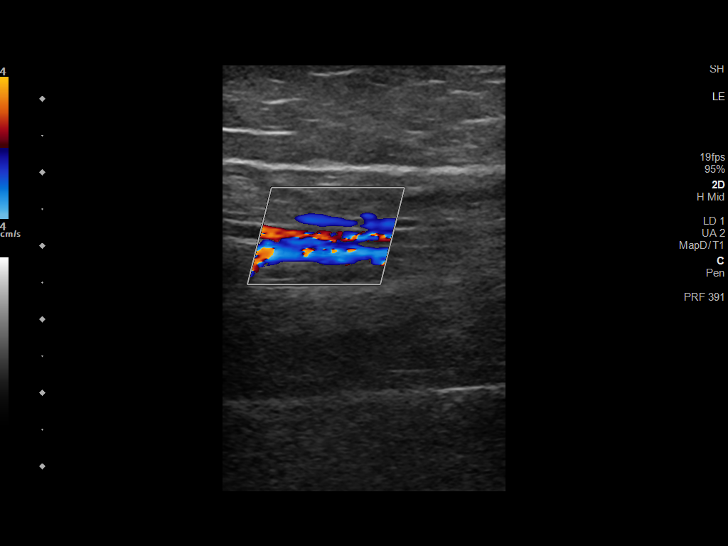
[im 27/33]
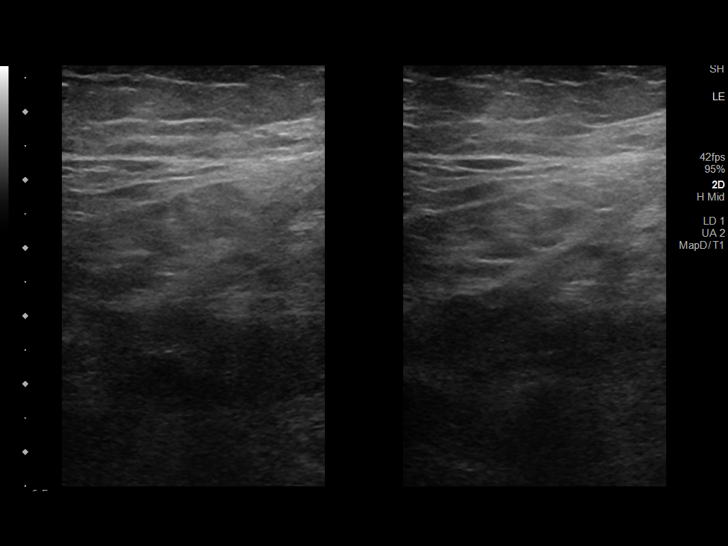
[im 30/33]
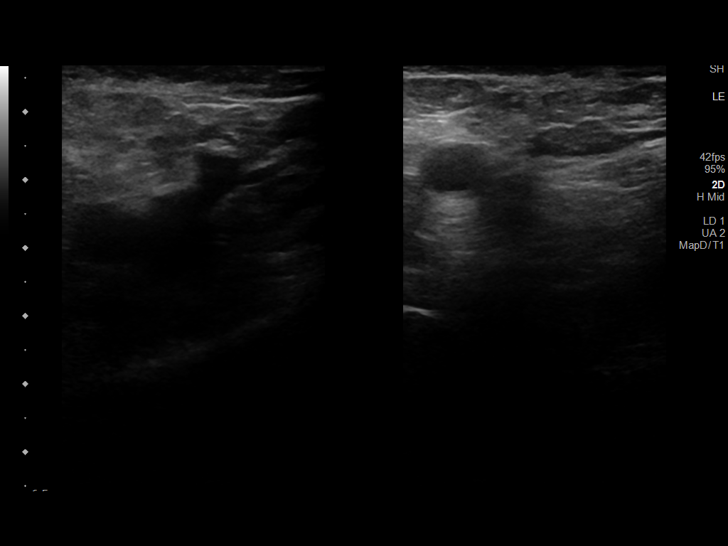
[im 33/33]
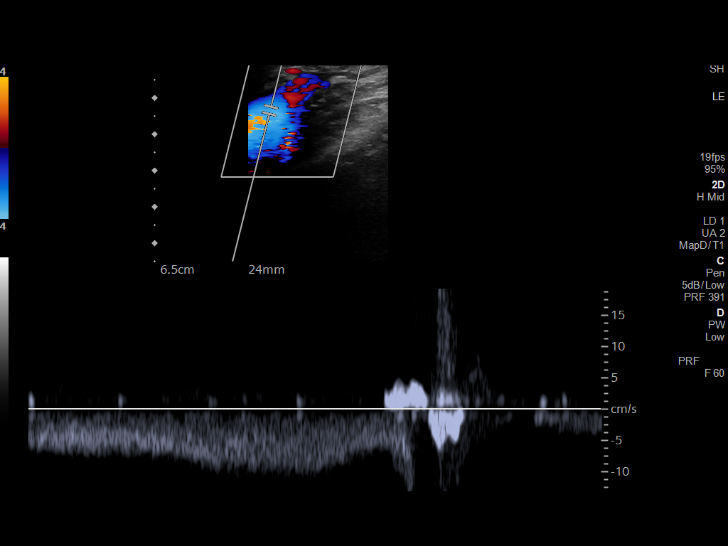

[14 of 24 positions shown; findings below may reference images not displayed]

FINDINGS: VENOUS

Normal compressibility of the common femoral, superficial femoral,
and popliteal veins, as well as the visualized calf veins.
Visualized portions of profunda femoral vein and great saphenous
vein unremarkable. No filling defects to suggest DVT on grayscale or
color Doppler imaging. Doppler waveforms show normal direction of
venous flow, normal respiratory plasticity and response to
augmentation.

Limited views of the contralateral common femoral vein are
unremarkable.

OTHER

None.

Limitations: none
IMPRESSION: Negative.

## 2020-12-31 IMAGING — DX DG TIBIA/FIBULA 2V*L*
4 series · 4 of 4 positions shown · non-contrast
Comparison: None.

CLINICAL DATA: I 0 placed.  Left leg pain.

EXAM:
LEFT TIBIA AND FIBULA - 2 VIEW

[tibia ap (1 of 2)]
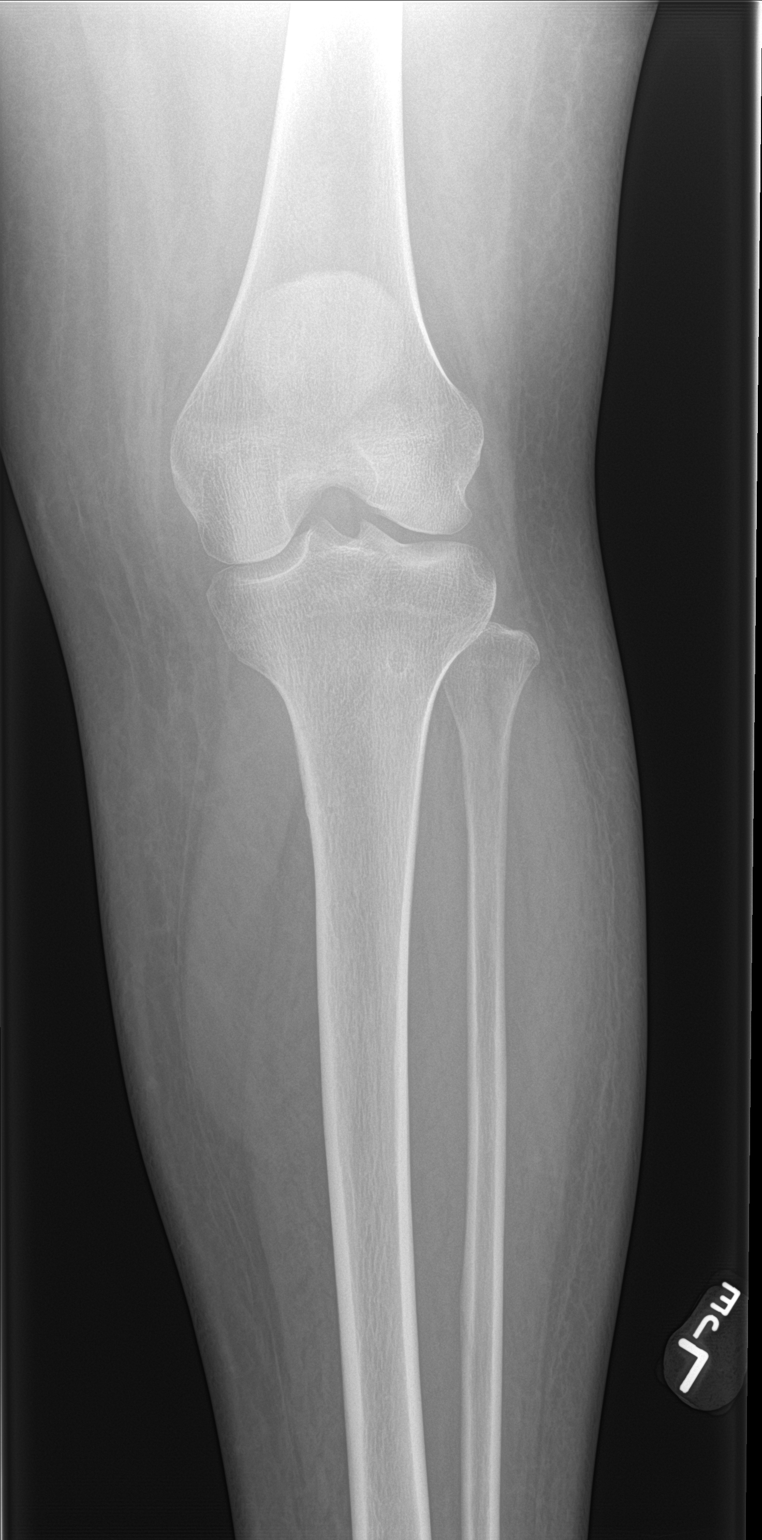

[tibia ap (2 of 2)]
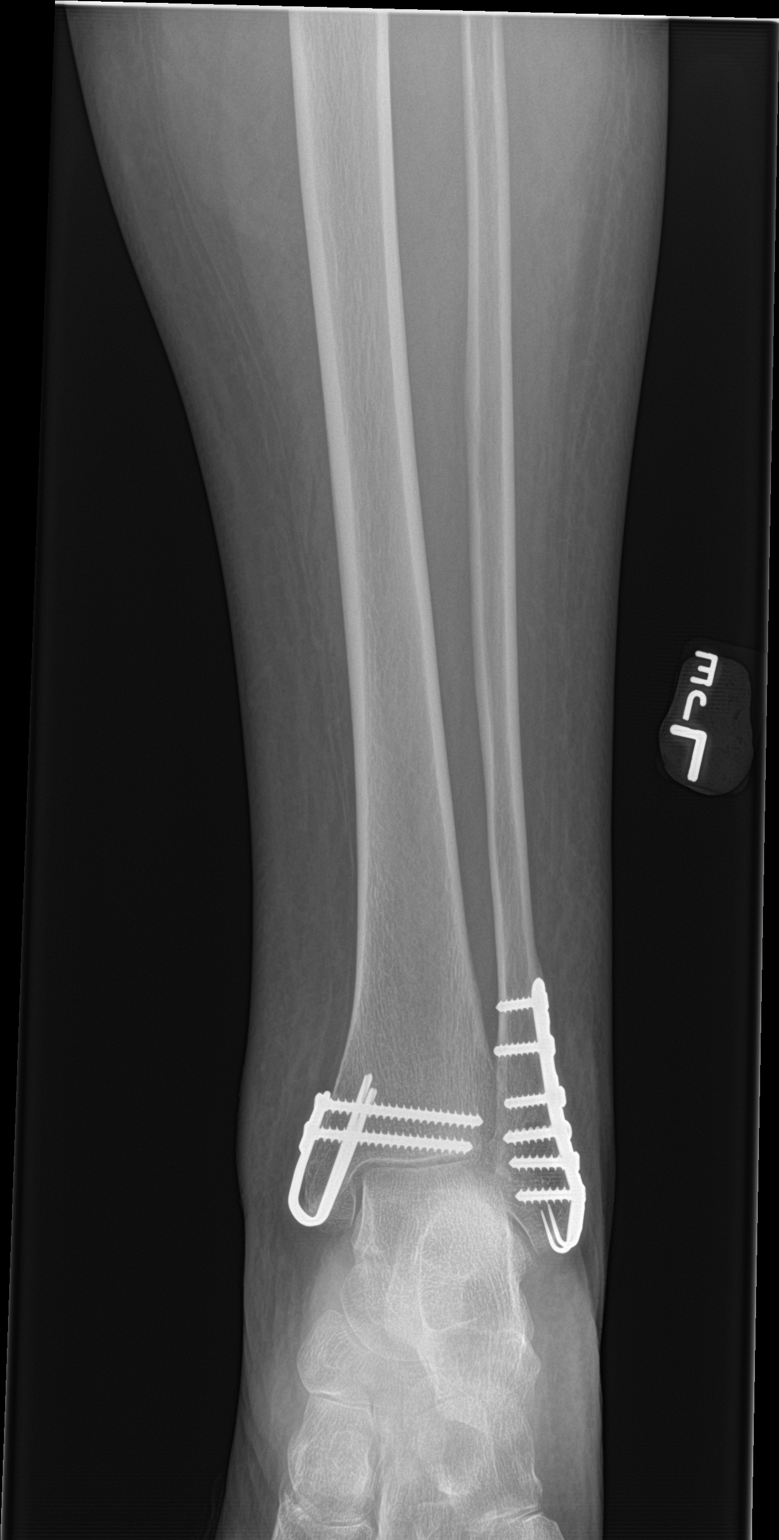

[tibia lat (1 of 2)]
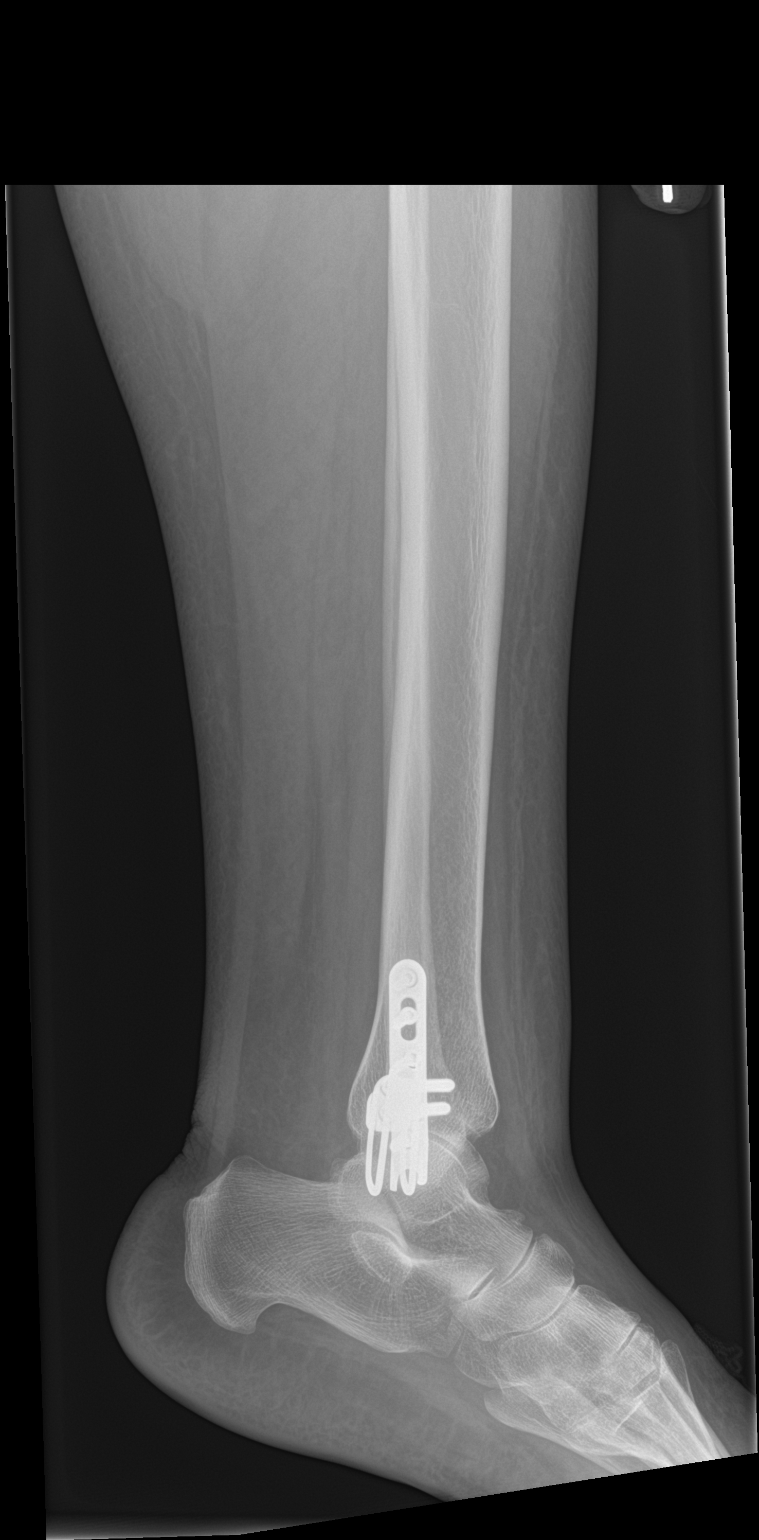

[tibia lat (2 of 2)]
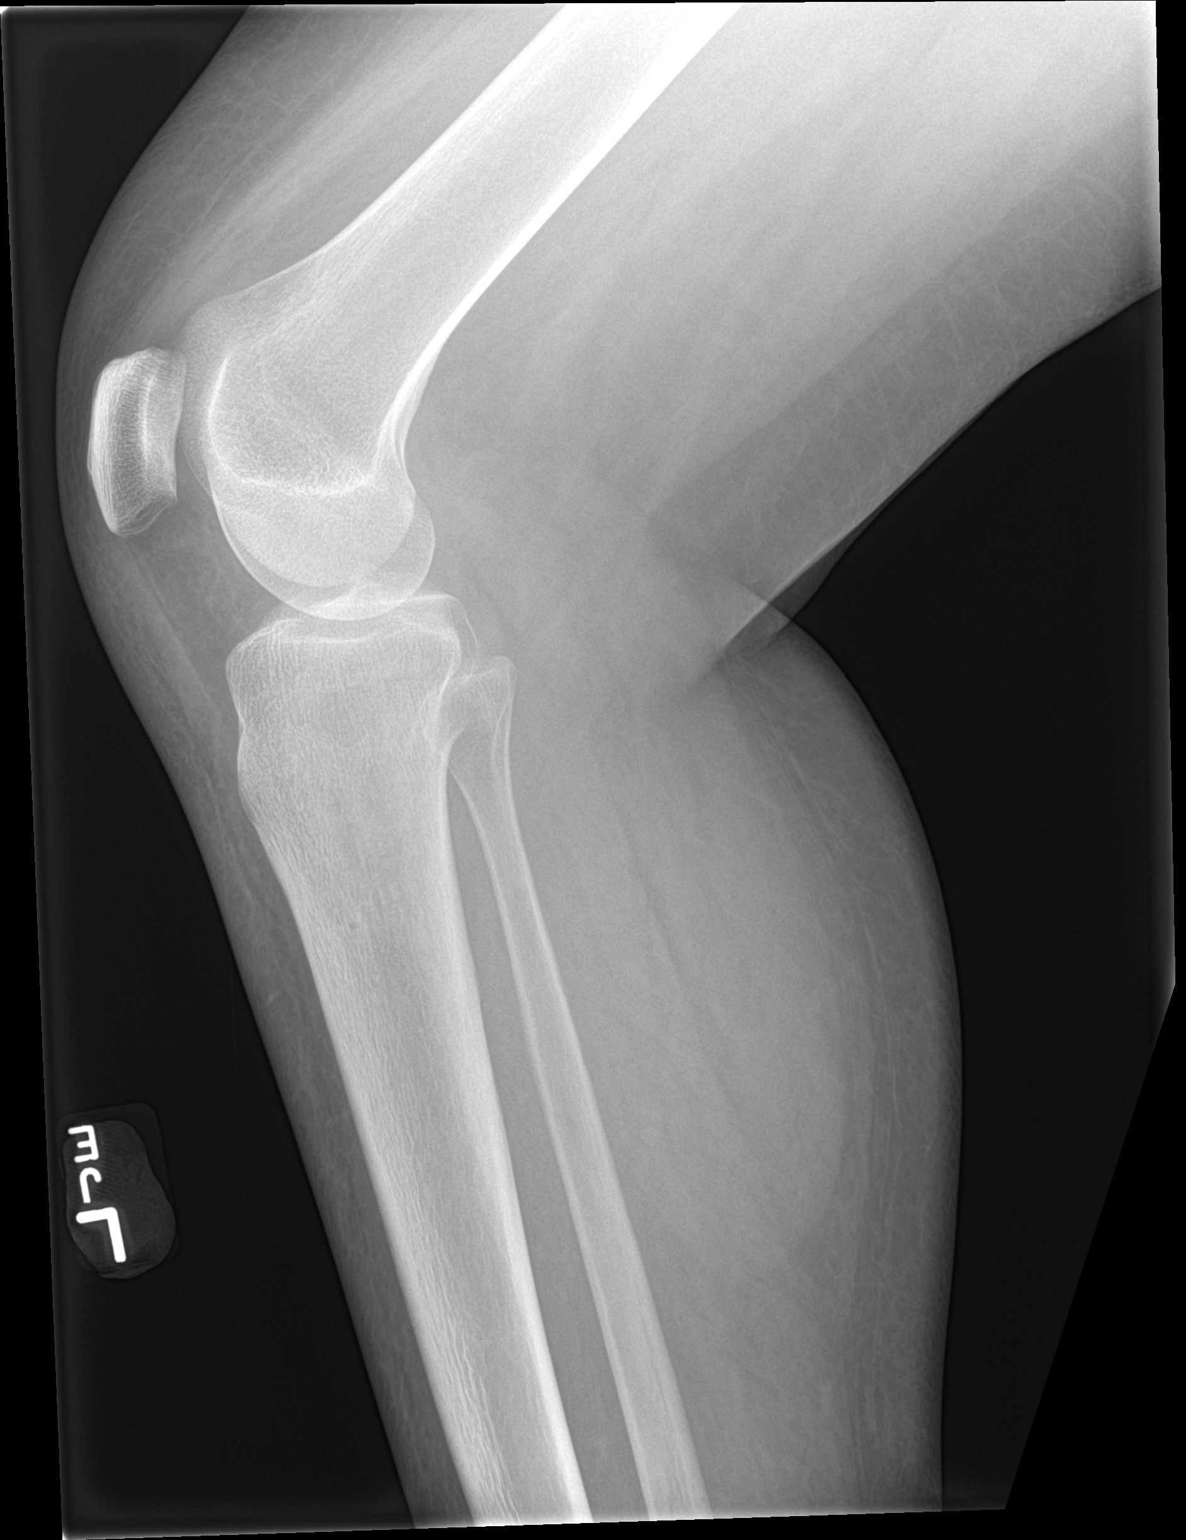

[4 of 4 positions shown; findings below may reference images not displayed]

FINDINGS: Rounded lucency in the proximal tibial metaphysis likely site of
prior LIEU. No associated erosive change or periosteal reaction. No
fracture. Surgical change in the medial and lateral malleoli. Knee
and ankle alignment are maintained. Mild generalized soft tissue
edema.
IMPRESSION: Rounded lucency in the proximal tibial metaphysis is likely site of
prior LIEU. No associated erosive change or periosteal reaction. Mild
generalized soft tissue edema.

## 2020-12-31 NOTE — ED Triage Notes (Signed)
Pt c/o left leg pain from hip to toe. Pt had syncopal episode x 2 weeks ago and fell. Pt also states she had IO placed in left leg at that time.

## 2021-01-01 ENCOUNTER — Encounter (HOSPITAL_BASED_OUTPATIENT_CLINIC_OR_DEPARTMENT_OTHER): Payer: Self-pay | Admitting: Emergency Medicine

## 2021-01-01 MED ORDER — DICLOFENAC SODIUM ER 100 MG PO TB24: 100.0000 mg | ORAL_TABLET | Freq: Every day | ORAL | 0 refills | Status: AC

## 2021-01-01 MED ORDER — DOXYCYCLINE HYCLATE 100 MG PO CAPS: 100.0000 mg | ORAL_CAPSULE | Freq: Two times a day (BID) | ORAL | 0 refills | Status: DC

## 2021-01-01 MED ORDER — IBUPROFEN 800 MG PO TABS
800.0000 mg | ORAL_TABLET | Freq: Once | ORAL | Status: AC
  Administered 2021-01-01: 800 mg via ORAL
  Filled 2021-01-01: qty 1

## 2021-01-01 MED ORDER — LIDOCAINE 5 % EX PTCH: 1.0000 | MEDICATED_PATCH | CUTANEOUS | 0 refills | Status: AC

## 2021-01-01 MED ORDER — DOXYCYCLINE HYCLATE 100 MG PO CAPS
100.0000 mg | ORAL_CAPSULE | Freq: Two times a day (BID) | ORAL | 0 refills | Status: DC
Start: 2021-01-01 — End: 2021-03-05

## 2021-01-01 MED ORDER — DOXYCYCLINE HYCLATE 100 MG PO TABS
100.0000 mg | ORAL_TABLET | Freq: Once | ORAL | Status: AC
  Administered 2021-01-01: 100 mg via ORAL
  Filled 2021-01-01: qty 1

## 2021-01-01 NOTE — ED Provider Notes (Signed)
[facility] Provider Note   CSN: [id number] Arrival date & time: 12/31/20  2016     History Chief Complaint  Patient presents with   Leg Pain    Zoe Clay is a 45 y.o. female.  The history is provided by the patient.  Leg Pain Location:  Hip, leg, knee, ankle and foot Time since incident:  2 weeks Lower extremity injury: io placed in the tibia.   Leg location:  L leg Pain details:    Quality:  Aching   Radiates to:  Back   Severity:  Moderate   Onset quality:  Sudden   Duration:  2 weeks   Timing:  Constant Chronicity:  New Foreign body present:  No foreign bodies Tetanus status:  Up to date Relieved by:  Nothing Worsened by:  Nothing Ineffective treatments:  None tried Associated symptoms: no back pain, no decreased ROM, no fatigue, no fever, no itching, no muscle weakness, no neck pain, no numbness, no stiffness, no swelling and no tingling   Risk factors: no concern for non-accidental trauma   Patient had an IO placed in the L tibia 2 weeks ago and has had pain since.  No redness of the skin.  No drainage.  No f/c/r.      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, uncontrolled 06/20/2012   Seizure disorder ([place]) 06/20/2012   Unspecified hypothyroidism 06/20/2012   Hyponatremia 06/20/2012    Past Surgical History:  Procedure Laterality Date   ANKLE SURGERY Bilateral 2009   TUBAL LIGATION  1999     OB History     Gravida  4   Para  3   Term  3   Preterm  0   AB  1   Living  3      SAB  1   IAB  0   Ectopic  0    Multiple  0   Live Births  3           Family History  Problem Relation Age of Onset   Diabetes Father    Cancer Maternal Grandfather     Social History   Tobacco Use   Smoking status: Every Day    Packs/day: 0.25    Types: Cigarettes   Smokeless tobacco: Never  Vaping Use   Vaping Use: Never used  Substance Use Topics   Alcohol use: No   Drug use: No    Home Medications Prior to Admission medications   Medication Sig Start Date End Date Taking? Authorizing Provider  aspirin 81 MG EC tablet Take by mouth. 04/10/16   [provider]  DULoxetine (CYMBALTA) 60 MG capsule Take 60 mg by mouth daily.    [provider]  gabapentin (NEURONTIN) 600 MG tablet Take 1 tablet (600 mg total) by mouth 2 (two) times daily. 01/10/15   Katheren Shams, DO  insulin aspart (NOVOLOG) 100 UNIT/ML injection Inject into the skin 3 (three) times daily before meals.    [provider]  insulin glargine (LANTUS) 100 UNIT/ML injection Inject 15 Units into the skin daily. 06/22/12   Regalado, Belkys A, MD  levothyroxine (SYNTHROID, LEVOTHROID) 125 MCG tablet Take 125 mcg by mouth daily.      [provider]  lisinopril (PRINIVIL,ZESTRIL) 5 MG tablet Take 5 mg by mouth daily.    [provider]  phenytoin (DILANTIN) 100 MG ER capsule Take 200 mg by mouth 2 (two) times daily.     [provider]  simvastatin (ZOCOR) 20 MG tablet Take 20 mg by mouth every evening.    [provider]    Allergies    Contrast media [iodinated diagnostic agents] and Iohexol  Review of Systems   Review of Systems  Constitutional:  Negative for fatigue and fever.  HENT:  Negative for congestion.   Eyes:  Negative for redness.  Respiratory:  Negative for wheezing and stridor.   Cardiovascular:  Negative for chest pain.  Gastrointestinal:  Negative for vomiting.  Genitourinary:  Negative for difficulty urinating.  Musculoskeletal:  Positive for  arthralgias. Negative for back pain, neck pain and stiffness.  Skin:  Negative for color change, itching and rash.  Neurological:  Negative for dizziness.  Psychiatric/Behavioral:  Negative for agitation.   All other systems reviewed and are negative.  Physical Exam Updated Vital Signs BP 115/70 (BP Location: Right Arm)   Pulse 78   Temp 98.2 F (36.8 C) (Oral)   Resp 16   Ht 5\' 5"  (1.651 m)   Wt 77.6 kg   SpO2 100%   BMI 28.46 kg/m   Physical Exam Vitals and nursing note reviewed.  Constitutional:      General: She is not in acute distress.    Appearance: Normal appearance.  HENT:     Head: Normocephalic and atraumatic.     Nose: Nose normal.  Eyes:     Conjunctiva/sclera: Conjunctivae normal.     Pupils: Pupils are equal, round, and reactive to light.  Cardiovascular:     Rate and Rhythm: Normal rate and regular rhythm.     Pulses: Normal pulses.     Heart sounds: Normal heart sounds.  Pulmonary:     Effort: Pulmonary effort is normal.     Breath sounds: Normal breath sounds.  Abdominal:     General: Abdomen is flat. Bowel sounds are normal.     Palpations: Abdomen is soft.     Tenderness: There is no abdominal tenderness. There is no guarding.  Musculoskeletal:        General: Normal range of motion.     Cervical back: Normal range of motion and neck supple.     Left hip: Normal.     Left upper leg: Normal.     Left knee: Normal. No LCL laxity, MCL laxity, ACL laxity or PCL laxity.Normal meniscus and normal patellar mobility. Normal pulse.     Instability Tests: Anterior drawer test negative. Posterior drawer test negative.     Left lower leg: Normal. No swelling, deformity or lacerations. No edema.     Left ankle: Normal.     Left Achilles Tendon: Normal.     Left foot: Normal.  Skin:    General: Skin is warm and dry.     Capillary Refill: Capillary refill takes less than 2 seconds.     Findings: No erythema.  Neurological:     General: No focal deficit  present.     Mental Status: She is alert and oriented to person, place, and  time.     Sensory: No sensory deficit.     Deep Tendon Reflexes: Reflexes normal.  Psychiatric:        Mood and Affect: Mood normal.        Behavior: Behavior normal.    ED Results / Procedures / Treatments   Labs (all labs ordered are listed, but only abnormal results are displayed) Labs Reviewed - No data to display  EKG None  Radiology DG Tibia/Fibula Left  Result Date: 12/31/2020 CLINICAL DATA:  I 0 placed.  Left leg pain. EXAM: LEFT TIBIA AND FIBULA - 2 VIEW COMPARISON:  None. FINDINGS: Rounded lucency in the proximal tibial metaphysis likely site of prior IO. No associated erosive change or periosteal reaction. No fracture. Surgical change in the medial and lateral malleoli. Knee and ankle alignment are maintained. Mild generalized soft tissue edema. IMPRESSION: Rounded lucency in the proximal tibial metaphysis is likely site of prior IO. No associated erosive change or periosteal reaction. Mild generalized soft tissue edema. Electronically Signed   By: Keith Rake M.D.   On: 12/31/2020 23:57   US Venous Img Lower Unilateral Left  Result Date: 12/31/2020 CLINICAL DATA:  Left leg pain. EXAM: LEFT LOWER EXTREMITY VENOUS DOPPLER ULTRASOUND TECHNIQUE: Gray-scale sonography with compression, as well as color and duplex ultrasound, were performed to evaluate the deep venous system(s) from the level of the common femoral vein through the popliteal and proximal calf veins. COMPARISON:  None. FINDINGS: VENOUS Normal compressibility of the common femoral, superficial femoral, and popliteal veins, as well as the visualized calf veins. Visualized portions of profunda femoral vein and great saphenous vein unremarkable. No filling defects to suggest DVT on grayscale or color Doppler imaging. Doppler waveforms show normal direction of venous flow, normal respiratory plasticity and response to augmentation. Limited views  of the contralateral common femoral vein are unremarkable. OTHER None. Limitations: none IMPRESSION: Negative. Electronically Signed   By: Virgina Norfolk M.D.   On: 12/31/2020 21:56    Procedures Procedures   Medications Ordered in ED Medications - No data to display  ED Course  I have reviewed the triage vital signs and the nursing notes.  Pertinent labs & imaging results that were available during my care of the patient were reviewed by me and considered in my medical decision making (see chart for details).   Give patient is a diabetic and had instrumentation, will cover with doxycyline and NSAIDs.  Follow up in 3 days with PMD.  Strict return precautions given for fevers, redness, swelling or any concerns  SHELBE HAGLUND was evaluated in Emergency Department on 01/01/2021 for the symptoms described in the history of present illness. She was evaluated in the context of the global COVID-19 pandemic, which necessitated consideration that the patient might be at risk for infection with the SARS-CoV-2 virus that causes COVID-19. Institutional protocols and algorithms that pertain to the evaluation of patients at risk for COVID-19 are in a state of rapid change based on information released by regulatory bodies including the CDC and federal and state organizations. These policies and algorithms were followed during the patient's care in the ED.     Final Clinical Impression(s) / ED Diagnoses Final diagnoses:  None   Return for intractable cough, coughing up blood, fevers > 100.4 unrelieved by medication, shortness of breath, intractable vomiting, chest pain, shortness of breath, weakness, numbness, changes in speech, facial asymmetry, abdominal pain, passing out, Inability to tolerate liquids or food, cough, altered mental status or any concerns.  No signs of systemic illness or infection. The patient is nontoxic-appearing on exam and vital signs are within normal limits. I have reviewed  the triage vital signs and the nursing notes. Pertinent labs & imaging results that were available during my care of the patient were reviewed by me and considered in my medical decision making (see chart for details). After history, exam, and medical workup I feel the patient has been appropriately medically screened and is safe for discharge home. Pertinent diagnoses were discussed with the patient. Patient was given return precautions.   Rx / DC Orders ED Discharge Orders     None        Nivan Melendrez, MD 01/01/21 8887

## 2021-03-05 ENCOUNTER — Encounter (HOSPITAL_BASED_OUTPATIENT_CLINIC_OR_DEPARTMENT_OTHER): Payer: Self-pay | Admitting: Emergency Medicine

## 2021-03-05 ENCOUNTER — Emergency Department (HOSPITAL_BASED_OUTPATIENT_CLINIC_OR_DEPARTMENT_OTHER): Payer: Self-pay

## 2021-03-05 ENCOUNTER — Other Ambulatory Visit: Payer: Self-pay

## 2021-03-05 ENCOUNTER — Emergency Department (HOSPITAL_BASED_OUTPATIENT_CLINIC_OR_DEPARTMENT_OTHER)
Admission: EM | Admit: 2021-03-05 | Discharge: 2021-03-05 | Disposition: A | Discharge status: Home / Self Care | Payer: Self-pay | Attending: Emergency Medicine | Admitting: Emergency Medicine

## 2021-03-05 DIAGNOSIS — E039 Hypothyroidism, unspecified: Secondary | ICD-10-CM | POA: Insufficient documentation

## 2021-03-05 DIAGNOSIS — Z79899 Other long term (current) drug therapy: Secondary | ICD-10-CM | POA: Insufficient documentation

## 2021-03-05 DIAGNOSIS — Z20822 Contact with and (suspected) exposure to covid-19: Secondary | ICD-10-CM | POA: Insufficient documentation

## 2021-03-05 DIAGNOSIS — R519 Headache, unspecified: Secondary | ICD-10-CM | POA: Insufficient documentation

## 2021-03-05 DIAGNOSIS — E1065 Type 1 diabetes mellitus with hyperglycemia: Secondary | ICD-10-CM | POA: Insufficient documentation

## 2021-03-05 DIAGNOSIS — R739 Hyperglycemia, unspecified: Secondary | ICD-10-CM

## 2021-03-05 DIAGNOSIS — E1042 Type 1 diabetes mellitus with diabetic polyneuropathy: Secondary | ICD-10-CM | POA: Insufficient documentation

## 2021-03-05 DIAGNOSIS — Z7982 Long term (current) use of aspirin: Secondary | ICD-10-CM | POA: Insufficient documentation

## 2021-03-05 DIAGNOSIS — F17210 Nicotine dependence, cigarettes, uncomplicated: Secondary | ICD-10-CM | POA: Insufficient documentation

## 2021-03-05 DIAGNOSIS — Z794 Long term (current) use of insulin: Secondary | ICD-10-CM | POA: Insufficient documentation

## 2021-03-05 LAB — CBC WITH DIFFERENTIAL/PLATELET
Abs Immature Granulocytes: 0.01 10*3/uL (ref 0.00–0.07)
Basophils Absolute: 0 10*3/uL (ref 0.0–0.1)
Basophils Relative: 0 %
Eosinophils Absolute: 0 10*3/uL (ref 0.0–0.5)
Eosinophils Relative: 0 %
HCT: 35.4 % — ABNORMAL LOW (ref 36.0–46.0)
Hemoglobin: 11.9 g/dL — ABNORMAL LOW (ref 12.0–15.0)
Immature Granulocytes: 0 %
Lymphocytes Relative: 22 %
Lymphs Abs: 1.4 10*3/uL (ref 0.7–4.0)
MCH: 29.6 pg (ref 26.0–34.0)
MCHC: 33.6 g/dL (ref 30.0–36.0)
MCV: 88.1 fL (ref 80.0–100.0)
Monocytes Absolute: 0.3 10*3/uL (ref 0.1–1.0)
Monocytes Relative: 4 %
Neutro Abs: 4.7 10*3/uL (ref 1.7–7.7)
Neutrophils Relative %: 74 %
Platelets: 352 10*3/uL (ref 150–400)
RBC: 4.02 MIL/uL (ref 3.87–5.11)
RDW: 15.4 % (ref 11.5–15.5)
WBC: 6.4 10*3/uL (ref 4.0–10.5)
nRBC: 0 % (ref 0.0–0.2)

## 2021-03-05 LAB — COMPREHENSIVE METABOLIC PANEL
ALT: 17 U/L (ref 0–44)
AST: 20 U/L (ref 15–41)
Albumin: 3.4 g/dL — ABNORMAL LOW (ref 3.5–5.0)
Alkaline Phosphatase: 137 U/L — ABNORMAL HIGH (ref 38–126)
Anion gap: 12 (ref 5–15)
BUN: 19 mg/dL (ref 6–20)
CO2: 21 mmol/L — ABNORMAL LOW (ref 22–32)
Calcium: 8.6 mg/dL — ABNORMAL LOW (ref 8.9–10.3)
Chloride: 94 mmol/L — ABNORMAL LOW (ref 98–111)
Creatinine, Ser: 0.64 mg/dL (ref 0.44–1.00)
GFR, Estimated: >60 mL/min (ref >60)
Glucose, Bld: 471 mg/dL — ABNORMAL HIGH (ref 70–99)
Potassium: 5.4 mmol/L — ABNORMAL HIGH (ref 3.5–5.1)
Sodium: 127 mmol/L — ABNORMAL LOW (ref 135–145)
Total Bilirubin: 0.6 mg/dL (ref 0.3–1.2)
Total Protein: 7.4 g/dL (ref 6.5–8.1)

## 2021-03-05 LAB — URINALYSIS, ROUTINE W REFLEX MICROSCOPIC
Bilirubin Urine: NEGATIVE
Glucose, UA: >=500 mg/dL — AB
Ketones, ur: 40 mg/dL — AB
Leukocytes,Ua: NEGATIVE
Nitrite: NEGATIVE
Protein, ur: NEGATIVE mg/dL
Specific Gravity, Urine: 1.01 (ref 1.005–1.030)
pH: 6 (ref 5.0–8.0)

## 2021-03-05 LAB — I-STAT VENOUS BLOOD GAS, ED
Acid-Base Excess: 0 mmol/L (ref 0.0–2.0)
Bicarbonate: 22.9 mmol/L (ref 20.0–28.0)
Calcium, Ion: 0.97 mmol/L — ABNORMAL LOW (ref 1.15–1.40)
HCT: 38 % (ref 36.0–46.0)
Hemoglobin: 12.9 g/dL (ref 12.0–15.0)
O2 Saturation: 93 %
Potassium: 4.9 mmol/L (ref 3.5–5.1)
Sodium: 130 mmol/L — ABNORMAL LOW (ref 135–145)
TCO2: 24 mmol/L (ref 22–32)
pCO2, Ven: 30.3 mmHg — ABNORMAL LOW (ref 44.0–60.0)
pH, Ven: 7.485 — ABNORMAL HIGH (ref 7.250–7.430)
pO2, Ven: 59 mmHg — ABNORMAL HIGH (ref 32.0–45.0)

## 2021-03-05 LAB — CBG MONITORING, ED
Glucose-Capillary: 433 mg/dL — ABNORMAL HIGH (ref 70–99)
Glucose-Capillary: 479 mg/dL — ABNORMAL HIGH (ref 70–99)
Glucose-Capillary: 373 mg/dL — ABNORMAL HIGH (ref 70–99)

## 2021-03-05 LAB — TROPONIN I (HIGH SENSITIVITY)
Troponin I (High Sensitivity): <2 ng/L (ref <18)
Troponin I (High Sensitivity): <2 ng/L (ref <18)

## 2021-03-05 LAB — RESP PANEL BY RT-PCR (FLU A&B, COVID) ARPGX2
Influenza A by PCR: NEGATIVE
Influenza B by PCR: NEGATIVE
SARS Coronavirus 2 by RT PCR: NEGATIVE

## 2021-03-05 LAB — PREGNANCY, URINE: Preg Test, Ur: NEGATIVE

## 2021-03-05 LAB — URINALYSIS, MICROSCOPIC (REFLEX): WBC, UA: NONE SEEN WBC/hpf (ref 0–5)

## 2021-03-05 LAB — POTASSIUM: Potassium: 4.1 mmol/L (ref 3.5–5.1)

## 2021-03-05 IMAGING — CT CT HEAD W/O CM
3 series · 15 of 47 positions shown, 18 images · non-contrast
Comparison: CT head [DATE].

CLINICAL DATA: Headaches with nausea and vomiting.

EXAM:
CT HEAD WITHOUT CONTRAST
TECHNIQUE: Contiguous axial images were obtained from the base of the skull
through the vertex without intravenous contrast.

[Series 2: head wo · axial · 0.41mm/px · z∈[-172,-42]mm · 9 of 32 slices shown, 12 images]
[im 3/32  brain]
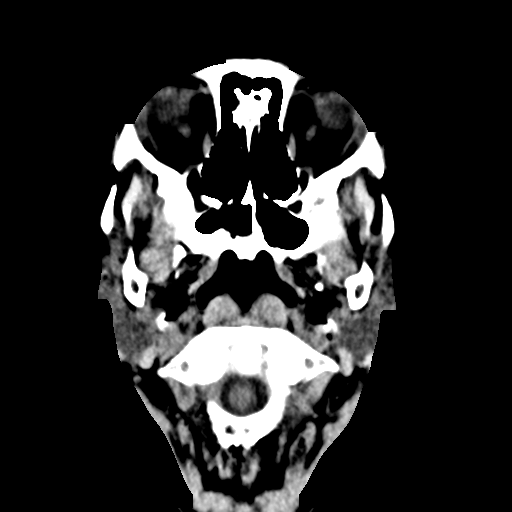
[im 3/32  bone]
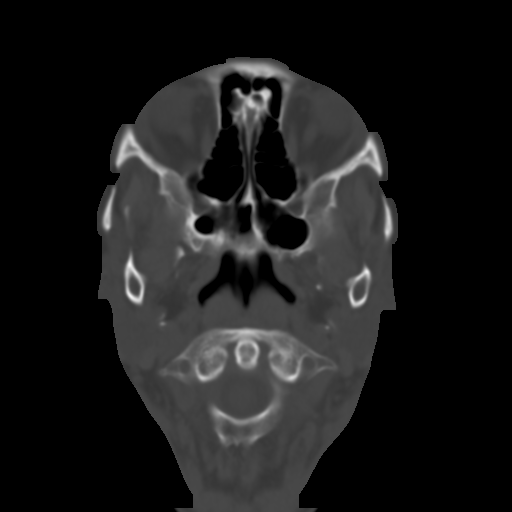
[im 6/32  brain]
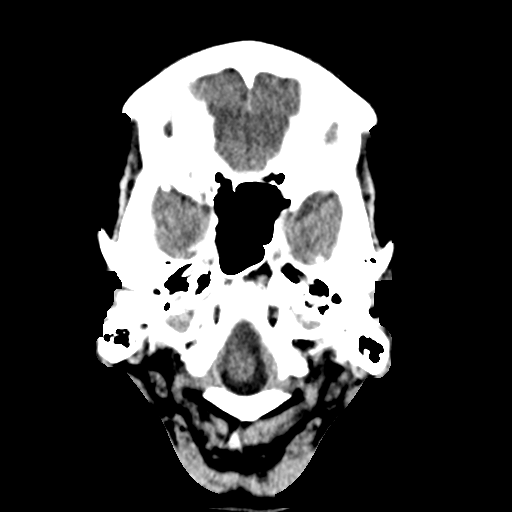
[im 9/32  brain]
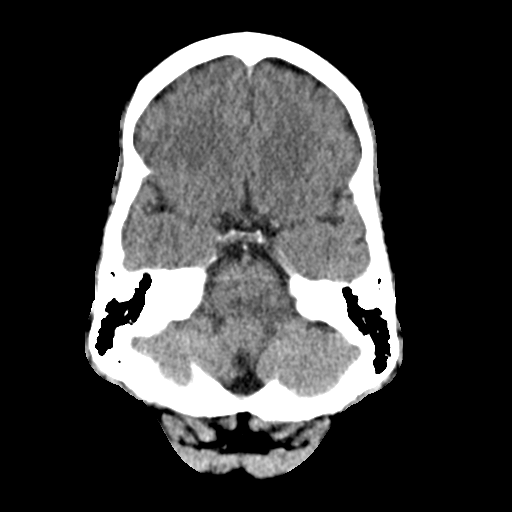
[im 12/32  brain]
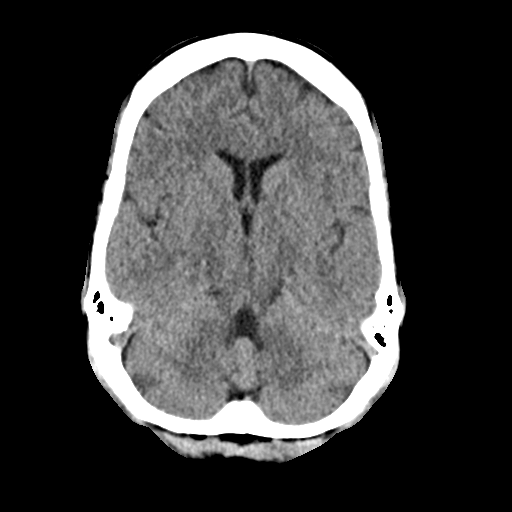
[im 17/32  brain]
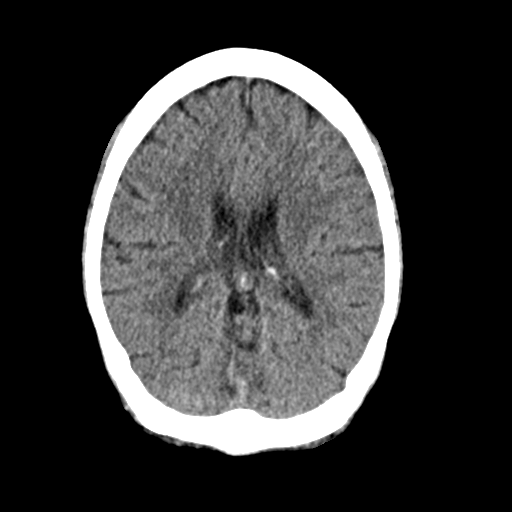
[im 17/32  bone]
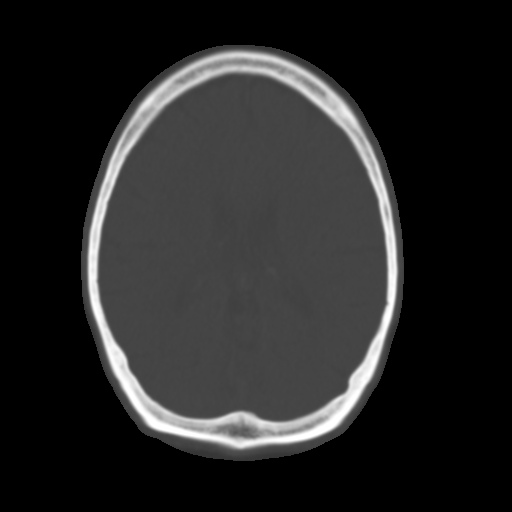
[im 20/32  brain]
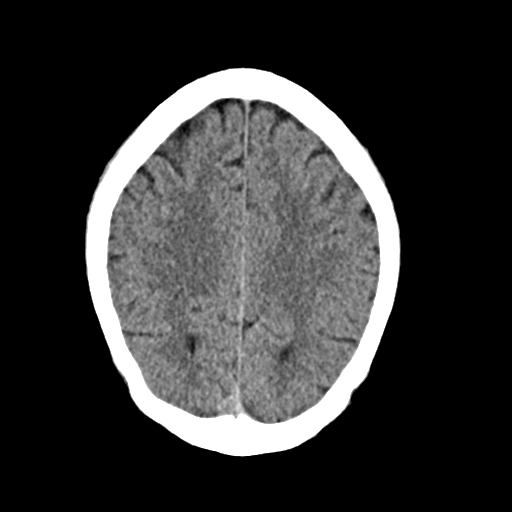
[im 23/32  brain]
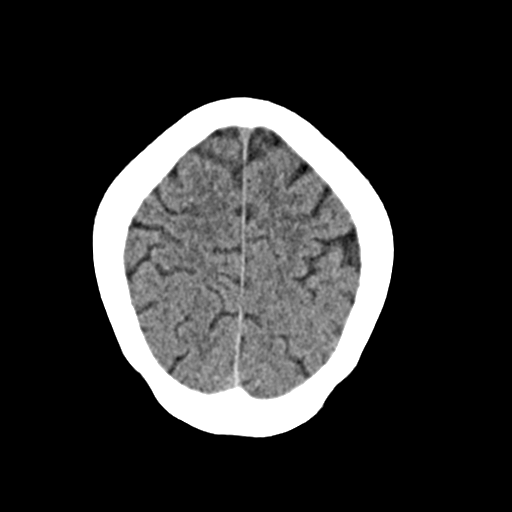
[im 26/32  brain]
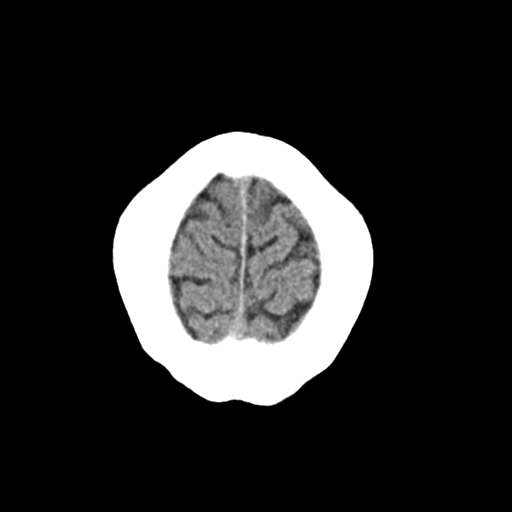
[im 29/32  brain]
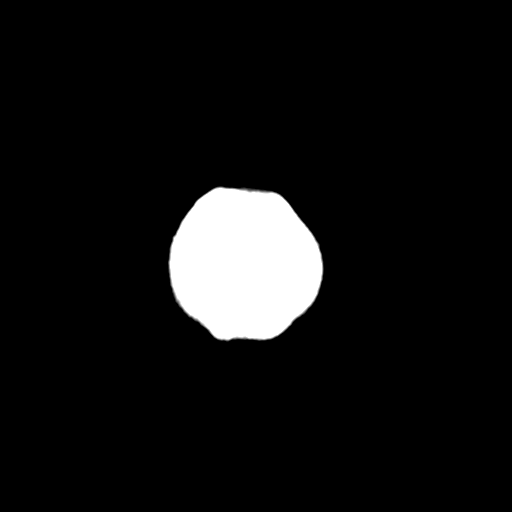
[im 29/32  bone]
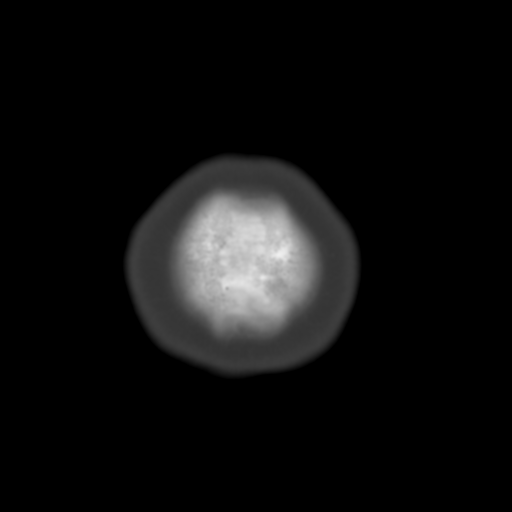

[Series 4: coronal soft · coronal · 0.29mm/px · 3 of 65 slices shown]
[im 22/65  brain]
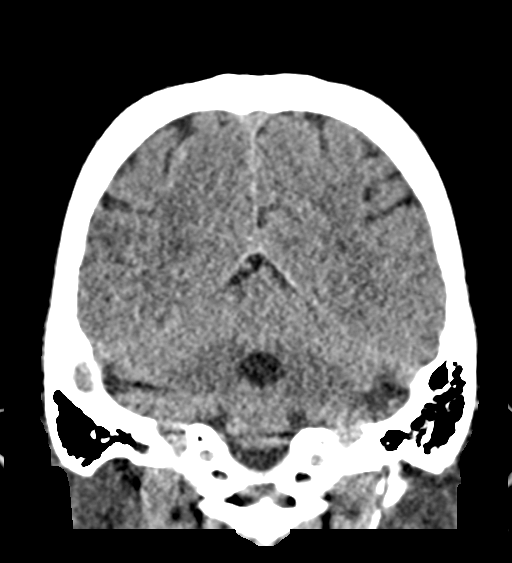
[im 29/65  brain]
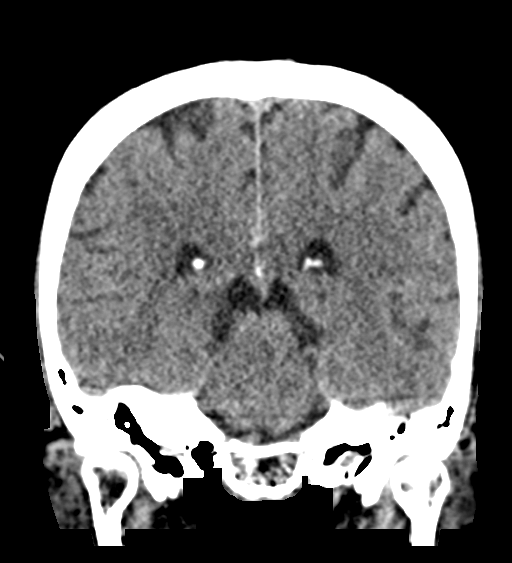
[im 36/65  brain]
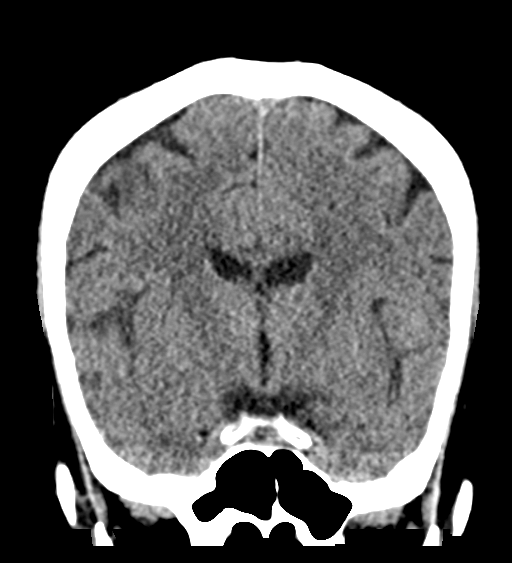

[Series 5: sag soft · sagittal · 0.33mm/px · 3 of 50 slices shown]
[im 17/50  brain]
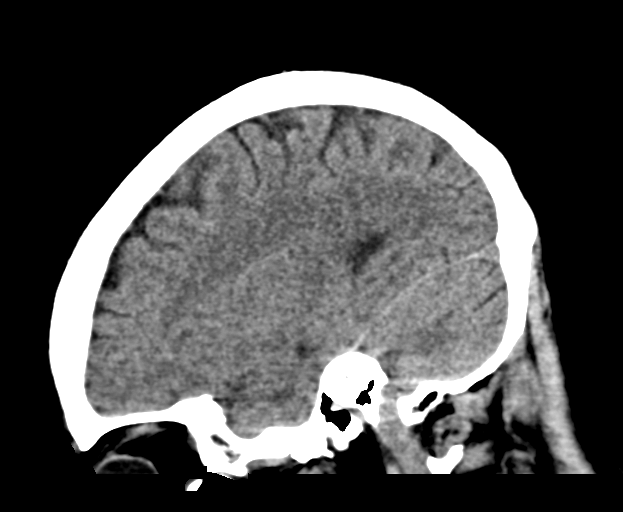
[im 25/50  brain]
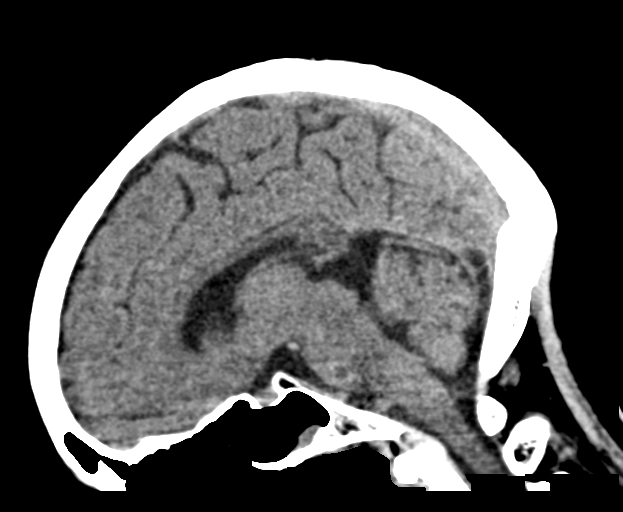
[im 33/50  brain]
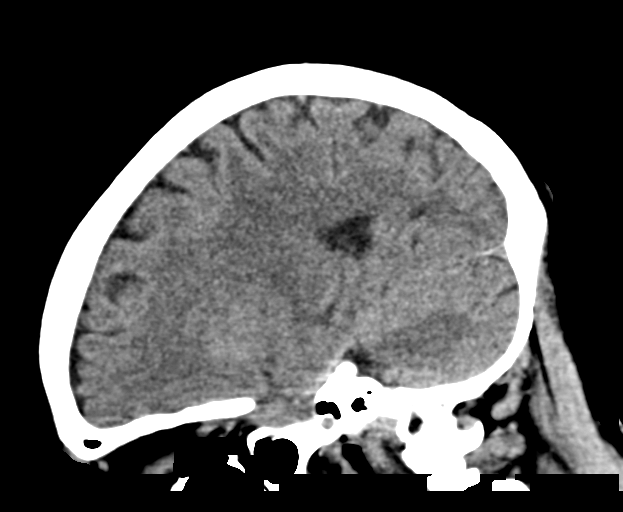

[15 of 47 positions shown; findings below may reference images not displayed]

FINDINGS: Brain: There is no evidence of acute intracranial hemorrhage, mass
lesion, brain edema or extra-axial fluid collection. The ventricles
and subarachnoid spaces are appropriately sized for age. There is no
CT evidence of acute cortical infarction.

Vascular:  No hyperdense vessel identified.

Skull: Negative for fracture or focal lesion.

Sinuses/Orbits: New mild dependent opacity in the right division of
the sphenoid sinus without definite air-fluid levels. The remainder
of the visualized paranasal sinuses, mastoid air cells and middle
ears are clear. No significant orbital findings.

Other: None.
IMPRESSION: New mild dependent opacity in the right division of the sphenoid
sinus. Otherwise stable head CT without acute intracranial findings.

## 2021-03-05 MED ORDER — KETOROLAC TROMETHAMINE 15 MG/ML IJ SOLN: 15.0000 mg | Freq: Once | INTRAMUSCULAR | Status: DC

## 2021-03-05 MED ORDER — DIPHENHYDRAMINE HCL 25 MG PO CAPS
25.0000 mg | ORAL_CAPSULE | Freq: Once | ORAL | Status: AC
  Administered 2021-03-05: 25 mg via ORAL
  Filled 2021-03-05: qty 1

## 2021-03-05 MED ORDER — INSULIN ASPART 100 UNIT/ML IJ SOLN: 6.0000 [IU] | Freq: Once | INTRAMUSCULAR | Status: DC

## 2021-03-05 MED ORDER — AMOXICILLIN-POT CLAVULANATE 875-125 MG PO TABS: 1.0000 | ORAL_TABLET | Freq: Two times a day (BID) | ORAL | 0 refills | Status: DC

## 2021-03-05 MED ORDER — INSULIN REGULAR HUMAN 100 UNIT/ML IJ SOLN: 6.0000 [IU] | Freq: Once | INTRAMUSCULAR | Status: DC

## 2021-03-05 MED ORDER — PROCHLORPERAZINE EDISYLATE 10 MG/2ML IJ SOLN
10.0000 mg | Freq: Once | INTRAMUSCULAR | Status: DC
  Filled 2021-03-05: qty 2

## 2021-03-05 MED ORDER — SODIUM CHLORIDE 0.9 % IV BOLUS
1000.0000 mL | Freq: Once | INTRAVENOUS | Status: AC
  Administered 2021-03-05: 1000 mL via INTRAVENOUS

## 2021-03-05 MED ORDER — INSULIN ASPART 100 UNIT/ML IJ SOLN
6.0000 [IU] | Freq: Once | INTRAMUSCULAR | Status: AC
  Administered 2021-03-05: 6 [IU] via INTRAVENOUS

## 2021-03-05 MED ORDER — SODIUM CHLORIDE 0.9 % IV BOLUS
500.0000 mL | Freq: Once | INTRAVENOUS | Status: AC
  Administered 2021-03-05: 500 mL via INTRAVENOUS

## 2021-03-05 NOTE — ED Notes (Signed)
Patient transported to CT 

## 2021-03-05 NOTE — ED Notes (Signed)
Headache improved after iv fluids and medications

## 2021-03-05 NOTE — Discharge Instructions (Signed)
Your work-up is reassuring in the emergency department today.  Continue taking in fluids at home.  Continue to monitor your blood sugar.  If you have any worsening symptoms return to the ER

## 2021-03-05 NOTE — ED Triage Notes (Addendum)
Pt arrives via [facility] from home with c/o headache starting at midnight, did not take anything PTA. @ episodes of vomiting PTA of EMS, no vomiting with EMS reports history of migraines, denies having any medication for them at home. 144/82, HR 90, CBG 110, 100% RA. 98.4 Temp

## 2021-03-05 NOTE — ED Notes (Signed)
Unable to obtain PIV after multiple attempts, provider to attempt

## 2021-03-05 NOTE — ED Provider Notes (Signed)
[facility] Provider Note   CSN: [id number] Arrival date & time: 03/05/21  1052     History Chief Complaint  Patient presents with   Headache    Zoe Clay is a 45 y.o. female.  HPI 45 year old female past medical history significant for bipolar disorder, schizophrenia, hypertension, seizure disorder, diabetes who presents to the emergency department today for headache, nausea, vomiting, body aches, shortness of breath.  Patient reports that her symptoms again last night.  She reports that her headache worsened this morning.  Patient reports history of migraines but states this is the worst headache that she has had.  She reports associated nausea and vomiting this morning.  Patient also reports some shortness of the breath.  Patient denies chest pain.  No cough, sore throat, rhinorrhea, otalgia.  No diarrhea.  No abdominal pain.  No unilateral leg swelling or calf tenderness.  No history of DVT/PE, prolonged immobilization, recent hospitalization/surgeries, oral contraceptive use.  No cardiac history.  She is taken no medications for pain prior to arrival.  No known sick contacts.  Patient denies any photophobia, phonophobia, vision changes.  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, uncontrolled 06/20/2012   Seizure disorder ([place]) 06/20/2012   Unspecified hypothyroidism 06/20/2012   Hyponatremia 06/20/2012    Past Surgical History:  Procedure Laterality Date   ANKLE SURGERY Bilateral 2009   TUBAL LIGATION   1999     OB History     Gravida  4   Para  3   Term  3   Preterm  0   AB  1   Living  3      SAB  1   IAB  0   Ectopic  0   Multiple  0   Live Births  3           Family History  Problem Relation Age of Onset   Diabetes Father    Cancer Maternal Grandfather     Social History   Tobacco Use   Smoking status: Every Day    Packs/day: 0.25    Types: Cigarettes   Smokeless tobacco: Never  Vaping Use   Vaping Use: Never used  Substance Use Topics   Alcohol use: No   Drug use: No    Home Medications Prior to Admission medications   Medication Sig Start Date End Date Taking? Authorizing Provider  aspirin 81 MG EC tablet Take by mouth. 04/10/16   [provider]  Diclofenac Sodium CR 100 MG 24 hr tablet Take 1 tablet (100 mg total) by mouth daily. 01/01/21   Palumbo, April, MD  doxycycline (VIBRAMYCIN) 100 MG capsule Take 1 capsule (100 mg total) by mouth 2 (two) times daily. One po bid x 7 days 01/01/21   Palumbo, April, MD  DULoxetine (CYMBALTA) 60 MG capsule Take 60 mg by mouth daily.    [provider]  gabapentin (NEURONTIN) 600 MG tablet Take 1 tablet (600  mg total) by mouth 2 (two) times daily. 01/10/15   Katheren Shams, DO  insulin aspart (NOVOLOG) 100 UNIT/ML injection Inject into the skin 3 (three) times daily before meals.    [provider]  insulin glargine (LANTUS) 100 UNIT/ML injection Inject 15 Units into the skin daily. 06/22/12   Regalado, Belkys A, MD  levothyroxine (SYNTHROID, LEVOTHROID) 125 MCG tablet Take 125 mcg by mouth daily.      [provider]  lidocaine (LIDODERM) 5 % Place 1 patch onto the skin daily. Remove & Discard patch within 12 hours or as directed by MD 01/01/21   Randal Buba, April, MD  lisinopril (PRINIVIL,ZESTRIL) 5 MG tablet Take 5 mg by mouth daily.    [provider]  phenytoin (DILANTIN) 100 MG ER capsule Take 200 mg by mouth 2 (two) times daily.     [provider]   simvastatin (ZOCOR) 20 MG tablet Take 20 mg by mouth every evening.    [provider]    Allergies    Contrast media [iodinated diagnostic agents] and Iohexol  Review of Systems   Review of Systems  Constitutional:  Negative for chills and fever.  HENT:  Negative for congestion and sore throat.   Eyes:  Negative for photophobia, discharge and visual disturbance.  Respiratory:  Positive for shortness of breath. Negative for cough.   Cardiovascular:  Negative for chest pain, palpitations and leg swelling.  Gastrointestinal:  Positive for nausea and vomiting. Negative for abdominal pain and diarrhea.  Genitourinary:  Negative for difficulty urinating, dysuria, frequency and urgency.  Musculoskeletal:  Negative for myalgias.  Skin:  Negative for color change.  Neurological:  Positive for headaches. Negative for dizziness, syncope, weakness, light-headedness and numbness.  Psychiatric/Behavioral:  Negative for confusion.    Physical Exam Updated Vital Signs BP 133/84   Pulse 93   Temp 97.9 F (36.6 C) (Oral)   Resp 16   Ht 5\' 5"  (1.651 m)   Wt 79.4 kg   SpO2 100%   BMI 29.12 kg/m   Physical Exam Vitals and nursing note reviewed.  Constitutional:      General: She is not in acute distress.    Appearance: She is well-developed. She is not ill-appearing or toxic-appearing.  HENT:     Head: Normocephalic and atraumatic.     Mouth/Throat:     Mouth: Mucous membranes are moist.     Pharynx: Oropharynx is clear.  Eyes:     General: No scleral icterus.       Right eye: No discharge.        Left eye: No discharge.     Pupils: Pupils are equal, round, and reactive to light.  Cardiovascular:     Rate and Rhythm: Normal rate and regular rhythm.     Heart sounds: Normal heart sounds. No murmur heard.   No friction rub. No gallop.  Pulmonary:     Effort: Pulmonary effort is normal. No respiratory distress.     Breath sounds: Normal breath sounds.  Abdominal:      General: Bowel sounds are normal.     Palpations: Abdomen is soft.  Musculoskeletal:        General: No swelling or tenderness. Normal range of motion.     Cervical back: Normal range of motion and neck supple. No rigidity.  Skin:    General: Skin is warm and dry.     Capillary Refill: Capillary refill takes less than 2 seconds.     Coloration:  Skin is not pale.  Neurological:     Mental Status: She is alert.     Comments: The patient is alert, attentive, and oriented x 3. Speech is clear. Cranial nerve II-VII grossly intact. Negative pronator drift. Sensation intact. Strength 5/5 in all extremities. Reflexes 2+ and symmetric at biceps, triceps, knees, and ankles. Rapid alternating movement and fine finger movements intact.    Psychiatric:        Behavior: Behavior normal.        Thought Content: Thought content normal.        Judgment: Judgment normal.    ED Results / Procedures / Treatments   Labs (all labs ordered are listed, but only abnormal results are displayed) Labs Reviewed  RESP PANEL BY RT-PCR (FLU A&B, COVID) [id number]  CBC WITH DIFFERENTIAL/PLATELET  COMPREHENSIVE METABOLIC PANEL  URINALYSIS, ROUTINE W REFLEX MICROSCOPIC  PREGNANCY, URINE  TROPONIN I (HIGH SENSITIVITY)    EKG None  Radiology CT Head Wo Contrast  Result Date: 03/05/2021 CLINICAL DATA:  Headaches with nausea and vomiting. EXAM: CT HEAD WITHOUT CONTRAST TECHNIQUE: Contiguous axial images were obtained from the base of the skull through the vertex without intravenous contrast. COMPARISON:  CT head 03/03/2020. FINDINGS: Brain: There is no evidence of acute intracranial hemorrhage, mass lesion, brain edema or extra-axial fluid collection. The ventricles and subarachnoid spaces are appropriately sized for age. There is no CT evidence of acute cortical infarction. Vascular:  No hyperdense vessel identified. Skull: Negative for fracture or focal lesion. Sinuses/Orbits: New mild dependent opacity in the right  division of the sphenoid sinus without definite air-fluid levels. The remainder of the visualized paranasal sinuses, mastoid air cells and middle ears are clear. No significant orbital findings. Other: None. IMPRESSION: New mild dependent opacity in the right division of the sphenoid sinus. Otherwise stable head CT without acute intracranial findings. Electronically Signed   By: Richardean Sale M.D.   On: 03/05/2021 12:29   DG Chest Portable 1 View  Result Date: 03/05/2021 CLINICAL DATA:  Headache, shortness of breath and vomiting. EXAM: PORTABLE CHEST 1 VIEW COMPARISON:  11/04/2019 FINDINGS: The cardiac silhouette, mediastinal and hilar contours are within normal limits. The lungs are clear of an acute process. No pleural effusions or pulmonary lesions. The bony thorax is intact. IMPRESSION: No acute cardiopulmonary findings. Electronically Signed   By: Marijo Sanes M.D.   On: 03/05/2021 12:33    Procedures Procedures   Medications Ordered in ED Medications  sodium chloride 0.9 % bolus 500 mL (has no administration in time range)  prochlorperazine (COMPAZINE) injection 10 mg (has no administration in time range)  diphenhydrAMINE (BENADRYL) capsule 25 mg (has no administration in time range)    ED Course  I have reviewed the triage vital signs and the nursing notes.  Pertinent labs & imaging results that were available during my care of the patient were reviewed by me and considered in my medical decision making (see chart for details).    MDM Rules/Calculators/A&P                           Patient presents for headache, nausea, vomiting, shortness of breath and body aches.  Patient does have history of diabetes.  Patient's blood sugar elevated in the ER.  Patient has no evidence of DKA.  Patient has no significant evidence of electrolyte abnormality.  Corrected sodium is normal.  Patient's blood sugar improved after IV insulin and fluids in the ER.  COVID and flu test were negative.  UA  showed no concern for infection.  Chest x-ray shows no evidence of focal infiltrate concerning for pneumonia.  CT imaging of head shows possible sinusitis in the right sphenoid sinus.  Patient denies any nasal congestion but does report a headache.  Will treat with antibiotics for this.  Presentation does not seem consistent with sinus venous thrombosis at this time.  Patient feels significantly improved after migraine cocktail in the ER along with fluids and improvement of her blood sugar.  Patient tolerating p.o. fluids.  Patient encouraged to continue symptomatic management at home and discussed reasons to return to the ER.  Final Clinical Impression(s) / ED Diagnoses Final diagnoses:  Hyperglycemia  Acute nonintractable headache, unspecified headache type    Rx / DC Orders ED Discharge Orders          Ordered    amoxicillin-clavulanate (AUGMENTIN) 875-125 MG tablet  Every 12 hours        03/05/21 1926             Aaron Edelman 03/05/21 1930    Varney Biles, MD 03/06/21 1204

## 2021-03-05 NOTE — ED Notes (Signed)
Attempt PIV unsuccessful, other staff to attempt

## 2021-03-05 NOTE — ED Notes (Signed)
Provider unable to obtain PIV with u/s, attending to attempt

## 2021-03-22 ENCOUNTER — Other Ambulatory Visit: Payer: Self-pay | Admitting: Interventional Radiology

## 2021-03-22 ENCOUNTER — Other Ambulatory Visit: Payer: Self-pay | Admitting: Foot & Ankle Surgery

## 2021-03-22 DIAGNOSIS — S91301A Unspecified open wound, right foot, initial encounter: Secondary | ICD-10-CM

## 2021-03-27 ENCOUNTER — Encounter: Payer: Self-pay | Admitting: *Deleted

## 2021-03-27 ENCOUNTER — Other Ambulatory Visit: Payer: Self-pay

## 2021-03-27 ENCOUNTER — Ambulatory Visit
Admission: RE | Admit: 2021-03-27 | Discharge: 2021-03-27 | Disposition: A | Discharge status: Home / Self Care | Payer: Self-pay | Source: Ambulatory Visit | Attending: Foot & Ankle Surgery | Admitting: Foot & Ankle Surgery

## 2021-03-27 DIAGNOSIS — S91301A Unspecified open wound, right foot, initial encounter: Secondary | ICD-10-CM

## 2021-03-27 HISTORY — PX: IR RADIOLOGIST EVAL & MGMT: IMG5224

## 2021-03-27 NOTE — Consult Note (Signed)
Chief Complaint: Right great toe wound  Referring Physician(s): [facility] [address] [place],  [address] [phone]  PCP: [facility] Dr. Windy Carina  History of Present Illness: Zoe Clay is a 45 y.o. female presenting to the [facility] today, kindly referred by Dr. Melony Overly of [facility], for evaluation of diabetic foot wound and abnormal lower extremity duplex exam.   Zoe Clay joins [place] today by virtual visit, and we confirmed her identity with 2 personal identifiers.    She tells me that for just over a month she has had a wound on the right great toe.  She says this started as a blister, and then ruptured and has not healed.  Until she went to see Dr. Melony Overly, she was using conservative wound care measures.  She tells me that she was treated with a course of anti-biotics.   She denies ever having a prior wound on either the left or right foot.    I cannot elicit a history of claudication or rest pain.    She is a long-standing DM type 1 patient.  She tells me she quit smoking 2 weeks ago.  She was smoking since age 86, about 1ppd.    CV risk factors: DM 1, Smoking, HLD, HTN  She denies any history of MI.  She denies any major surgeries.  She has had 1 surgery of her ankle.    She has documented iodinated contrast allergy.   She is currently taking an ACE, as well as 81mg  ASA.   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 Authorizing Provider  amoxicillin-clavulanate (AUGMENTIN) 875-125 MG tablet Take 1 tablet by mouth every 12 (twelve) hours. 03/05/21   Doristine Devoid, PA-C  aspirin 81 MG EC tablet Take by mouth. 04/10/16   [provider]  Diclofenac Sodium CR 100 MG 24 hr tablet Take 1 tablet (100 mg total) by mouth daily. 01/01/21   Palumbo, April, MD  DULoxetine (CYMBALTA) 60 MG capsule Take 60 mg by mouth daily.    [provider]  gabapentin (NEURONTIN) 600 MG tablet Take 1 tablet (600 mg total) by mouth 2 (two) times daily. 01/10/15   Katheren Shams, DO  insulin aspart (NOVOLOG) 100 UNIT/ML injection Inject into the skin 3 (three) times daily before meals.    [provider]  insulin glargine (LANTUS) 100 UNIT/ML injection Inject 15 Units into the skin daily. 06/22/12   Regalado, Belkys A, MD  levothyroxine (SYNTHROID, LEVOTHROID) 125 MCG tablet Take 125 mcg by mouth daily.      [provider]  lidocaine (LIDODERM) 5 % Place 1 patch onto the skin daily. Remove & Discard patch within 12 hours or as directed by MD 01/01/21   Randal Buba, April, MD  lisinopril (PRINIVIL,ZESTRIL) 5 MG tablet Take 5 mg by mouth daily.    [provider]  phenytoin (DILANTIN) 100 MG ER capsule Take 200 mg by mouth 2 (two) times daily.     [provider]  simvastatin (ZOCOR) 20 MG tablet Take 20 mg by mouth every evening.    [provider]     Family History  Problem Relation Age of Onset   Diabetes Father    Cancer Maternal Grandfather     Social History   Socioeconomic History   Marital status: Single    Spouse name: Not on file   Number of children: Not on file   Years of education: Not on file   Highest education level: Not on file  Occupational History   Not on file  Tobacco Use   Smoking status: Every Day    Packs/day: 0.25    Types: Cigarettes   Smokeless tobacco: Never  Vaping Use   Vaping Use:  Never used  Substance and Sexual Activity   Alcohol use: No   Drug use: No   Sexual activity: Not on file  Other Topics Concern   Not on file  Social History Narrative   Not on file   Social Determinants of Health   Financial Resource Strain: Not on file  Food Insecurity: Not on file  Transportation Needs: Not on file  Physical Activity: Not on file  Stress: Not on file  Social Connections: Not on file       Review of Systems  Review of Systems: A 12 point ROS discussed and pertinent positives are indicated in the HPI above.  All other systems are negative.  Physical Exam No direct physical exam was performed (except for noted visual exam findings with Video Visits).   Vital Signs: There were no vitals taken for this visit.  Imaging: CT Head Wo Contrast  Result Date: 03/05/2021 CLINICAL DATA:  Headaches with nausea and vomiting. EXAM: CT HEAD WITHOUT CONTRAST TECHNIQUE: Contiguous axial images were obtained from the base of the skull through the vertex without intravenous contrast. COMPARISON:  CT head 03/03/2020. FINDINGS: Brain: There is no evidence of acute intracranial hemorrhage, mass lesion, brain edema or extra-axial fluid collection. The ventricles and subarachnoid spaces are appropriately sized for age. There is no CT evidence of acute cortical infarction. Vascular:  No hyperdense vessel identified. Skull: Negative for fracture or focal lesion. Sinuses/Orbits: New mild dependent opacity in the right division of the sphenoid sinus without definite air-fluid levels. The remainder of the visualized paranasal sinuses, mastoid air cells and middle ears are clear. No significant orbital findings. Other: None. IMPRESSION: New mild dependent opacity in the right division of the sphenoid sinus. Otherwise stable head CT without acute intracranial findings. Electronically Signed   By: Richardean Sale M.D.   On: 03/05/2021 12:29   DG Chest Portable 1 View  Result Date:  03/05/2021 CLINICAL DATA:  Headache, shortness of breath and vomiting. EXAM: PORTABLE CHEST 1 VIEW COMPARISON:  11/04/2019 FINDINGS: The cardiac silhouette, mediastinal and hilar contours are within normal limits. The lungs are clear of an acute process. No pleural effusions or pulmonary lesions. The bony thorax is intact. IMPRESSION: No acute cardiopulmonary findings. Electronically Signed   By: Marijo Sanes M.D.   On: 03/05/2021 12:33    Labs:  CBC: Recent Labs    04/24/20 0533 03/05/21 1434 03/05/21 1454  WBC 4.8 6.4  --   HGB 12.3 11.9* 12.9  HCT 37.8 35.4* 38.0  PLT 309 352  --     COAGS: No results for input(s): INR, APTT  in the last 8760 hours.  BMP: Recent Labs    04/24/20 0533 03/05/21 1434 03/05/21 1454 03/05/21 1700  NA 134* 127* 130*  --   K 5.1 5.4* 4.9 4.1  CL 100 94*  --   --   CO2 19* 21*  --   --   GLUCOSE 593* 471*  --   --   BUN 31* 19  --   --   CALCIUM 8.9 8.6*  --   --   CREATININE 1.11* 0.64  --   --   GFRNONAA >60 >60  --   --     LIVER FUNCTION TESTS: Recent Labs    03/05/21 1434  BILITOT 0.6  AST 20  ALT 17  ALKPHOS 137*  PROT 7.4  ALBUMIN 3.4*    TUMOR MARKERS: No results for input(s): AFPTM, CEA, CA199, CHROMGRNA in the last 8760 hours.  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   Non-invasive lower extremity exam on 03/21/21 shows evidence of bilateral tibial-pedal arterial disease.   I had a long discussion with Zoe Clay regarding anatomy, pathology/pathophysiology, natural history, and prognosis of PAD/CLI.  Informed consent regarding treatment strategies was performed which would possibly include medical management, surgical strategy, and/or endovascular options, with risk/benefit discussion.  The indications for treatment supported by updated guidelines1, 2 were  discussed.  In her case, I did go further to describe the well known "pathway to amputation" that we frequently observe in patients with DM and tibial-pedal disease in the setting of a diabetic foot wound.  I did advise her that the major amputation rate is ~50% in untreated patients on the same side within a year, and is equally high on the contralateral side in the next 2-3 years.   Given the presence of diabetes, healthy foot care was also discussed, in accord with multi-disciplinary, Class 1 recommendations.1,3 Current recommendations advocate daily foot inspection, good nail care, avoiding barefoot walking, properly fitted footwear, seeking care with problems, and consideration of initiating routine podiatric care with at least annual inspection3.     I discussed with her the role of angiogram and intervention, with the intention of identifying treatable tibial disease to improve blood flow.  Typically, this distribution of disease is not amenable to formal bypass in the form of fem-distal or pedal bypass.  Regarding endovascular options, specific risks discussed include: bleeding, infection, contrast reaction, renal injury/nephropathy, arterial injury/dissection, need for additional procedure/surgery, worsening symptoms/tissue including limb loss, cardiopulmonary collapse, death.    Regarding medical management, maximal medical therapy for reduction of risk factors is indicated as recommended by updated AHA guidelines1.  This includes anti-platelet medication, tight blood glucose control to a HbA1c < 7, tight blood pressure control, maximum-dose HMG-CoA reductase inhibitor, and smoking cessation.  Smoking cessation was addressed,  and I congratulated her on her early success of ~2weeks.    Annual flu vaccination is also recommended, with Class 1 recommendation1.   Please CC to Dr. Melony Overly of [facility]  Plan: - After our discussion, and our recommendation for angiogram and possible  intervention, she would like to treat conservatively at this point, with follow up with her PCP and podiatry team.  - We will plan on 3 month office follow up to make sure she has adequate wound healing.  - If after speaking with her daughter and her  PCP and Podiatry team she would like to proceed with angiogram, we are happy to see her sooner for treatment of the right leg.  -Recommend continuing maximal medical therapy for cardiovascular risk reduction, including anti-platelet therapy. -Recommend continuing smoking cessation measures,  -Observe healthy foot care habits, given the presence of diabetes, with continued podiatric care and wound care -Annual flu vaccination is recommended in the setting of known PAD, in the absence of contra-indications.    ___________________________________________________________________   1Morley Kos MD, et al. 2016 AHA/ACC Guideline on the Management of Patients With Lower Extremity Peripheral Artery Disease: Executive Summary: A Report of the American College of Cardiology/American Heart Association Task Force on Clinical Practice Guidelines. J Am Coll Cardiol. 2017 Mar 21;69(11):1465-1508. doi: 10.1016/j.jacc.2016.11.008.   2 - Norgren L, et al. TASC II Working Group. Inter-society consensus for the management of peripheral arterial disease. Int Tressia Miners. 2007 Jun;26(2):81-157. Review. PubMed PMID: 74128786  3 - Hingorani A, et al. The management of diabetic foot: A clinical practice guideline by the Society for Vascular Surgery in collaboration with the Rachel and the Society  for Vascular Medicine. J Vasc Surg. 2016 Feb;63(2 Suppl):3S-21S. doi: 10.1016/j.jvs.2015.10.003. PubMed PMID: 76720947.  4 - Corinna Gab, Saab FA, Luberta Mutter, Grant Ruts, Ewell Poe, Driver VR, [place], Lookstein R, van den Baldemar Lenis, Jaff MR, Guadalupe Dawn, Henao S, AlMahameed A, Katzen B. Digital Subtraction Angiography Prior to an Amputation for Critical  Limb Ischemia (CLI): An Expert Recommendation Statement From the CLI Global Society to Optimize Limb Salvage. J Endovasc Ther. 2020 Aug;27(4):540-546. doi: 10.1177/1526602820928590. Epub 2020 May 29. PMID: 09628366.    Thank you for this interesting consult.  I greatly enjoyed meeting Zoe Clay and look forward to participating in their care.  A copy of this report was sent to the requesting provider on this date.  Electronically Signed: Corrie Mckusick 03/27/2021, 10:23 AM   I spent a total of  60 Minutes   in remote  clinical consultation, greater than 50% of which was counseling/coordinating care for right great toe diabetic foot wound, possible angiogram/intervention.    Visit type: Audio only (telephone). Audio (no video) only due to patient's lack of internet/smartphone capability. Alternative for in-person consultation at [facility], [address] [place], [place], [place]. This visit type was conducted due to national recommendations for restrictions regarding the COVID-19 Pandemic (e.g. social distancing).  This format is felt to be most appropriate for this patient at this time.  All issues noted in this document were discussed and addressed.

## 2021-05-31 ENCOUNTER — Emergency Department (HOSPITAL_BASED_OUTPATIENT_CLINIC_OR_DEPARTMENT_OTHER)
Admission: EM | Admit: 2021-05-31 | Discharge: 2021-05-31 | Disposition: A | Discharge status: Home / Self Care | Payer: Self-pay | Attending: Emergency Medicine | Admitting: Emergency Medicine

## 2021-05-31 ENCOUNTER — Other Ambulatory Visit: Payer: Self-pay

## 2021-05-31 ENCOUNTER — Encounter (HOSPITAL_BASED_OUTPATIENT_CLINIC_OR_DEPARTMENT_OTHER): Payer: Self-pay

## 2021-05-31 DIAGNOSIS — I10 Essential (primary) hypertension: Secondary | ICD-10-CM | POA: Insufficient documentation

## 2021-05-31 DIAGNOSIS — K0889 Other specified disorders of teeth and supporting structures: Secondary | ICD-10-CM | POA: Insufficient documentation

## 2021-05-31 DIAGNOSIS — E1140 Type 2 diabetes mellitus with diabetic neuropathy, unspecified: Secondary | ICD-10-CM | POA: Insufficient documentation

## 2021-05-31 MED ORDER — PENICILLIN V POTASSIUM 500 MG PO TABS
500.0000 mg | ORAL_TABLET | Freq: Four times a day (QID) | ORAL | 0 refills | Status: AC
Start: 2021-05-31 — End: 2021-06-07

## 2021-05-31 NOTE — Discharge Instructions (Signed)
He was seen in the emergency room today with dental pain.  I am calling in a prescription for antibiotics and will have you follow with a dentist for further evaluation.  Return with any new or suddenly worsening symptoms.

## 2021-05-31 NOTE — ED Provider Notes (Signed)
Emergency Department Provider Note   I have reviewed the triage vital signs and the nursing notes.   HISTORY  Chief Complaint Dental Pain   HPI Zoe Clay is a 46 y.o. female with past history reviewed below presents to the emergency department for evaluation of right lower dental pain.  Patient has developed pain over the past several days along with an area of swelling.  She is concerned for developing infection.  No fevers.  No trismus.  Clear voice.  She is not having trouble breathing or swallowing.   Past Medical History:  Diagnosis Date   Bipolar 1 disorder ([place])    Diabetes mellitus    High cholesterol    Hypertension    Peripheral neuropathy    Schizophrenia, acute ([place])    Seizures ([place])    last seizure May 2016   Thyroid disease     Review of Systems  Constitutional: No fever/chills Eyes: No visual changes. ENT: No sore throat. Positive dental pain.  Cardiovascular: Denies chest pain. Respiratory: Denies shortness of breath. Gastrointestinal: No abdominal pain.. Skin: Negative for rash. Neurological: Negative for headaches.   ____________________________________________   PHYSICAL EXAM:  VITAL SIGNS: ED Triage Vitals  Enc Vitals Group     BP 05/31/21 0759 (!) 115/93     Pulse Rate 05/31/21 0759 98     Resp 05/31/21 0759 18     Temp 05/31/21 0759 98.1 F (36.7 C)     Temp Source 05/31/21 0759 Oral     SpO2 05/31/21 0759 100 %   Constitutional: Alert and oriented. Well appearing and in no acute distress. Eyes: Conjunctivae are normal.  Head: Atraumatic. Nose: No congestion/rhinnorhea. Mouth/Throat: Mucous membranes are moist.  No dental fracture.  No trismus.  Patient speaking with a clear voice.  Soft submandibular compartment.  No visible abscess.  Neck: No stridor.  Cardiovascular:  Good peripheral circulation.  Respiratory: Normal respiratory effort.  Gastrointestinal: No distention.  Musculoskeletal: No gross deformities of  extremities. Neurologic:  Normal speech and language. Skin:  Skin is warm, dry and intact. No rash noted.   ____________________________________________   PROCEDURES  Procedure(s) performed:   Procedures  None  ____________________________________________   INITIAL IMPRESSION / ASSESSMENT AND PLAN / ED COURSE  Pertinent labs & imaging results that were available during my care of the patient were reviewed by me and considered in my medical decision making (see chart for details).   This patient is Presenting for Evaluation of dental pain, which does require a range of treatment options, and is a complaint that involves a moderate risk of morbidity and mortality.  The Differential Diagnoses include dental injury, abscess, deep neck infection, Ludwig's angina.   Medical Decision Making: Summary:  Patient presents to the emergency department with dental pain.  No visible abscess for drainage.  No trismus or other hard signs to suspect deeper space neck infection.  Plan for penicillin and will have the patient follow-up with dentist for further evaluation, x-rays, treatment consideration.   Disposition: discharge  ____________________________________________  FINAL CLINICAL IMPRESSION(S) / ED DIAGNOSES  Final diagnoses:  Pain, dental     NEW OUTPATIENT MEDICATIONS STARTED DURING THIS VISIT:  New Prescriptions   PENICILLIN V POTASSIUM (VEETID) 500 MG TABLET    Take 1 tablet (500 mg total) by mouth 4 (four) times daily for 7 days.    Note:  This document was prepared using Dragon voice recognition software and may include unintentional dictation errors.  Nanda Quinton, MD, [facility]    Macyn Shropshire, [facility], MD 05/31/21 [phone]

## 2021-05-31 NOTE — ED Triage Notes (Signed)
Pt reports right lower dental pain since monday

## 2021-06-07 ENCOUNTER — Other Ambulatory Visit: Payer: Self-pay | Admitting: Interventional Radiology

## 2021-06-07 DIAGNOSIS — S91301A Unspecified open wound, right foot, initial encounter: Secondary | ICD-10-CM

## 2021-07-09 ENCOUNTER — Inpatient Hospital Stay: Admission: RE | Admit: 2021-07-09 | Payer: Self-pay | Source: Ambulatory Visit

## 2021-09-09 ENCOUNTER — Encounter (HOSPITAL_BASED_OUTPATIENT_CLINIC_OR_DEPARTMENT_OTHER): Payer: Self-pay | Admitting: Emergency Medicine

## 2021-09-09 ENCOUNTER — Other Ambulatory Visit: Payer: Self-pay

## 2021-09-09 ENCOUNTER — Observation Stay (HOSPITAL_BASED_OUTPATIENT_CLINIC_OR_DEPARTMENT_OTHER)
Admission: EM | Admit: 2021-09-09 | Discharge: 2021-09-10 | Disposition: A | Discharge status: Home / Self Care | Payer: Commercial Managed Care - HMO | Attending: Family Medicine | Admitting: Family Medicine

## 2021-09-09 ENCOUNTER — Emergency Department (HOSPITAL_BASED_OUTPATIENT_CLINIC_OR_DEPARTMENT_OTHER): Payer: Commercial Managed Care - HMO

## 2021-09-09 DIAGNOSIS — Z7982 Long term (current) use of aspirin: Secondary | ICD-10-CM | POA: Diagnosis not present

## 2021-09-09 DIAGNOSIS — R471 Dysarthria and anarthria: Secondary | ICD-10-CM | POA: Diagnosis not present

## 2021-09-09 DIAGNOSIS — R29898 Other symptoms and signs involving the musculoskeletal system: Secondary | ICD-10-CM

## 2021-09-09 DIAGNOSIS — F17210 Nicotine dependence, cigarettes, uncomplicated: Secondary | ICD-10-CM | POA: Diagnosis not present

## 2021-09-09 DIAGNOSIS — E039 Hypothyroidism, unspecified: Secondary | ICD-10-CM | POA: Diagnosis present

## 2021-09-09 DIAGNOSIS — E785 Hyperlipidemia, unspecified: Secondary | ICD-10-CM

## 2021-09-09 DIAGNOSIS — R739 Hyperglycemia, unspecified: Secondary | ICD-10-CM

## 2021-09-09 DIAGNOSIS — Z794 Long term (current) use of insulin: Secondary | ICD-10-CM | POA: Insufficient documentation

## 2021-09-09 DIAGNOSIS — R200 Anesthesia of skin: Secondary | ICD-10-CM

## 2021-09-09 DIAGNOSIS — E1165 Type 2 diabetes mellitus with hyperglycemia: Principal | ICD-10-CM | POA: Insufficient documentation

## 2021-09-09 DIAGNOSIS — G40909 Epilepsy, unspecified, not intractable, without status epilepticus: Secondary | ICD-10-CM

## 2021-09-09 DIAGNOSIS — Z79899 Other long term (current) drug therapy: Secondary | ICD-10-CM | POA: Insufficient documentation

## 2021-09-09 DIAGNOSIS — I10 Essential (primary) hypertension: Secondary | ICD-10-CM | POA: Diagnosis not present

## 2021-09-09 DIAGNOSIS — E1140 Type 2 diabetes mellitus with diabetic neuropathy, unspecified: Secondary | ICD-10-CM | POA: Diagnosis not present

## 2021-09-09 DIAGNOSIS — R531 Weakness: Secondary | ICD-10-CM

## 2021-09-09 DIAGNOSIS — R519 Headache, unspecified: Secondary | ICD-10-CM

## 2021-09-09 LAB — BASIC METABOLIC PANEL
Anion gap: 8 (ref 5–15)
BUN: 17 mg/dL (ref 6–20)
CO2: 23 mmol/L (ref 22–32)
Calcium: 9 mg/dL (ref 8.9–10.3)
Chloride: 108 mmol/L (ref 98–111)
Creatinine, Ser: 0.72 mg/dL (ref 0.44–1.00)
GFR, Estimated: >60 mL/min (ref >60)
Glucose, Bld: 151 mg/dL — ABNORMAL HIGH (ref 70–99)
Potassium: 3.3 mmol/L — ABNORMAL LOW (ref 3.5–5.1)
Sodium: 139 mmol/L (ref 135–145)

## 2021-09-09 LAB — COMPREHENSIVE METABOLIC PANEL
ALT: 22 U/L (ref 0–44)
AST: 19 U/L (ref 15–41)
Albumin: 4.2 g/dL (ref 3.5–5.0)
Alkaline Phosphatase: 155 U/L — ABNORMAL HIGH (ref 38–126)
Anion gap: 14 (ref 5–15)
BUN: 23 mg/dL — ABNORMAL HIGH (ref 6–20)
CO2: 22 mmol/L (ref 22–32)
Calcium: 9.6 mg/dL (ref 8.9–10.3)
Chloride: 95 mmol/L — ABNORMAL LOW (ref 98–111)
Creatinine, Ser: 0.96 mg/dL (ref 0.44–1.00)
GFR, Estimated: >60 mL/min (ref >60)
Glucose, Bld: 728 mg/dL (ref 70–99)
Potassium: 4.9 mmol/L (ref 3.5–5.1)
Sodium: 131 mmol/L — ABNORMAL LOW (ref 135–145)
Total Bilirubin: 0.7 mg/dL (ref 0.3–1.2)
Total Protein: 8.2 g/dL — ABNORMAL HIGH (ref 6.5–8.1)

## 2021-09-09 LAB — OSMOLALITY: Osmolality: 298 mOsm/kg — ABNORMAL HIGH (ref 275–295)

## 2021-09-09 LAB — PROTIME-INR
INR: 1 (ref 0.8–1.2)
Prothrombin Time: 12.8 seconds (ref 11.4–15.2)

## 2021-09-09 LAB — CBC WITH DIFFERENTIAL/PLATELET
Abs Immature Granulocytes: 0.02 10*3/uL (ref 0.00–0.07)
Basophils Absolute: 0 10*3/uL (ref 0.0–0.1)
Basophils Relative: 0 %
Eosinophils Absolute: 0 10*3/uL (ref 0.0–0.5)
Eosinophils Relative: 0 %
HCT: 35.4 % — ABNORMAL LOW (ref 36.0–46.0)
Hemoglobin: 11.9 g/dL — ABNORMAL LOW (ref 12.0–15.0)
Immature Granulocytes: 0 %
Lymphocytes Relative: 24 %
Lymphs Abs: 2 10*3/uL (ref 0.7–4.0)
MCH: 30.6 pg (ref 26.0–34.0)
MCHC: 33.6 g/dL (ref 30.0–36.0)
MCV: 91 fL (ref 80.0–100.0)
Monocytes Absolute: 0.3 10*3/uL (ref 0.1–1.0)
Monocytes Relative: 3 %
Neutro Abs: 5.9 10*3/uL (ref 1.7–7.7)
Neutrophils Relative %: 73 %
Platelets: 417 10*3/uL — ABNORMAL HIGH (ref 150–400)
RBC: 3.89 MIL/uL (ref 3.87–5.11)
RDW: 15.7 % — ABNORMAL HIGH (ref 11.5–15.5)
WBC: 8.2 10*3/uL (ref 4.0–10.5)
nRBC: 0 % (ref 0.0–0.2)

## 2021-09-09 LAB — I-STAT VENOUS BLOOD GAS, ED
Acid-base deficit: 1 mmol/L (ref 0.0–2.0)
Bicarbonate: 25.1 mmol/L (ref 20.0–28.0)
Calcium, Ion: 1.25 mmol/L (ref 1.15–1.40)
HCT: 36 % (ref 36.0–46.0)
Hemoglobin: 12.2 g/dL (ref 12.0–15.0)
O2 Saturation: 47 %
Potassium: 4.4 mmol/L (ref 3.5–5.1)
Sodium: 131 mmol/L — ABNORMAL LOW (ref 135–145)
TCO2: 26 mmol/L (ref 22–32)
pCO2, Ven: 45.7 mmHg (ref 44–60)
pH, Ven: 7.348 (ref 7.25–7.43)
pO2, Ven: 27 mmHg — CL (ref 32–45)

## 2021-09-09 LAB — GLUCOSE, CAPILLARY
Glucose-Capillary: 176 mg/dL — ABNORMAL HIGH (ref 70–99)
Glucose-Capillary: 78 mg/dL (ref 70–99)

## 2021-09-09 LAB — CBG MONITORING, ED
Glucose-Capillary: 555 mg/dL (ref 70–99)
Glucose-Capillary: 468 mg/dL — ABNORMAL HIGH (ref 70–99)
Glucose-Capillary: 323 mg/dL — ABNORMAL HIGH (ref 70–99)

## 2021-09-09 LAB — TSH: TSH: 17.451 u[IU]/mL — ABNORMAL HIGH (ref 0.350–4.500)

## 2021-09-09 LAB — BETA-HYDROXYBUTYRIC ACID: Beta-Hydroxybutyric Acid: 2.6 mmol/L — ABNORMAL HIGH (ref 0.05–0.27)

## 2021-09-09 IMAGING — CT CT HEAD W/O CM
3 series · 15 of 47 positions shown, 18 images · non-contrast
Comparison: [DATE]

CLINICAL DATA: Dysarthria, right arm and leg numbness/weakness,
headaches



[Series 2: head wo · axial · 0.42mm/px · z∈[+704,+829]mm · 9 of 30 slices shown, 12 images]
[im 3/30  brain]
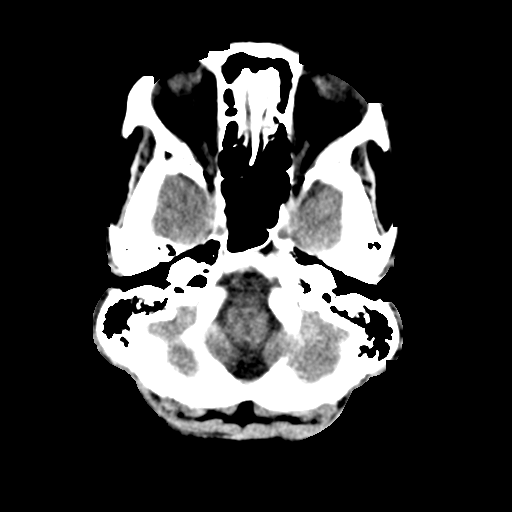
[im 3/30  bone]
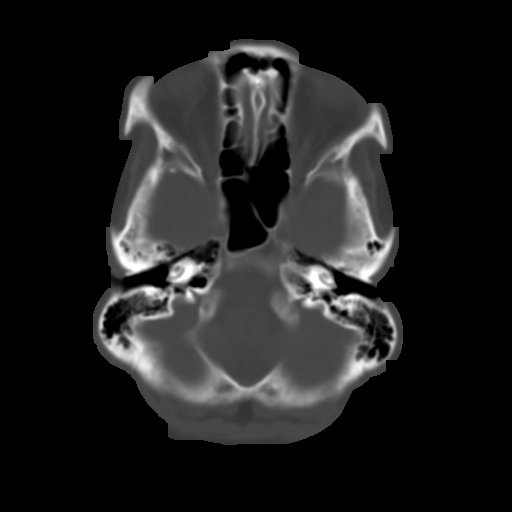
[im 6/30  brain]
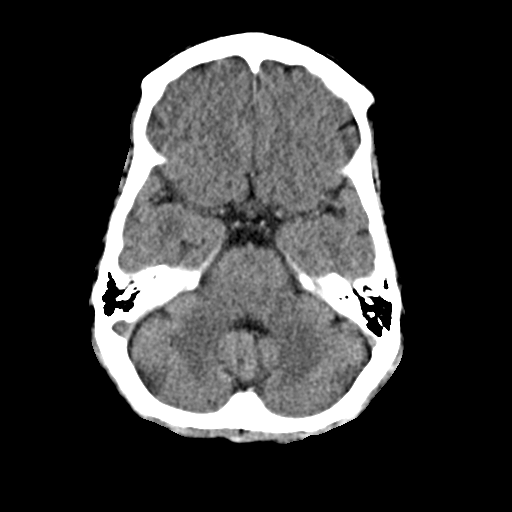
[im 9/30  brain]
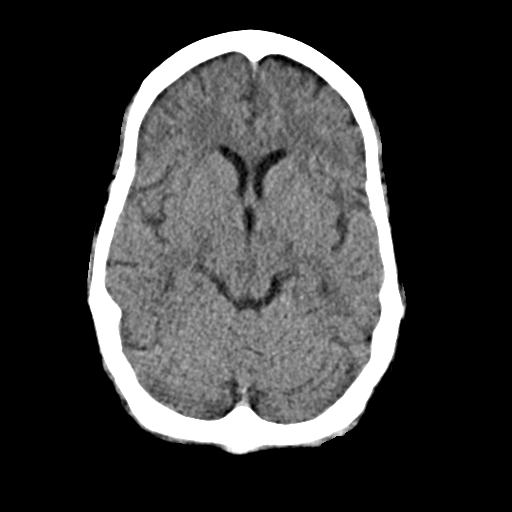
[im 12/30  brain]
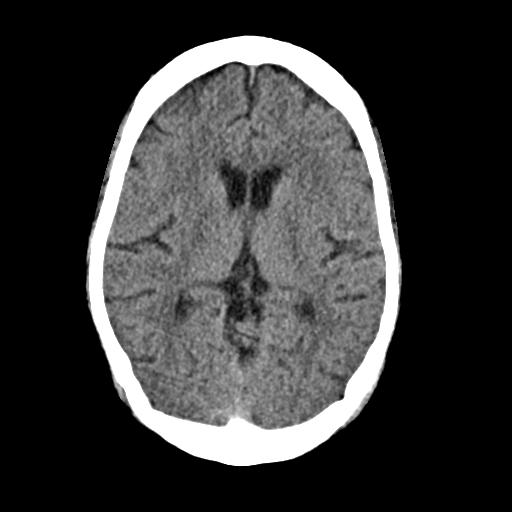
[im 16/30  brain]
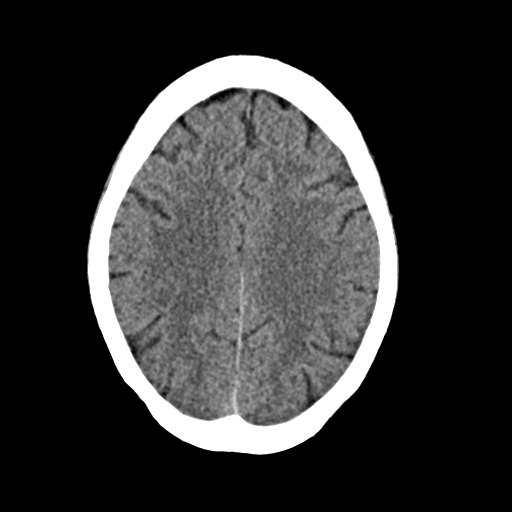
[im 16/30  bone]
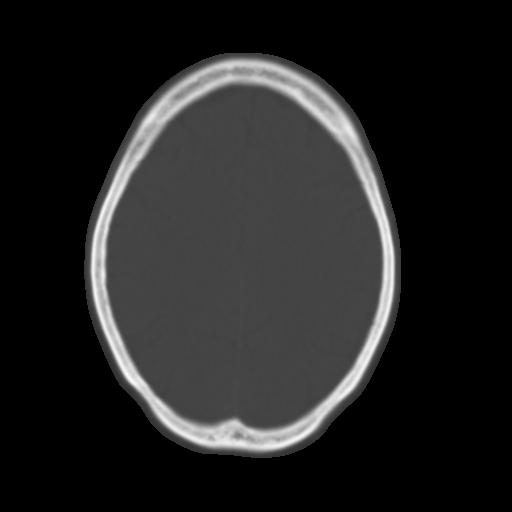
[im 19/30  brain]
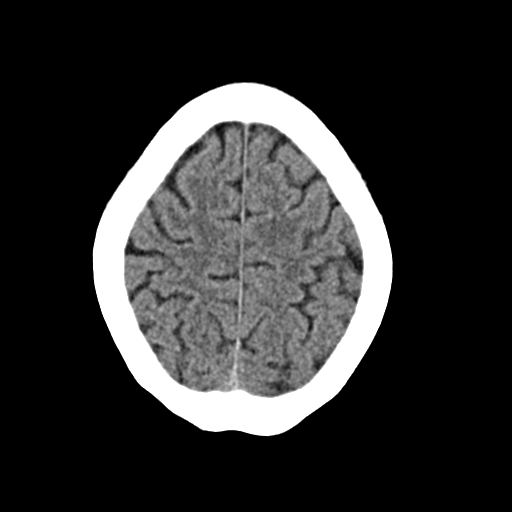
[im 22/30  brain]
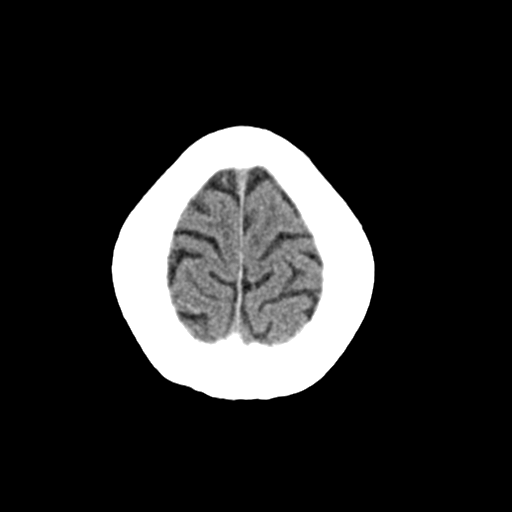
[im 25/30  brain]
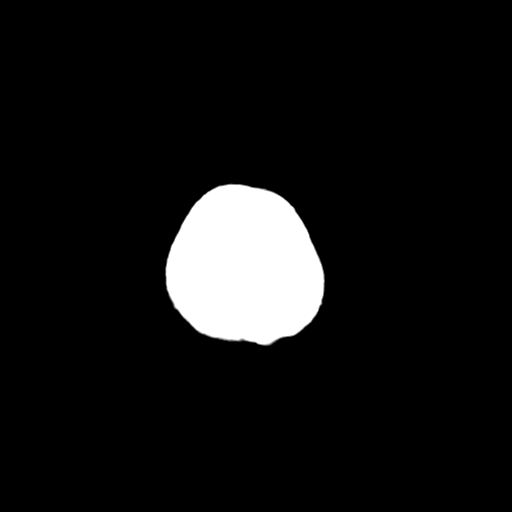
[im 28/30  brain]
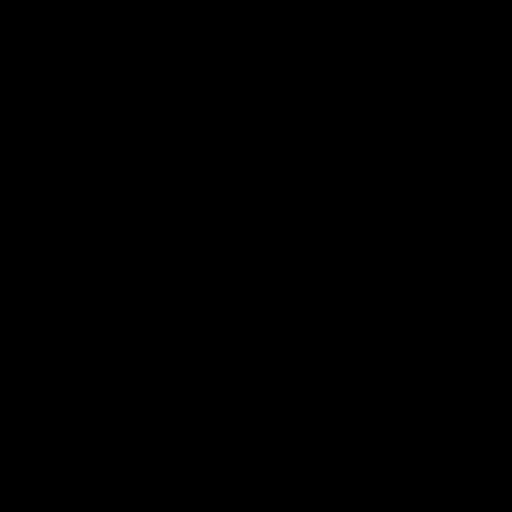
[im 28/30  bone]
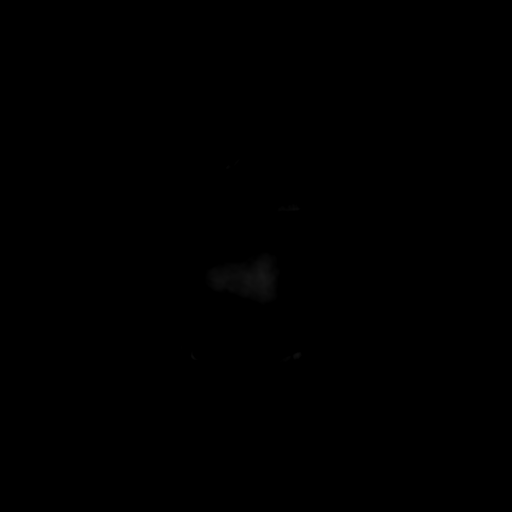

[Series 4: coronal soft · coronal · 0.28mm/px · 3 of 67 slices shown]
[im 23/67  brain]
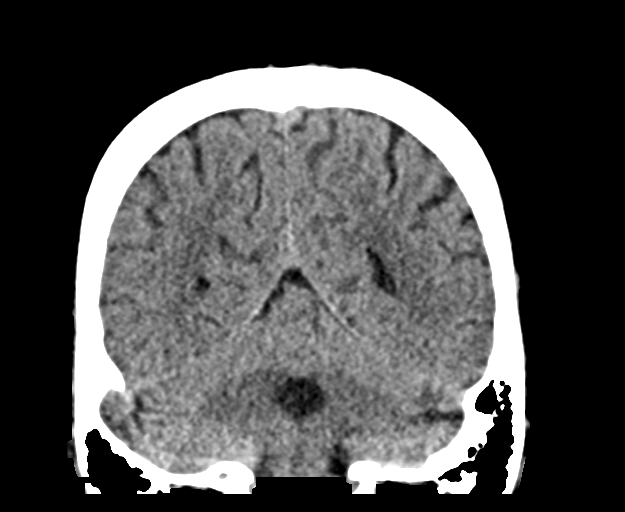
[im 30/67  brain]
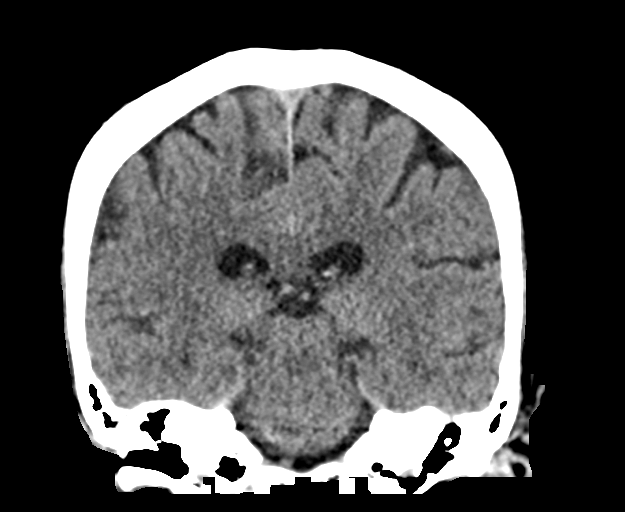
[im 37/67  brain]
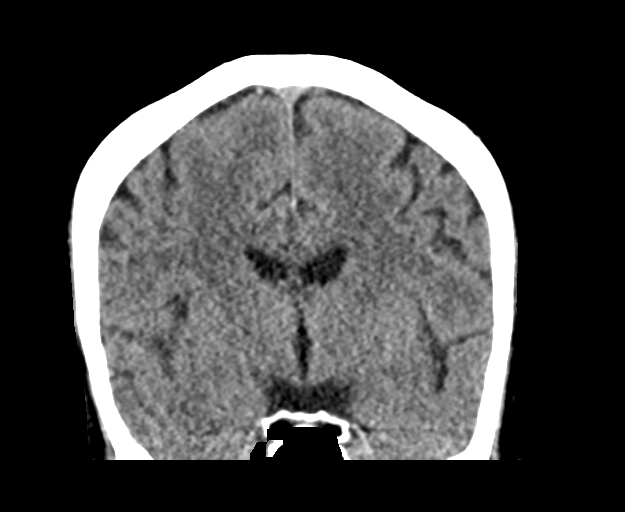

[Series 5: sag soft · sagittal · 0.28mm/px · 3 of 67 slices shown]
[im 23/67  brain]
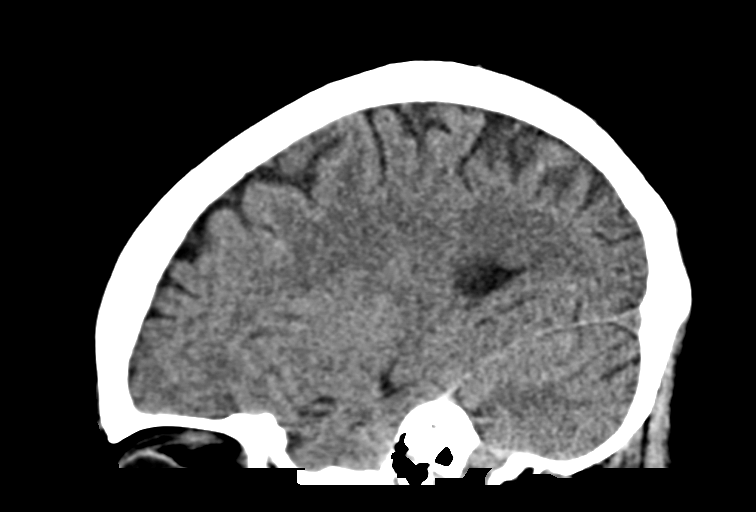
[im 34/67  brain]
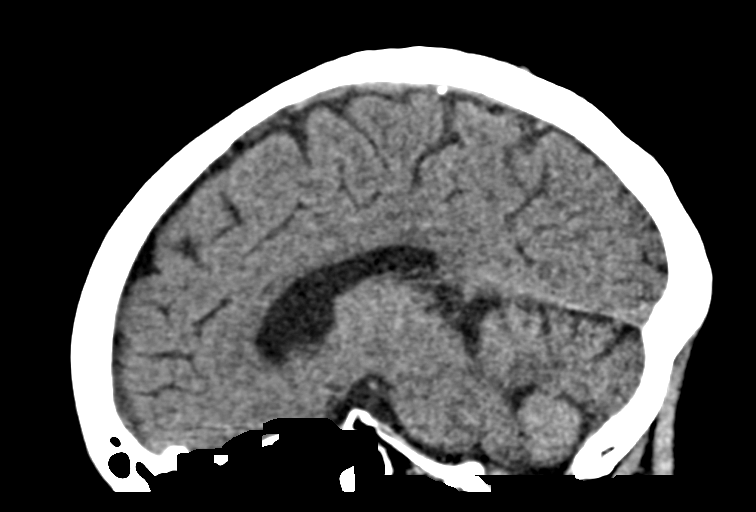
[im 45/67  brain]
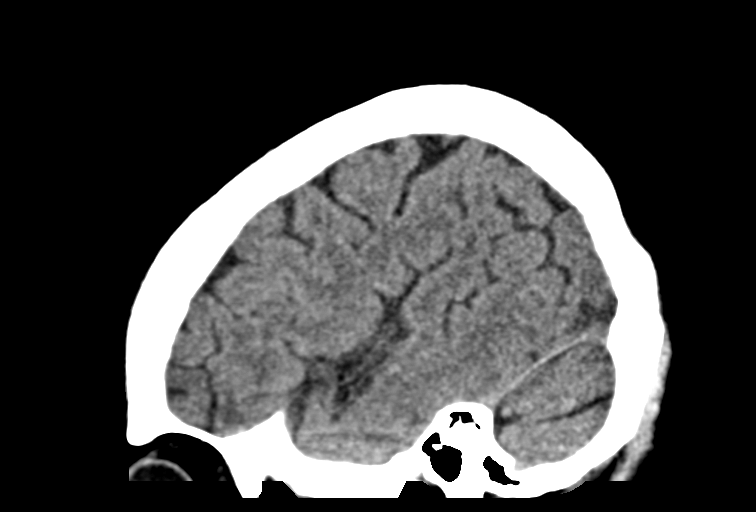

[15 of 47 positions shown; findings below may reference images not displayed]

FINDINGS: Brain: No evidence of acute infarction, hemorrhage, hydrocephalus,
extra-axial collection or mass lesion/mass effect.

Vascular: No hyperdense vessel or unexpected calcification.

Skull: Normal. Negative for fracture or focal lesion.

Sinuses/Orbits: The visualized paranasal sinuses are essentially
clear. The mastoid air cells are unopacified.

Other: None.
IMPRESSION: Normal head CT.

## 2021-09-09 MED ORDER — DEXTROSE IN LACTATED RINGERS 5 % IV SOLN: INTRAVENOUS | Status: DC

## 2021-09-09 MED ORDER — SODIUM CHLORIDE 0.9 % IV BOLUS
1000.0000 mL | Freq: Once | INTRAVENOUS | Status: AC
  Administered 2021-09-09: 1000 mL via INTRAVENOUS

## 2021-09-09 MED ORDER — POTASSIUM CHLORIDE 10 MEQ/100ML IV SOLN
10.0000 meq | INTRAVENOUS | Status: AC
  Administered 2021-09-09 (×2): 10 meq via INTRAVENOUS
  Filled 2021-09-09 (×2): qty 100

## 2021-09-09 MED ORDER — DEXTROSE 50 % IV SOLN
0.0000 mL | INTRAVENOUS | Status: DC | PRN
  Administered 2021-09-10: 50 mL via INTRAVENOUS
  Filled 2021-09-09 (×2): qty 50

## 2021-09-09 MED ORDER — INSULIN REGULAR(HUMAN) IN NACL 100-0.9 UT/100ML-% IV SOLN
INTRAVENOUS | Status: DC
  Administered 2021-09-09: 13 [IU]/h via INTRAVENOUS
  Filled 2021-09-09: qty 100

## 2021-09-09 MED ORDER — LACTATED RINGERS IV BOLUS
20.0000 mL/kg | Freq: Once | INTRAVENOUS | Status: AC
  Administered 2021-09-09: 1578 mL via INTRAVENOUS

## 2021-09-09 MED ORDER — LACTATED RINGERS IV SOLN: INTRAVENOUS | Status: DC

## 2021-09-09 NOTE — ED Notes (Signed)
ED MD informed of blood glucose level of '728mg'$ /dl, new orders rec and immediately implemented

## 2021-09-09 NOTE — ED Provider Notes (Signed)
Accepted handoff at shift change from Dr. Sherry Ruffing. Please see prior provider note for more detail.   Briefly: Patient is 46 y.o.   Sees be having right upper extremity weakness and paresthesias for 1 week.  Seems that the problem has been constant and has not changed since it began.  She states she has had some intermittent headaches associated with this.  She denies any other symptoms apart from some tingling sensation in her right arm   DDX: concern for MS, stroke  Plan: MRI brain with and without, MRA head and neck     Physical Exam  BP 131/89   Pulse (!) 101   Temp 97.6 F (36.4 C) (Oral)   Resp 20   Ht '5\' 5"'$  (1.651 m)   Wt 78.9 kg   SpO2 100%   BMI 28.96 kg/m   Physical Exam Vitals and nursing note reviewed.  Constitutional:      Comments: Chronically unwell appearing 46 year old female.  HENT:     Head: Normocephalic and atraumatic.     Nose: Nose normal.  Eyes:     General: No scleral icterus. Cardiovascular:     Rate and Rhythm: Regular rhythm. Tachycardia present.     Pulses: Normal pulses.     Heart sounds: Normal heart sounds.  Pulmonary:     Effort: Pulmonary effort is normal. No respiratory distress.     Breath sounds: No wheezing.  Abdominal:     Palpations: Abdomen is soft.     Tenderness: There is no abdominal tenderness.  Musculoskeletal:     Cervical back: Normal range of motion.     Right lower leg: No edema.     Left lower leg: No edema.  Skin:    General: Skin is warm and dry.     Capillary Refill: Capillary refill takes less than 2 seconds.  Neurological:     Mental Status: She is alert.     Comments: Mild right upper extremity weakness particularly in right grip  Smile symmetric  Sensation intact throughout  Psychiatric:        Mood and Affect: Mood normal.        Behavior: Behavior normal.    Procedures  .Critical Care Performed by: Tedd Sias, PA Authorized by: Tedd Sias, PA   Critical care provider statement:     Critical care time (minutes):  35   Critical care time was exclusive of:  Separately billable procedures and treating other patients and teaching time   Critical care was time spent personally by me on the following activities:  Development of treatment plan with patient or surrogate, review of old charts, re-evaluation of patient's condition, pulse oximetry, ordering and review of radiographic studies, ordering and review of laboratory studies, ordering and performing treatments and interventions, obtaining history from patient or surrogate, examination of patient and evaluation of patient's response to treatment   Care discussed with: admitting provider    ED Course / MDM   Clinical Course as of 09/09/21 2133  Mon Sep 09, 2021  2102 300 BS usually.   [WF]    Clinical Course User Index [WF] Tedd Sias, [place]   Medical Decision Making Amount and/or Complexity of Data Reviewed Labs: ordered. Radiology: ordered.  Risk Prescription drug management. Decision regarding hospitalization.   Patient with neurologic abnormalities concerning for MS versus stroke.  Consulted with hospitalist Dr. Marlowe Sax who will admit.  Requested I discussed with neurologist Dr. Curly Shores.  We will place a consult to her.  Patient admitted to medicine at this time.  Discussed with Dr. Curly Shores who is available for consult after MRI.       Tedd Sias, [place] 09/09/21 2231    Lajean Saver, MD 09/10/21 2135

## 2021-09-09 NOTE — ED Provider Notes (Addendum)
[facility] EMERGENCY DEPARTMENT Provider Note   CSN: [id number] Arrival date & time: 09/09/21  1533     History  Chief Complaint  Patient presents with   Numbness    Zoe Clay is a 46 y.o. female.  The history is provided by the patient and medical records. No language interpreter was used.  Neurologic Problem This is a new problem. The current episode started more than 1 week ago. The problem occurs constantly. The problem has not changed since onset.Associated symptoms include headaches (intermittent). Pertinent negatives include no chest pain, no abdominal pain and no shortness of breath. Nothing aggravates the symptoms. Nothing relieves the symptoms. She has tried nothing for the symptoms. The treatment provided no relief.      Home Medications Prior to Admission medications   Medication Sig Start Date End Date Taking? Authorizing Provider  amoxicillin-clavulanate (AUGMENTIN) 875-125 MG tablet Take 1 tablet by mouth every 12 (twelve) hours. 03/05/21   Doristine Devoid, PA-C  aspirin 81 MG EC tablet Take by mouth. 04/10/16   [provider]  Diclofenac Sodium CR 100 MG 24 hr tablet Take 1 tablet (100 mg total) by mouth daily. 01/01/21   Palumbo, April, MD  DULoxetine (CYMBALTA) 60 MG capsule Take 60 mg by mouth daily.    [provider]  gabapentin (NEURONTIN) 600 MG tablet Take 1 tablet (600 mg total) by mouth 2 (two) times daily. 01/10/15   Katheren Shams, DO  insulin aspart (NOVOLOG) 100 UNIT/ML injection Inject into the skin 3 (three) times daily before meals.    [provider]  insulin glargine (LANTUS) 100 UNIT/ML injection Inject 15 Units into the skin daily. 06/22/12   Regalado, Belkys A, MD  levothyroxine (SYNTHROID, LEVOTHROID) 125 MCG tablet Take 125 mcg by mouth daily.      [provider]  lidocaine (LIDODERM) 5 % Place 1 patch onto the skin daily. Remove & Discard patch within 12 hours or as directed by MD  01/01/21   Randal Buba, April, MD  lisinopril (PRINIVIL,ZESTRIL) 5 MG tablet Take 5 mg by mouth daily.    [provider]  phenytoin (DILANTIN) 100 MG ER capsule Take 200 mg by mouth 2 (two) times daily.     [provider]  simvastatin (ZOCOR) 20 MG tablet Take 20 mg by mouth every evening.    [provider]      Allergies    Contrast media [iodinated contrast media] and Iohexol    Review of Systems   Review of Systems  Constitutional:  Negative for chills, fatigue and fever.  HENT:  Negative for congestion.   Eyes:  Negative for visual disturbance.  Respiratory:  Negative for cough, chest tightness, shortness of breath and wheezing.   Cardiovascular:  Negative for chest pain.  Gastrointestinal:  Negative for abdominal pain, constipation, diarrhea, nausea and vomiting.  Genitourinary:  Negative for dysuria.  Musculoskeletal:  Negative for back pain, neck pain and neck stiffness.  Skin:  Negative for rash and wound.  Neurological:  Positive for speech difficulty, weakness, numbness and headaches (intermittent). Negative for dizziness, facial asymmetry and light-headedness.  Psychiatric/Behavioral:  Negative for agitation and confusion.   All other systems reviewed and are negative.  Physical Exam Updated Vital Signs BP (!) 147/88 (BP Location: Left Arm)   Pulse (!) 107   Temp 97.8 F (36.6 C) (Oral)   Resp 16   Ht '5\' 5"'$  (1.651 m)   Wt 78.9 kg   SpO2 100%  BMI 28.96 kg/m  Physical Exam Vitals and nursing note reviewed.  Constitutional:      General: She is not in acute distress.    Appearance: She is well-developed. She is not ill-appearing, toxic-appearing or diaphoretic.  HENT:     Head: Normocephalic and atraumatic.     Nose: No congestion.     Mouth/Throat:     Mouth: Mucous membranes are moist.  Eyes:     Extraocular Movements: Extraocular movements intact.     Conjunctiva/sclera: Conjunctivae normal.  Neck:     Vascular: Carotid bruit  present.  Cardiovascular:     Rate and Rhythm: Normal rate and regular rhythm.     Heart sounds: No murmur heard. Pulmonary:     Effort: Pulmonary effort is normal. No respiratory distress.     Breath sounds: Normal breath sounds. No wheezing, rhonchi or rales.  Chest:     Chest wall: No tenderness.  Abdominal:     General: Abdomen is flat.     Palpations: Abdomen is soft.     Tenderness: There is no abdominal tenderness. There is no right CVA tenderness, left CVA tenderness, guarding or rebound.  Musculoskeletal:        General: No swelling or tenderness.     Cervical back: Neck supple. No tenderness.  Skin:    General: Skin is warm and dry.     Capillary Refill: Capillary refill takes less than 2 seconds.     Findings: No erythema or rash.  Neurological:     Mental Status: She is alert.     Cranial Nerves: Dysarthria present. No facial asymmetry.     Sensory: Sensory deficit present.     Motor: Weakness and abnormal muscle tone present.     Comments: Patient has subtle right arm drift compared to left.  Decreased grip strength on right compared to left.  Numbness in right arm and right leg compared to left.  Weakness in right leg with raise compared to left.  Symmetric smile.  Suspect mild dysarthria although she is able to get everything out she wants to, doubt aphasia.  Pupils symmetric and reactive with normal extraocular movements.  Psychiatric:        Mood and Affect: Mood normal.    ED Results / Procedures / Treatments   Labs (all labs ordered are listed, but only abnormal results are displayed) Labs Reviewed  CBC WITH DIFFERENTIAL/PLATELET  COMPREHENSIVE METABOLIC PANEL  PROTIME-INR  TSH    EKG EKG Interpretation  Date/Time:  Monday Sep 09 2021 17:52:22 EDT Ventricular Rate:  103 PR Interval:  144 QRS Duration: 87 QT Interval:  344 QTC Calculation: 451 R Axis:   50 Text Interpretation: Sinus tachycardia Ventricular premature complex Borderline T wave  abnormalities when compared to prior, new PVC. No STEMI Confirmed by Antony Blackbird [phone]) on 09/09/2021 6:26:03 PM  Radiology CT Head Wo Contrast  Result Date: 09/09/2021 CLINICAL DATA:  Dysarthria, right arm and leg numbness/weakness, headaches EXAM: CT HEAD WITHOUT CONTRAST TECHNIQUE: Contiguous axial images were obtained from the base of the skull through the vertex without intravenous contrast. RADIATION DOSE REDUCTION: This exam was performed according to the departmental dose-optimization program which includes automated exposure control, adjustment of the mA and/or kV according to patient size and/or use of iterative reconstruction technique. COMPARISON:  03/07/2021 FINDINGS: Brain: No evidence of acute infarction, hemorrhage, hydrocephalus, extra-axial collection or mass lesion/mass effect. Vascular: No hyperdense vessel or unexpected calcification. Skull: Normal. Negative for fracture or focal lesion. Sinuses/Orbits: The  visualized paranasal sinuses are essentially clear. The mastoid air cells are unopacified. Other: None. IMPRESSION: Normal head CT. Electronically Signed   By: Julian Hy M.D.   On: 09/09/2021 17:57    Procedures Procedures   CRITICAL CARE Performed by: Gwenyth Allegra Saahir Prude Total critical care time: 45 minutes Critical care time was exclusive of separately billable procedures and treating other patients. Critical care was necessary to treat or prevent imminent or life-threatening deterioration. Critical care was time spent personally by me on the following activities: development of treatment plan with patient and/or surrogate as well as nursing, discussions with consultants, evaluation of patient's response to treatment, examination of patient, obtaining history from patient or surrogate, ordering and performing treatments and interventions, ordering and review of laboratory studies, ordering and review of radiographic studies, pulse oximetry and re-evaluation of  patient's condition.   Medications Ordered in ED Medications - No data to display  ED Course/ Medical Decision Making/ A&P                           Medical Decision Making Amount and/or Complexity of Data Reviewed Labs: ordered. Radiology: ordered.  Risk Prescription drug management.    ORIANA HORIUCHI is a 46 y.o. female with a past medical history significant for type 1 diabetes with previous DKA, hypothyroidism, seizure disorder, schizophrenia, and chronic peripheral neuropathy who presents with approximately 1 week of right arm and right leg numbness and weakness with intermittent headaches.  Patient reports that for the last week or so she has had weakness and numbness in her right arm and right leg.  She has been dropping things.  She has had these her left hand to compensate.  She is right-handed.  She reports has had some intermittent headaches that come and go however her arm and leg symptoms have been persistent even when she does not have discomfort in her head.  She reports her headaches are migrainous in nature.  She has photophobia with them.  She denies any neck pain or neck stiffness and denies fevers, chills, J[place], cough.  Denies nausea, vomiting, constipation, or diarrhea.  Denies any vision changes but does say that people tell her she is having a change in her speech.  She does not describe aphasia and just is describing some dysarthria as she reports that sound slurred.  She denies any trauma.  Denies any other complaints.  On exam, patient does have numbness and weakness in her right arm and right leg compared to the left.  She did have a slight arm drift on the right.  Symmetric smile.  Normal sensation of the face.  Pupils are symmetric and reactive normal extraocular movements.  Abdomen and chest nontender.  Lungs clear.  No murmur.  I did appreciate carotid bruits bilaterally.  Due to to the patient's unilateral symptoms I am somewhat concerned about ruling out  stroke.  Initially due to the carotid bruits, I was going to get a CT of the head and neck however patient does have a contrast allergy.  We will get noncontrasted CT head and will likely discuss with neurology if she needs MRI or even MRA to further evaluate.  We will get other screening labs as well.  She reports her headache is mild to moderate now only a 4 out of 10 in severity and does not want any headache medicine.  Anticipate reassessment after labs and imaging.  6:26 PM CT head without contrast did not  show acute abnormality.  [facility] called and spoke to Dr. Cheral Marker with neurology and he agrees with my concern for either stroke not seen on CT versus MS.  He recommended MRA head and neck and MRI brain with and without contrast.  He recommends transfer to [facility], ED to ED to get these images and then when work-up is completed if she is developing symptoms, contact the neurology team.  Patient agrees with this plan and will be transferred.   7:18 PM Just prior to patient getting transferred, her metabolic panel and other labs began to return.  Her glucose is in the 700s although she does not appear to be acutely in DKA.  Her anion gap is only 14 and her CO2 is 22.  She is not acidotic with a pH of 7.34 on blood gas.  She does not have a leukocytosis and has a mild anemia.  I spoke to the neurology team again and he agrees that this still could be just an acute reaction to stroke, seizure, or something else.  We discussed if it could be HHS or if that could play a role.  He recommended treating with the insulin drip and she will likely need admission regardless.  We will go ahead and call for admission but she will still need ED transfer to get the MRIs.  We will call for admission as well.  Spoke to medicine who does not want to officially admit her until she gets transferred.  They requested we call medicine after she gets to the [facility] emergency department to discuss admission.  She will likely  admission for the hyperglycemia as well as the neurologic deficits.  Patient does not appear to be in DKA and does report she did not take her insulin this morning.  Patient transferred.   Final Clinical Impression(s) / ED Diagnoses Final diagnoses:  Right arm numbness  Right arm weakness  Right leg numbness  Right leg weakness  Intermittent headache  Dysarthria    Rx / DC Orders ED Discharge Orders     None       Clinical Impression: 1. Right arm numbness   2. Right arm weakness   3. Right leg numbness   4. Right leg weakness   5. Intermittent headache   6. Dysarthria     Disposition: Patient transferred ED to ED to get MRIs not available at this facility.  Dr. Billy Fischer excepted in transfer and if MRIs show anything concerning, will need to also be seen by neurology.  Dr. Cheral Marker recommended transfer to collect the images.  This note was prepared with assistance of Systems analyst. Occasional wrong-word or sound-a-like substitutions may have occurred due to the inherent limitations of voice recognition software.      Welden Hausmann, Gwenyth Allegra, MD 09/09/21 1835    Almond Fitzgibbon, Gwenyth Allegra, MD 09/09/21 2031

## 2021-09-09 NOTE — ED Notes (Signed)
Care handoff to [place] @ [facility]

## 2021-09-09 NOTE — ED Notes (Signed)
[facility] phoned to request transport from [facility] ED to [facility] ED per ED MD orders for MRI, spoke with Marcello Moores

## 2021-09-09 NOTE — ED Notes (Addendum)
Updated report given to [facility] re: pt's glucose of 728 Also updated care handoff given to [facility] RN kelly @ [facility] Blood sugar 555, insulin drip remains at 13 units/hr

## 2021-09-09 NOTE — ED Triage Notes (Signed)
Pt arrived POV ambulatory reports right sided numbness x 1 week. Reports difficulty holding objects in right hand. Reports headache 1 day after numbness started. Current smoker. Denies weakness, blood thinners, changes in vision, balance issues.

## 2021-09-09 NOTE — H&P (Signed)
History and Physical    Zoe Clay IFO:[id number] DOB: [birth year] DOA: 09/09/2021  PCP: Inc, [facility]  Patient coming from: [facility] ED  Chief Complaint: Right-sided numbness and weakness  HPI: Zoe Clay is a 46 y.o. female with medical history significant of schizophrenia, bipolar disorder, seizures, hypothyroidism, insulin-dependent diabetes, peripheral neuropathy, hypertension, hyperlipidemia presented to [place] ED with right-sided numbness and weakness. CT head normal.  Labs showing WBC 8.2, hemoglobin 11.9, platelet count 417k.  Sodium 131, potassium 4.9, chloride 95, bicarb 22, anion gap 14, BUN 23, creatinine 0.9, glucose 728.  Alkaline phosphatase mildly elevated consistent with prior labs, remainder of LFTs normal.  TSH pending.  Beta hydroxybutyrate acid, UA, and serum osmolarity pending.  Blood gas showing normal pH.  Patient was given IV insulin, IV fluids, and potassium supplement.  ED physician discussed the case with Dr. Cheral Marker with neurology who recommended MRI for further evaluation.  Patient was sent to [facility] ED for MRI brain and MRA head and neck.  Patient reports 1.5-week history of right upper and lower extremity numbness.  Numbness is worse in the right upper extremity and it also feels weak.  No leg weakness, she is able to walk.  Denies history of stroke.  States she normally takes Lantus 20 units at bedtime and NovoLog sliding scale with meals but ran out of Lantus a week ago and was not able to pick it up from the pharmacy due to insurance problems.  She has only been using sliding scale insulin this past week.  Denies fevers, chills, cough, shortness of breath, chest pain, nausea, vomiting, abdominal pain, diarrhea, dysuria, or urinary frequency/urgency.  Review of Systems:  Review of Systems  All other systems reviewed and are negative.  Past Medical History:  Diagnosis Date   Bipolar 1 disorder ([place])     Diabetes mellitus    High cholesterol    Hypertension    Peripheral neuropathy    Schizophrenia, acute ([place])    Seizures ([place])    last seizure May 2016   Thyroid disease     Past Surgical History:  Procedure Laterality Date   ANKLE SURGERY Bilateral 2009   IR RADIOLOGIST EVAL & MGMT  03/27/2021   TUBAL LIGATION  1999     reports that she has been smoking cigarettes. She has been smoking an average of .25 packs per day. She has never used smokeless tobacco. She reports that she does not drink alcohol and does not use drugs.  Allergies  Allergen Reactions   Contrast Media [Iodinated Contrast Media]    Iohexol Itching    Patient started itching after contrast injection, Benadryl was administered and itching stopped.    Family History  Problem Relation Age of Onset   Diabetes Father    Cancer Maternal Grandfather     Prior to Admission medications   Medication Sig Start Date End Date Taking? Authorizing Provider  acetaminophen (TYLENOL) 500 MG tablet Take 1,000 mg by mouth every 6 (six) hours as needed for moderate pain or headache.   Yes [provider]  aspirin 81 MG EC tablet Take 81 mg by mouth daily. 04/10/16  Yes [provider]  DULoxetine (CYMBALTA) 60 MG capsule Take 60 mg by mouth daily.   Yes [provider]  gabapentin (NEURONTIN) 600 MG tablet Take 1 tablet (600 mg total) by mouth 2 (two) times daily. 01/10/15  Yes Luiz Blare Y, DO  insulin aspart (NOVOLOG) 100 UNIT/ML  injection Inject 0-8 Units into the skin 3 (three) times daily before meals. Per sliding scale   Yes [provider]  insulin glargine (LANTUS) 100 UNIT/ML injection Inject 15 Units into the skin daily. Patient taking differently: Inject 20 Units into the skin at bedtime. 06/22/12  Yes Regalado, Belkys A, MD  levothyroxine (SYNTHROID, LEVOTHROID) 125 MCG tablet Take 125 mcg by mouth daily.     Yes [provider]  lisinopril (PRINIVIL,ZESTRIL) 5 MG tablet  Take 5 mg by mouth daily.   Yes [provider]  phenytoin (DILANTIN) 100 MG ER capsule Take 200 mg by mouth 2 (two) times daily.    Yes [provider]  risperiDONE (RISPERDAL) 0.25 MG tablet Take 0.25 mg by mouth 2 (two) times daily.   Yes [provider]  simvastatin (ZOCOR) 20 MG tablet Take 20 mg by mouth every evening.   Yes [provider]  amoxicillin-clavulanate (AUGMENTIN) 875-125 MG tablet Take 1 tablet by mouth every 12 (twelve) hours. Patient not taking: Reported on 09/09/2021 03/05/21   Ocie Cornfield T, PA-C  Diclofenac Sodium CR 100 MG 24 hr tablet Take 1 tablet (100 mg total) by mouth daily. Patient not taking: Reported on 09/09/2021 01/01/21   Palumbo, April, MD  lidocaine (LIDODERM) 5 % Place 1 patch onto the skin daily. Remove & Discard patch within 12 hours or as directed by MD Patient not taking: Reported on 09/09/2021 01/01/21   Randal Buba, April, MD    Physical Exam: Vitals:   09/09/21 1950 09/09/21 2200 09/09/21 2209 09/09/21 2227  BP: 131/89 110/76  121/88  Pulse: (!) 101 85  94  Resp: 20 12    Temp:   (!) 97.5 F (36.4 C) (!) 97.5 F (36.4 C)  TempSrc:   Oral Oral  SpO2: 100% 100%  100%  Weight:      Height:        Physical Exam Vitals reviewed.  Constitutional:      General: She is not in acute distress. HENT:     Head: Normocephalic and atraumatic.  Eyes:     Extraocular Movements: Extraocular movements intact.  Cardiovascular:     Rate and Rhythm: Normal rate and regular rhythm.     Pulses: Normal pulses.  Pulmonary:     Effort: Pulmonary effort is normal. No respiratory distress.     Breath sounds: Normal breath sounds.  Abdominal:     General: Bowel sounds are normal. There is no distension.     Palpations: Abdomen is soft.     Tenderness: There is no abdominal tenderness.  Musculoskeletal:        General: No swelling or tenderness.     Cervical back: Normal range of motion.  Skin:    General: Skin is warm  and dry.  Neurological:     Mental Status: She is alert and oriented to person, place, and time.     Cranial Nerves: No cranial nerve deficit.     Sensory: Sensory deficit present.     Motor: No weakness.     Comments: Diminished sensation to light touch in the right upper extremity > right lower extremity Strength intact in bilateral upper and lower extremities     Labs on Admission: I have personally reviewed following labs and imaging studies  CBC: Recent Labs  Lab 09/09/21 1824 09/09/21 1908  WBC 8.2  --   NEUTROABS 5.9  --   HGB 11.9* 12.2  HCT 35.4* 36.0  MCV 91.0  --  PLT 417*  --    Basic Metabolic Panel: Recent Labs  Lab 09/09/21 1824 09/09/21 1908  NA 131* 131*  K 4.9 4.4  CL 95*  --   CO2 22  --   GLUCOSE 728*  --   BUN 23*  --   CREATININE 0.96  --   CALCIUM 9.6  --    GFR: Estimated Creatinine Clearance: 78.5 mL/min (by C-G formula based on SCr of 0.96 mg/dL). Liver Function Tests: Recent Labs  Lab 09/09/21 1824  AST 19  ALT 22  ALKPHOS 155*  BILITOT 0.7  PROT 8.2*  ALBUMIN 4.2   No results for input(s): LIPASE, AMYLASE in the last 168 hours. No results for input(s): AMMONIA in the last 168 hours. Coagulation Profile: Recent Labs  Lab 09/09/21 1824  INR 1.0   Cardiac Enzymes: No results for input(s): CKTOTAL, CKMB, CKMBINDEX, TROPONINI in the last 168 hours. BNP (last 3 results) No results for input(s): PROBNP in the last 8760 hours. HbA1C: No results for input(s): HGBA1C in the last 72 hours. CBG: Recent Labs  Lab 09/09/21 1959 09/09/21 2051 09/09/21 2131 09/09/21 2235  GLUCAP 555* 468* 323* 176*   Lipid Profile: No results for input(s): CHOL, HDL, LDLCALC, TRIG, CHOLHDL, LDLDIRECT in the last 72 hours. Thyroid Function Tests: No results for input(s): TSH, T4TOTAL, FREET4, T3FREE, THYROIDAB in the last 72 hours. Anemia Panel: No results for input(s): VITAMINB12, FOLATE, FERRITIN, TIBC, IRON, RETICCTPCT in the last 72  hours. Urine analysis:    Component Value Date/Time   COLORURINE STRAW (A) 03/05/2021 1220   APPEARANCEUR CLEAR 03/05/2021 1220   LABSPEC 1.010 03/05/2021 1220   PHURINE 6.0 03/05/2021 1220   GLUCOSEU >=500 (A) 03/05/2021 1220   HGBUR SMALL (A) 03/05/2021 1220   BILIRUBINUR NEGATIVE 03/05/2021 1220   KETONESUR 40 (A) 03/05/2021 1220   PROTEINUR NEGATIVE 03/05/2021 1220   UROBILINOGEN 0.2 09/14/2014 1511   NITRITE NEGATIVE 03/05/2021 1220   LEUKOCYTESUR NEGATIVE 03/05/2021 1220    Radiological Exams on Admission: I have personally reviewed images CT Head Wo Contrast  Result Date: 09/09/2021 CLINICAL DATA:  Dysarthria, right arm and leg numbness/weakness, headaches EXAM: CT HEAD WITHOUT CONTRAST TECHNIQUE: Contiguous axial images were obtained from the base of the skull through the vertex without intravenous contrast. RADIATION DOSE REDUCTION: This exam was performed according to the departmental dose-optimization program which includes automated exposure control, adjustment of the mA and/or kV according to patient size and/or use of iterative reconstruction technique. COMPARISON:  03/07/2021 FINDINGS: Brain: No evidence of acute infarction, hemorrhage, hydrocephalus, extra-axial collection or mass lesion/mass effect. Vascular: No hyperdense vessel or unexpected calcification. Skull: Normal. Negative for fracture or focal lesion. Sinuses/Orbits: The visualized paranasal sinuses are essentially clear. The mastoid air cells are unopacified. Other: None. IMPRESSION: Normal head CT. Electronically Signed   By: Julian Hy M.D.   On: 09/09/2021 17:57    EKG: Independently reviewed.  Sinus tachycardia, PVCs, nonspecific T wave abnormalities.  PVCs new since prior tracing.  Assessment and Plan  Hyperglycemia in the setting of poorly controlled insulin-dependent diabetes Due to insulin nonadherence, patient ran out of Lantus a week ago.  A1c 14.3 in November 2022.  Glucose 728. DKA less  likely as bicarb and anion gap are normal, beta hydroxybutyrate acid only mildly elevated, and blood gas showing normal pH.  Possible HHS but labs were not collected in the ED to check serum osmolarity.  Patient was given IV fluids and placed on insulin drip in the ED.  Upon arrival to [facility], it was noted that her blood glucose was 78.  Insulin drip was stopped and diet initiated.  Patient ate-peanut butter and had orange and cranberry juice.  Afterwards when RN checked her glucose it was noted to be 15,  likely false reading as patient was completely awake and alert, asymptomatic.  She was given 1 amp of D50 and glucose 89 on recheck. -Will hold off starting long-acting insulin at this time.  Advised RN to recheck CBG in an hour, if patient continues to be hypoglycemic, then start dextrose drip.  Otherwise if CBG is stable, then very sensitive sliding scale insulin every 4 hours.  Repeat A1c ordered.  Right-sided numbness and weakness Symptom onset >1.5 weeks ago.  CT head normal.  ED physician at [facility] discussed the case with neurology and patient sent here for MRI brain and MRA head and neck which are currently pending.  I spoke to Dr. Curly Shores on-call for neurology at [facility] who thinks patient's symptoms are likely due to hyperglycemia or possible recrudescence of prior stroke symptoms.  Recommending formal neuro consult only if MRI is positive or if symptoms do not improve despite glycemic control. -MRI brain pending -MRA head and neck pending  Schizophrenia, bipolar disorder -Continue duloxetine and Risperdal  Seizure disorder -Continue phenytoin  Hypothyroidism -Continue Synthroid  Diabetic neuropathy -Continue gabapentin  Hypertension -Hold lisinopril given soft blood pressure  Hyperlipidemia -Continue Zocor  DVT prophylaxis: Lovenox Code Status: Full Code Family Communication: No family available at this time. Level of care: Progressive Care  Unit Admission status: It is my clinical opinion that referral for OBSERVATION is reasonable and necessary in this patient based on the above information provided. The aforementioned taken together are felt to place the patient at high risk for further clinical deterioration. However, it is anticipated that the patient may be medically stable for discharge from the hospital within 24 to 48 hours.   Shela Leff MD [facility]  If 7PM-7AM, please contact night-coverage [email or web address]  09/09/2021, 10:52 PM

## 2021-09-09 NOTE — ED Triage Notes (Addendum)
Pt bib [facility] from [facility] for MRI in relation to right sided numbness. Potassium infusion and insulin infusion (currently 13 units/hr) running with LR bolus, given for hyperglycemia. Last CBG prior to transfer 555.  A&O x4

## 2021-09-09 NOTE — ED Notes (Signed)
Phone handoff report provided to [employer], spoke with [place] RN

## 2021-09-09 NOTE — ED Notes (Signed)
Patient transported to CT 

## 2021-09-09 NOTE — H&P (Incomplete)
History and Physical    Zoe Clay ZOX:[id number] DOB: Jan 27, 1976 DOA: 09/09/2021  PCP: [facility], [facility]  Patient coming from: [facility] ED  Chief Complaint: Right-sided numbness and weakness  HPI: Zoe Clay is a 46 y.o. female with medical history significant of schizophrenia, bipolar disorder, seizures, hypothyroidism, insulin-dependent diabetes, peripheral neuropathy, hypertension, hyperlipidemia presented to [place] ED with right-sided numbness and weakness. CT head normal.  Labs showing WBC 8.2, hemoglobin 11.9, platelet count 417k.  Sodium 131, potassium 4.9, chloride 95, bicarb 22, anion gap 14, BUN 23, creatinine 0.9, glucose 728.  Alkaline phosphatase mildly elevated consistent with prior labs, remainder of LFTs normal.  TSH pending.  Beta hydroxybutyrate acid, UA, and serum osmolarity pending.  Blood gas showing normal pH.  Patient was given IV insulin, IV fluids, and potassium supplement.  ED physician discussed the case with Dr. Cheral Marker with neurology who recommended MRI for further evaluation.  Patient was sent to [facility] ED for MRI brain and MRA head and neck.  Patient reports 1.5-week history of right upper and lower extremity numbness.  Numbness is worse in the right upper extremity and it also feels weak.  No leg weakness, she is able to walk.  Denies history of stroke.  States she normally takes Lantus 20 units at bedtime and NovoLog sliding scale with meals but ran out of Lantus a week ago and was not able to pick it up from the pharmacy due to insurance problems.  She has only been using sliding scale insulin this past week.  Denies fevers, chills, cough, shortness of breath, chest pain, nausea, vomiting, abdominal pain, diarrhea, dysuria, or urinary frequency/urgency.  Review of Systems:  Review of Systems  All other systems reviewed and are negative.  Past Medical History:  Diagnosis Date  . Bipolar 1 disorder ([place])   .  Diabetes mellitus   . High cholesterol   . Hypertension   . Peripheral neuropathy   . Schizophrenia, acute ([place])   . Seizures ([place])    last seizure May 2016  . Thyroid disease     Past Surgical History:  Procedure Laterality Date  . ANKLE SURGERY Bilateral 2009  . IR RADIOLOGIST EVAL & MGMT  03/27/2021  . TUBAL LIGATION  1999     reports that she has been smoking cigarettes. She has been smoking an average of .25 packs per day. She has never used smokeless tobacco. She reports that she does not drink alcohol and does not use drugs.  Allergies  Allergen Reactions  . Contrast Media [Iodinated Contrast Media]   . Iohexol Itching    Patient started itching after contrast injection, Benadryl was administered and itching stopped.    Family History  Problem Relation Age of Onset  . Diabetes Father   . Cancer Maternal Grandfather     Prior to Admission medications   Medication Sig Start Date End Date Taking? Authorizing Provider  acetaminophen (TYLENOL) 500 MG tablet Take 1,000 mg by mouth every 6 (six) hours as needed for moderate pain or headache.   Yes [provider]  aspirin 81 MG EC tablet Take 81 mg by mouth daily. 04/10/16  Yes [provider]  DULoxetine (CYMBALTA) 60 MG capsule Take 60 mg by mouth daily.   Yes [provider]  gabapentin (NEURONTIN) 600 MG tablet Take 1 tablet (600 mg total) by mouth 2 (two) times daily. 01/10/15  Yes Luiz Blare Y, DO  insulin aspart (NOVOLOG) 100 UNIT/ML  injection Inject 0-8 Units into the skin 3 (three) times daily before meals. Per sliding scale   Yes [provider]  insulin glargine (LANTUS) 100 UNIT/ML injection Inject 15 Units into the skin daily. Patient taking differently: Inject 20 Units into the skin at bedtime. 06/22/12  Yes Regalado, Belkys A, MD  levothyroxine (SYNTHROID, LEVOTHROID) 125 MCG tablet Take 125 mcg by mouth daily.     Yes [provider]  lisinopril (PRINIVIL,ZESTRIL)  5 MG tablet Take 5 mg by mouth daily.   Yes [provider]  phenytoin (DILANTIN) 100 MG ER capsule Take 200 mg by mouth 2 (two) times daily.    Yes [provider]  risperiDONE (RISPERDAL) 0.25 MG tablet Take 0.25 mg by mouth 2 (two) times daily.   Yes [provider]  simvastatin (ZOCOR) 20 MG tablet Take 20 mg by mouth every evening.   Yes [provider]  amoxicillin-clavulanate (AUGMENTIN) 875-125 MG tablet Take 1 tablet by mouth every 12 (twelve) hours. Patient not taking: Reported on 09/09/2021 03/05/21   Ocie Cornfield T, PA-C  Diclofenac Sodium CR 100 MG 24 hr tablet Take 1 tablet (100 mg total) by mouth daily. Patient not taking: Reported on 09/09/2021 01/01/21   Palumbo, April, MD  lidocaine (LIDODERM) 5 % Place 1 patch onto the skin daily. Remove & Discard patch within 12 hours or as directed by MD Patient not taking: Reported on 09/09/2021 01/01/21   Randal Buba, April, MD    Physical Exam: Vitals:   09/09/21 1950 09/09/21 2200 09/09/21 2209 09/09/21 2227  BP: 131/89 110/76  121/88  Pulse: (!) 101 85  94  Resp: 20 12    Temp:   (!) 97.5 F (36.4 C) (!) 97.5 F (36.4 C)  TempSrc:   Oral Oral  SpO2: 100% 100%  100%  Weight:      Height:        Physical Exam Vitals reviewed.  Constitutional:      General: She is not in acute distress. HENT:     Head: Normocephalic and atraumatic.  Eyes:     Extraocular Movements: Extraocular movements intact.  Cardiovascular:     Rate and Rhythm: Normal rate and regular rhythm.     Pulses: Normal pulses.  Pulmonary:     Effort: Pulmonary effort is normal. No respiratory distress.     Breath sounds: Normal breath sounds.  Abdominal:     General: Bowel sounds are normal. There is no distension.     Palpations: Abdomen is soft.     Tenderness: There is no abdominal tenderness.  Musculoskeletal:        General: No swelling or tenderness.     Cervical back: Normal range of motion.  Skin:    General:  Skin is warm and dry.  Neurological:     Mental Status: She is alert and oriented to person, place, and time.     Cranial Nerves: No cranial nerve deficit.     Sensory: Sensory deficit present.     Motor: No weakness.     Comments: Diminished sensation to light touch in the right upper extremity > right lower extremity Strength intact in bilateral upper and lower extremities     Labs on Admission: I have personally reviewed following labs and imaging studies  CBC: Recent Labs  Lab 09/09/21 1824 09/09/21 1908  WBC 8.2  --   NEUTROABS 5.9  --   HGB 11.9* 12.2  HCT 35.4* 36.0  MCV 91.0  --  PLT 417*  --    Basic Metabolic Panel: Recent Labs  Lab 09/09/21 1824 09/09/21 1908  NA 131* 131*  K 4.9 4.4  CL 95*  --   CO2 22  --   GLUCOSE 728*  --   BUN 23*  --   CREATININE 0.96  --   CALCIUM 9.6  --    GFR: Estimated Creatinine Clearance: 78.5 mL/min (by C-G formula based on SCr of 0.96 mg/dL). Liver Function Tests: Recent Labs  Lab 09/09/21 1824  AST 19  ALT 22  ALKPHOS 155*  BILITOT 0.7  PROT 8.2*  ALBUMIN 4.2   No results for input(s): LIPASE, AMYLASE in the last 168 hours. No results for input(s): AMMONIA in the last 168 hours. Coagulation Profile: Recent Labs  Lab 09/09/21 1824  INR 1.0   Cardiac Enzymes: No results for input(s): CKTOTAL, CKMB, CKMBINDEX, TROPONINI in the last 168 hours. BNP (last 3 results) No results for input(s): PROBNP in the last 8760 hours. HbA1C: No results for input(s): HGBA1C in the last 72 hours. CBG: Recent Labs  Lab 09/09/21 1959 09/09/21 2051 09/09/21 2131 09/09/21 2235  GLUCAP 555* 468* 323* 176*   Lipid Profile: No results for input(s): CHOL, HDL, LDLCALC, TRIG, CHOLHDL, LDLDIRECT in the last 72 hours. Thyroid Function Tests: No results for input(s): TSH, T4TOTAL, FREET4, T3FREE, THYROIDAB in the last 72 hours. Anemia Panel: No results for input(s): VITAMINB12, FOLATE, FERRITIN, TIBC, IRON, RETICCTPCT in the  last 72 hours. Urine analysis:    Component Value Date/Time   COLORURINE STRAW (A) 03/05/2021 1220   APPEARANCEUR CLEAR 03/05/2021 1220   LABSPEC 1.010 03/05/2021 1220   PHURINE 6.0 03/05/2021 1220   GLUCOSEU >=500 (A) 03/05/2021 1220   HGBUR SMALL (A) 03/05/2021 1220   BILIRUBINUR NEGATIVE 03/05/2021 1220   KETONESUR 40 (A) 03/05/2021 1220   PROTEINUR NEGATIVE 03/05/2021 1220   UROBILINOGEN 0.2 09/14/2014 1511   NITRITE NEGATIVE 03/05/2021 1220   LEUKOCYTESUR NEGATIVE 03/05/2021 1220    Radiological Exams on Admission: I have personally reviewed images CT Head Wo Contrast  Result Date: 09/09/2021 CLINICAL DATA:  Dysarthria, right arm and leg numbness/weakness, headaches EXAM: CT HEAD WITHOUT CONTRAST TECHNIQUE: Contiguous axial images were obtained from the base of the skull through the vertex without intravenous contrast. RADIATION DOSE REDUCTION: This exam was performed according to the departmental dose-optimization program which includes automated exposure control, adjustment of the mA and/or kV according to patient size and/or use of iterative reconstruction technique. COMPARISON:  03/07/2021 FINDINGS: Brain: No evidence of acute infarction, hemorrhage, hydrocephalus, extra-axial collection or mass lesion/mass effect. Vascular: No hyperdense vessel or unexpected calcification. Skull: Normal. Negative for fracture or focal lesion. Sinuses/Orbits: The visualized paranasal sinuses are essentially clear. The mastoid air cells are unopacified. Other: None. IMPRESSION: Normal head CT. Electronically Signed   By: Julian Hy M.D.   On: 09/09/2021 17:57    EKG: Independently reviewed.  Sinus tachycardia, PVCs, nonspecific T wave abnormalities.  PVCs new since prior tracing.  Assessment and Plan  Hyperglycemia in the setting of poorly controlled insulin-dependent diabetes Due to insulin nonadherence, patient ran out of Lantus a week ago.  A1c 14.3 in November 2022.  Glucose 728. DKA  less likely as bicarb and anion gap are normal, and blood gas showing normal pH.  Labs were not collected in the ED to check UA and serum osmolarity.  Beta hydroxybutyrate acid level pending.  A1c 14.3 on 03/07/2021.  Patient was given IV fluids and placed on insulin  drip in the ED. -Will stop insulin drip as glucose has now improved to 170s -Lantus 20 units daily at bedtime -Moderate sliding scale insulin ACHS -Repeat A1c  Right-sided numbness and weakness, headache, dysarthria Symptom onset 1 week ago.  Neuro exam currently***.  CT head normal.  ED physician at [facility] discussed the case with neurology and patient sent here for MRI brain and MRA head and neck which are currently pending.  I spoke to Dr. Curly Shores on-call for neurology at [facility] who thinks patient's symptoms are likely due to hyperglycemia or possible recrudescence of prior stroke symptoms.  Recommending formal neuro consult only if MRI is positive or if symptoms do not improve despite glycemic control. -MRI brain pending -MRA head and neck pending  Schizophrenia Bipolar disorder  Seizures  Hypothyroidism  Peripheral neuropathy  Hypertension  Hyperlipidemia     DVT prophylaxis: {Blank single:19197::"Lovenox","SQ Heparin","IV heparin gtt","Xarelto","Eliquis","Coumadin","SCDs","***"} Code Status: {Blank single:19197::"Full Code","DNR","DNR/DNI","Comfort Care","***"} Family Communication: ***  Consults called: ***  Disposition Plan: Status is: Observation {Observation:23811}   Level of care: {Blank single:19197::"Med-Surg","Telemetry bed","Progressive Care Unit","Step Down Unit"} Admission status: ***  Shela Leff MD [facility]  If 7PM-7AM, please contact night-coverage [email or web address]  09/09/2021, 10:52 PM

## 2021-09-10 ENCOUNTER — Other Ambulatory Visit (HOSPITAL_COMMUNITY): Payer: Self-pay

## 2021-09-10 ENCOUNTER — Observation Stay (HOSPITAL_COMMUNITY): Payer: Commercial Managed Care - HMO

## 2021-09-10 DIAGNOSIS — R739 Hyperglycemia, unspecified: Secondary | ICD-10-CM

## 2021-09-10 DIAGNOSIS — I10 Essential (primary) hypertension: Secondary | ICD-10-CM

## 2021-09-10 DIAGNOSIS — E785 Hyperlipidemia, unspecified: Secondary | ICD-10-CM

## 2021-09-10 LAB — BASIC METABOLIC PANEL
Anion gap: 8 (ref 5–15)
Anion gap: 8 (ref 5–15)
BUN: 14 mg/dL (ref 6–20)
BUN: 12 mg/dL (ref 6–20)
CO2: 22 mmol/L (ref 22–32)
CO2: 26 mmol/L (ref 22–32)
Calcium: 8.8 mg/dL — ABNORMAL LOW (ref 8.9–10.3)
Calcium: 9 mg/dL (ref 8.9–10.3)
Chloride: 105 mmol/L (ref 98–111)
Chloride: 103 mmol/L (ref 98–111)
Creatinine, Ser: 0.64 mg/dL (ref 0.44–1.00)
Creatinine, Ser: 0.61 mg/dL (ref 0.44–1.00)
GFR, Estimated: >60 mL/min (ref >60)
GFR, Estimated: >60 mL/min (ref >60)
Glucose, Bld: 135 mg/dL — ABNORMAL HIGH (ref 70–99)
Glucose, Bld: 154 mg/dL — ABNORMAL HIGH (ref 70–99)
Potassium: 3.9 mmol/L (ref 3.5–5.1)
Potassium: 4.8 mmol/L (ref 3.5–5.1)
Sodium: 135 mmol/L (ref 135–145)
Sodium: 137 mmol/L (ref 135–145)

## 2021-09-10 LAB — GLUCOSE, CAPILLARY
Glucose-Capillary: 105 mg/dL — ABNORMAL HIGH (ref 70–99)
Glucose-Capillary: 119 mg/dL — ABNORMAL HIGH (ref 70–99)
Glucose-Capillary: 119 mg/dL — ABNORMAL HIGH (ref 70–99)
Glucose-Capillary: 15 mg/dL — CL (ref 70–99)
Glucose-Capillary: 89 mg/dL (ref 70–99)

## 2021-09-10 LAB — URINALYSIS, ROUTINE W REFLEX MICROSCOPIC
Bilirubin Urine: NEGATIVE
Glucose, UA: >=500 mg/dL — AB
Ketones, ur: 5 mg/dL — AB
Leukocytes,Ua: NEGATIVE
Nitrite: NEGATIVE
Protein, ur: NEGATIVE mg/dL
Specific Gravity, Urine: 1.013 (ref 1.005–1.030)
pH: 5 (ref 5.0–8.0)

## 2021-09-10 LAB — HEMOGLOBIN A1C
Hgb A1c MFr Bld: 10.7 % — ABNORMAL HIGH (ref 4.8–5.6)
Mean Plasma Glucose: 260.39 mg/dL

## 2021-09-10 LAB — HIV ANTIBODY (ROUTINE TESTING W REFLEX): HIV Screen 4th Generation wRfx: NONREACTIVE

## 2021-09-10 LAB — PREGNANCY, URINE: Preg Test, Ur: NEGATIVE

## 2021-09-10 IMAGING — MR MR HEAD WO/W CM
7 of 13 series · 21 of 48 positions shown · IV contrast (gadavist)
Comparison: None Available.

CLINICAL DATA: Neuro deficit, acute, stroke suspected; right arm
and right leg numbness and weakness. Slurred speech. Carotid bruits
on exam.

EXAM:
MRI HEAD WITHOUT AND WITH CONTRAST
MRA HEAD WITHOUT CONTRAST
MRA NECK WITHOUT AND WITH CONTRAST
TECHNIQUE: Multiplanar, multiecho pulse sequences of the brain and surrounding
structures were obtained without and with intravenous contrast.
Angiographic images of the Circle of Willis were obtained using MRA
technique without intravenous contrast. Angiographic images of the
neck were obtained using MRA technique without and with intravenous
contrast. Carotid stenosis measurements (when applicable) are
obtained utilizing NASCET criteria, using the distal internal
carotid diameter as the denominator.
CONTRAST:  7mL GADAVIST GADOBUTROL 1 MMOL/ML IV SOLN

[Series 2: DWI · axial · 3.0mm · 0.94mm/px · z∈[-85,+35]mm · 6 of 94 slices shown (1 of 2)]
[im 1/94]
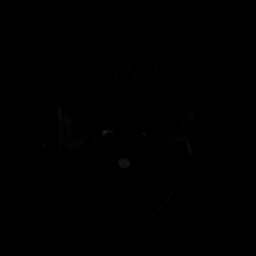
[im 19/94]
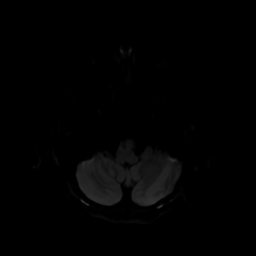
[im 38/94]
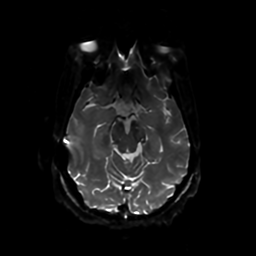
[im 56/94]
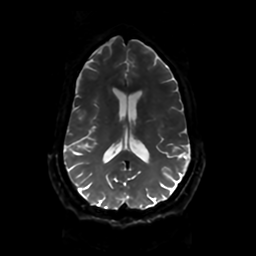
[im 75/94]
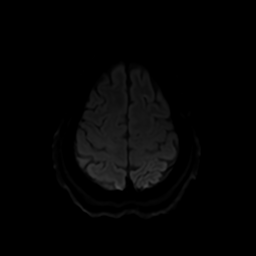
[im 94/94]
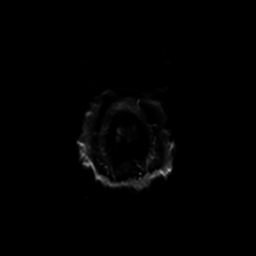

[Series 4: DWI · coronal · 4.0mm · 0.94mm/px · 5 of 70 slices shown (2 of 2)]
[im 1/70]
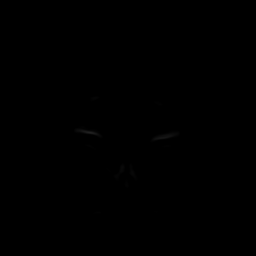
[im 18/70]
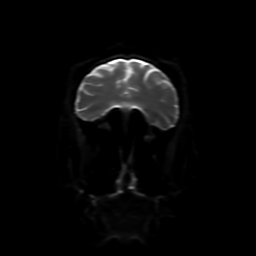
[im 35/70]
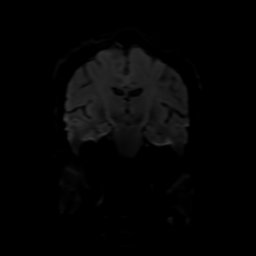
[im 52/70]
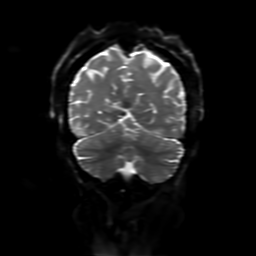
[im 70/70]
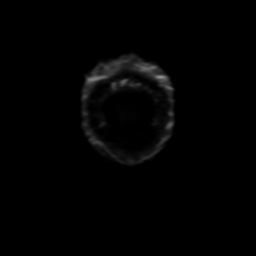

[Series 5: FLAIR · sagittal · 5.0mm · 0.23mm/px · 1 of 23 slices shown (1 of 2)]
[im 1/23]
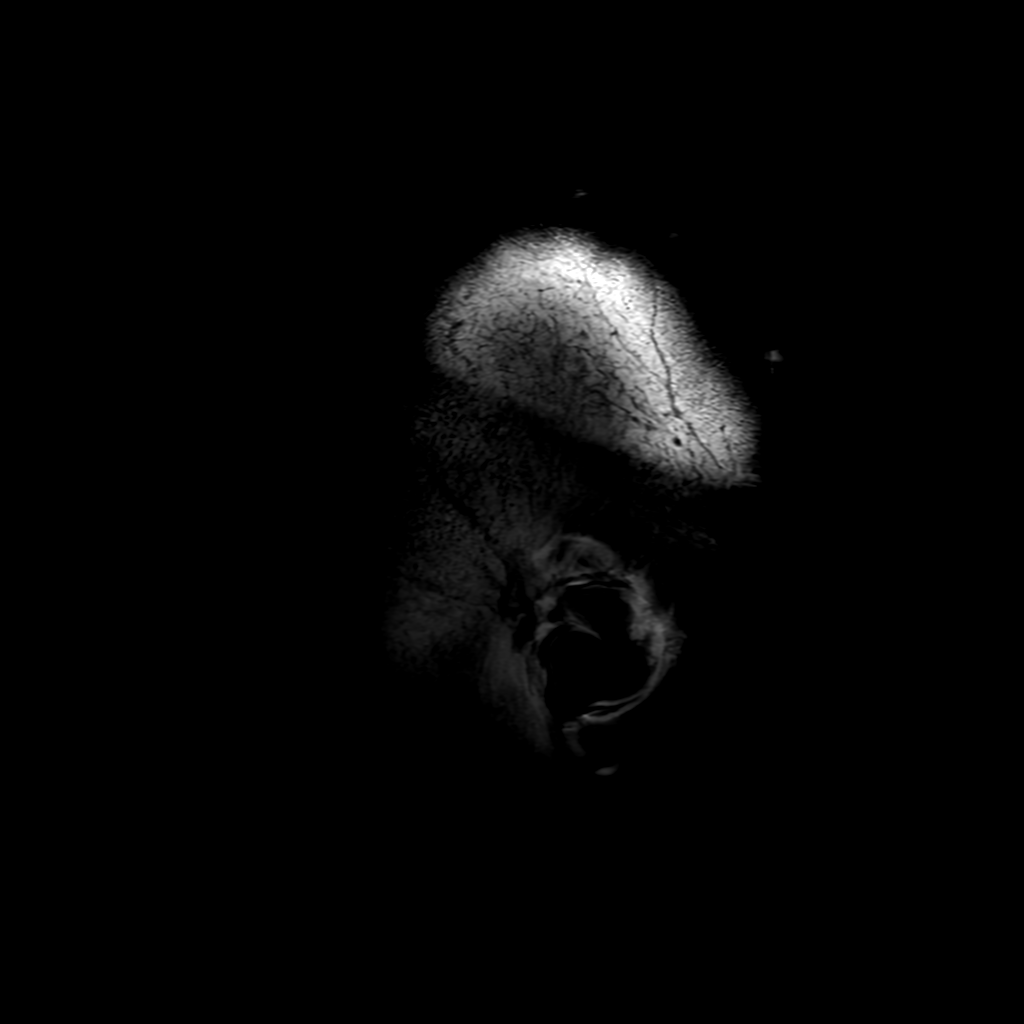

[Series 6: T2 · axial · 5.0mm · 0.23mm/px · z∈[-83,+39]mm · 2 of 24 slices shown]
[im 1/24]
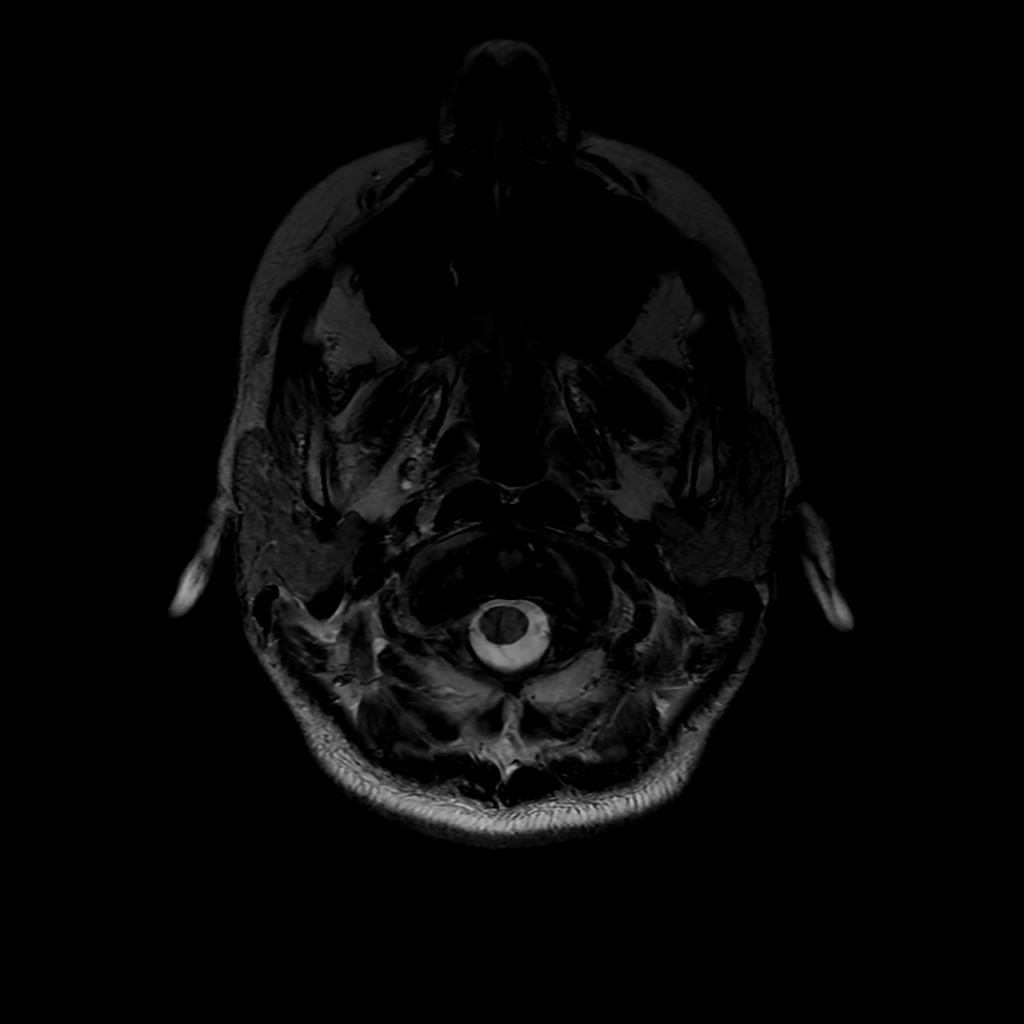
[im 24/24]
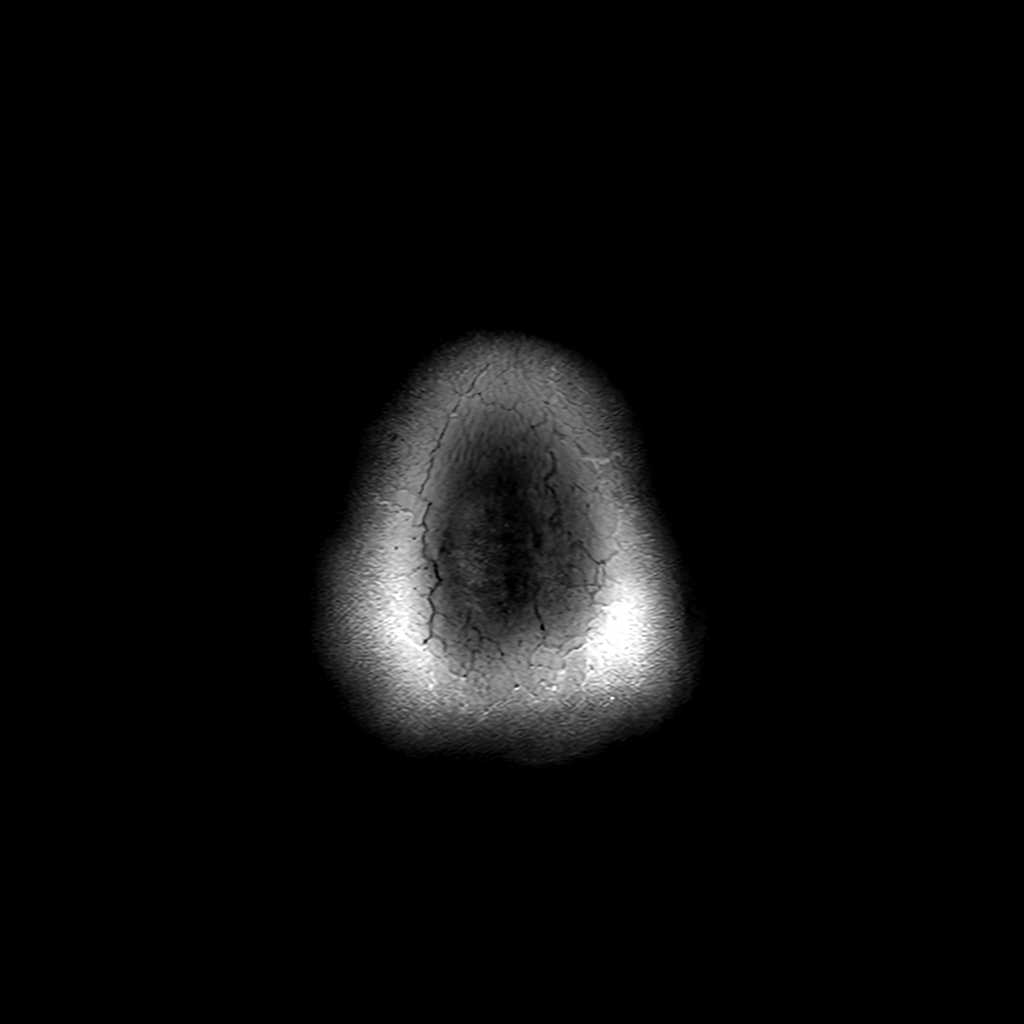

[Series 7: FLAIR · axial · 4.0mm · 0.45mm/px · z∈[-80,+38]mm · 2 of 32 slices shown (2 of 2)]
[im 1/32]
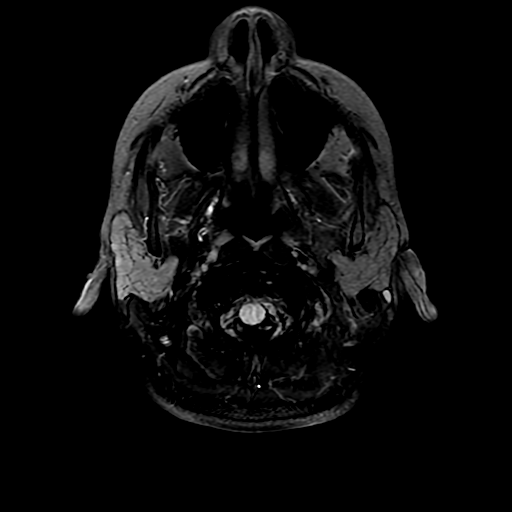
[im 32/32]
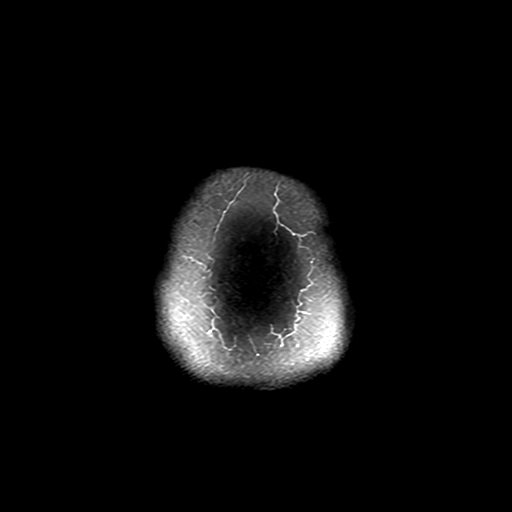

[Series 250: ADC · axial · 3.0mm · 0.94mm/px · z∈[-85,+35]mm · 3 of 47 slices shown (1 of 2)]
[im 1/47]
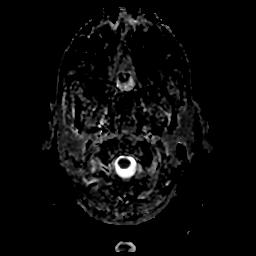
[im 24/47]
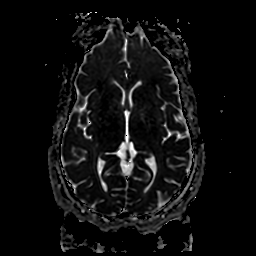
[im 47/47]
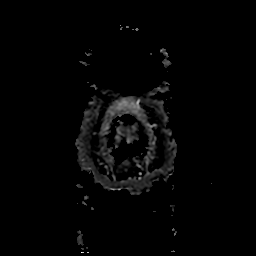

[Series 450: ADC · coronal · 4.0mm · 0.94mm/px · 2 of 34 slices shown (2 of 2)]
[im 1/34]
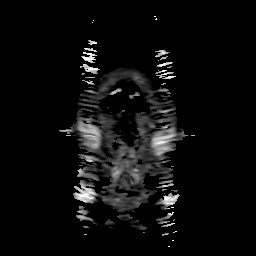
[im 34/34]
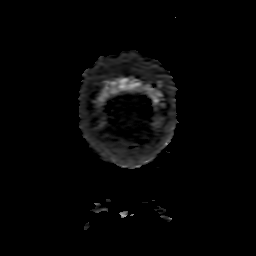

[21 of 48 positions shown; findings below may reference images not displayed]

FINDINGS: MRI HEAD

Brain: There is no acute infarction or intracranial hemorrhage.
There is no intracranial mass, mass effect, or edema. There is no
hydrocephalus or extra-axial fluid collection. Ventricles and sulci
are normal in size and configuration.

Vascular: Major vessel flow voids at the skull base are preserved.

Skull and upper cervical spine: Normal marrow signal is preserved.

Sinuses/Orbits: Minor mucosal thickening.  Orbits are unremarkable.

Other: Sella is unremarkable.  Mastoid air cells are clear.

MRA HEAD

Intracranial internal carotid arteries are patent. Middle and
anterior cerebral arteries are patent. Intracranial vertebral
arteries, basilar artery, posterior cerebral arteries are patent.
Left posterior communicating artery is present. There is no
significant stenosis or aneurysm.

MRA NECK

Great vessel origins are patent. Common, internal, and external
carotid arteries are patent. Vertebral arteries are patent. The left
vertebral is dominant. There is no hemodynamically significant
stenosis or evidence of dissection.
IMPRESSION: No evidence of recent infarction, hemorrhage, or mass.

No large vessel occlusion, hemodynamically significant stenosis, or
evidence of dissection.

## 2021-09-10 IMAGING — MR MR MRA NECK WO/W CM
11 of 19 series · 16 of 48 positions shown · IV contrast (gadavist)
Comparison: None Available.

CLINICAL DATA: Neuro deficit, acute, stroke suspected; right arm
and right leg numbness and weakness. Slurred speech. Carotid bruits
on exam.

EXAM:
MRI HEAD WITHOUT AND WITH CONTRAST
MRA HEAD WITHOUT CONTRAST
MRA NECK WITHOUT AND WITH CONTRAST
TECHNIQUE: Multiplanar, multiecho pulse sequences of the brain and surrounding
structures were obtained without and with intravenous contrast.
Angiographic images of the Circle of Willis were obtained using MRA
technique without intravenous contrast. Angiographic images of the
neck were obtained using MRA technique without and with intravenous
contrast. Carotid stenosis measurements (when applicable) are
obtained utilizing NASCET criteria, using the distal internal
carotid diameter as the denominator.
CONTRAST:  7mL GADAVIST GADOBUTROL 1 MMOL/ML IV SOLN

[Series 2: DWI · axial · 3.0mm · 0.94mm/px · z∈[-85,+35]mm · 2 of 94 slices shown (1 of 2)]
[im 1/94]
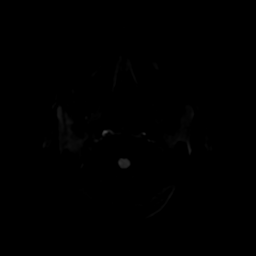
[im 94/94]
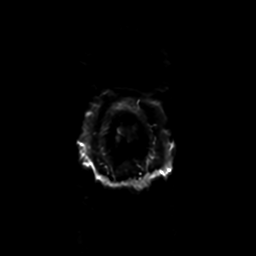

[Series 3: ax (id) · axial · 1.0mm · 0.43mm/px · z∈[-75,-37]mm · 3 of 176 slices shown]
[im 1/176]
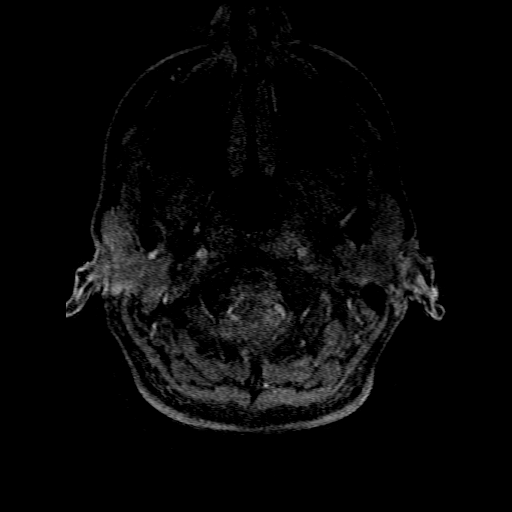
[im 44/176]
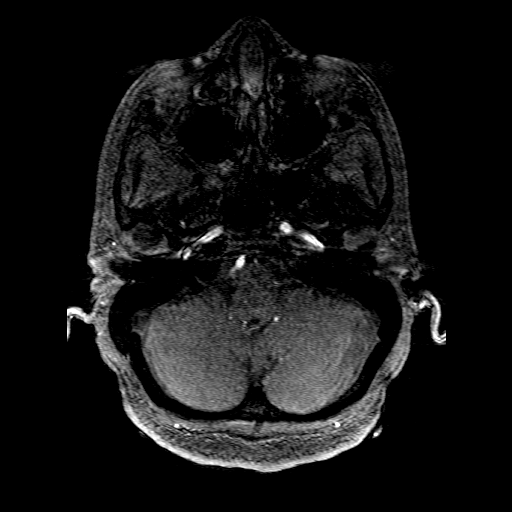
[im 88/176]
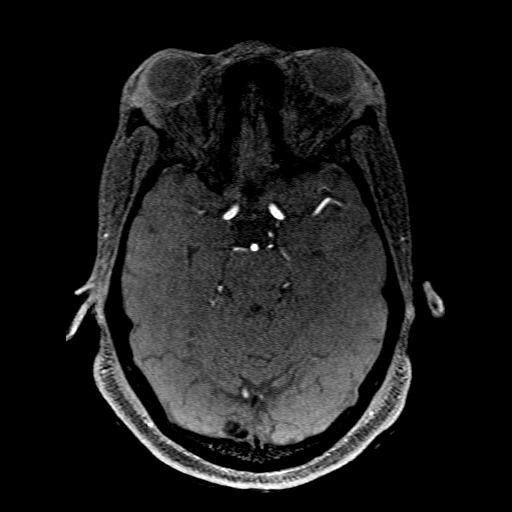

[Series 4: DWI · coronal · 4.0mm · 0.94mm/px · 1 of 70 slices shown (2 of 2)]
[im 1/70]
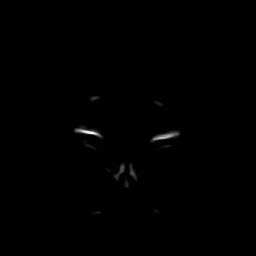

[Series 5: FLAIR · sagittal · 5.0mm · 0.23mm/px · 1 of 23 slices shown (1 of 2)]
[im 1/23]
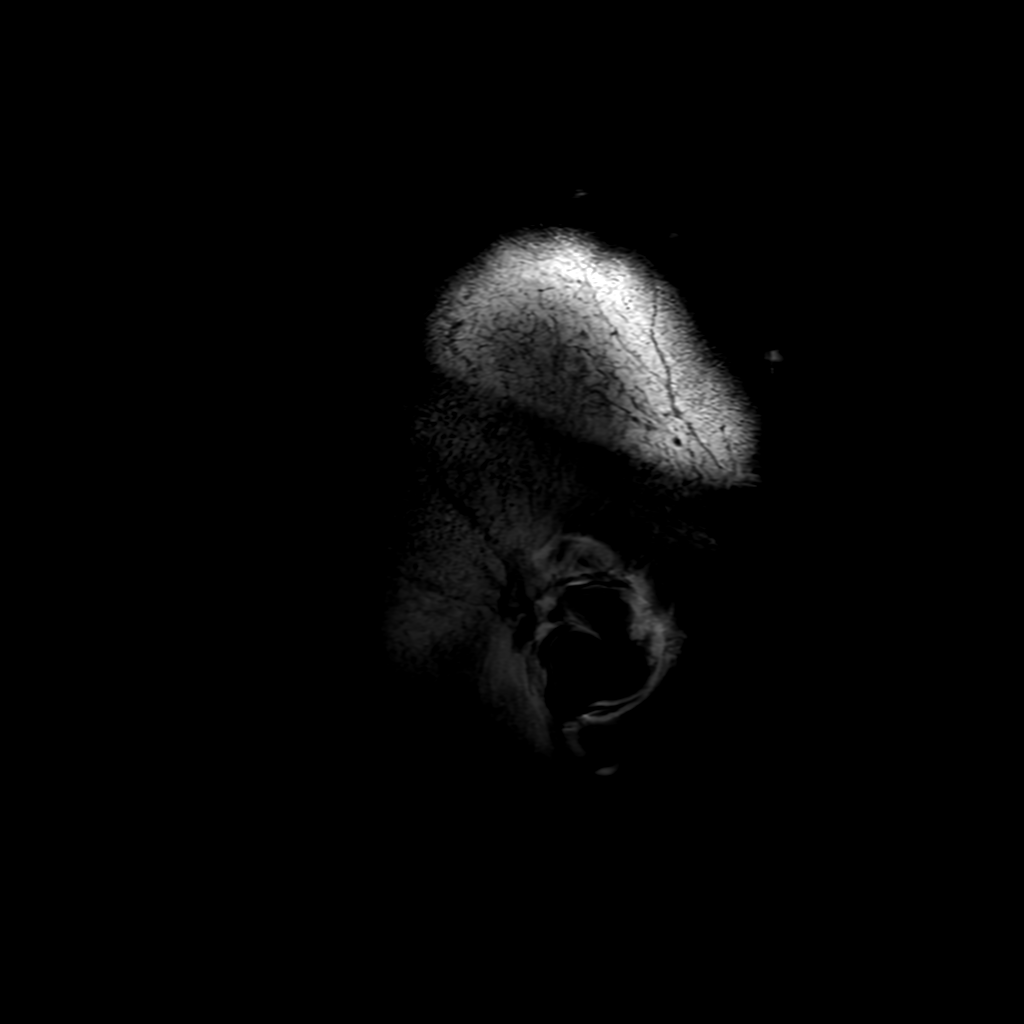

[Series 6: T2 · axial · 5.0mm · 0.23mm/px · 1 of 24 slices shown]
[im 1/24]
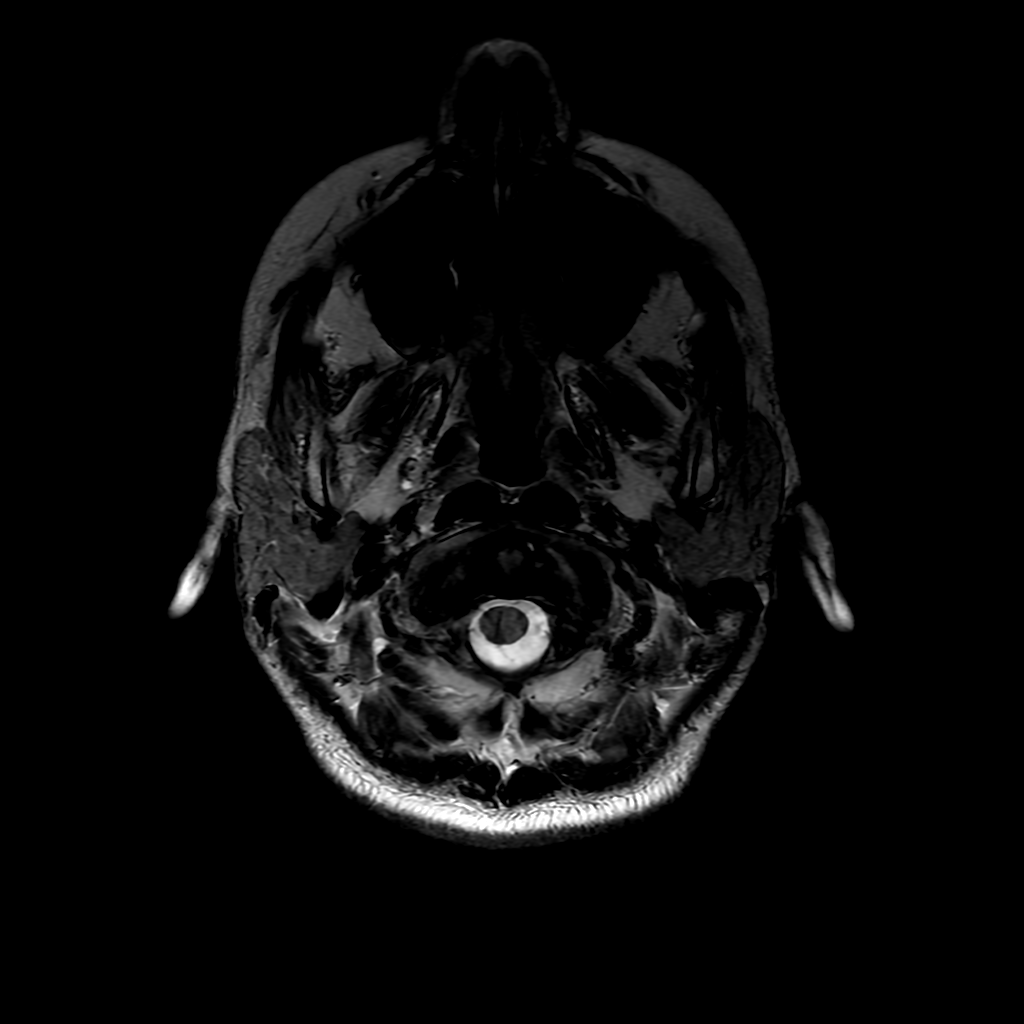

[Series 7: FLAIR · axial · 4.0mm · 0.45mm/px · 1 of 32 slices shown (2 of 2)]
[im 1/32]
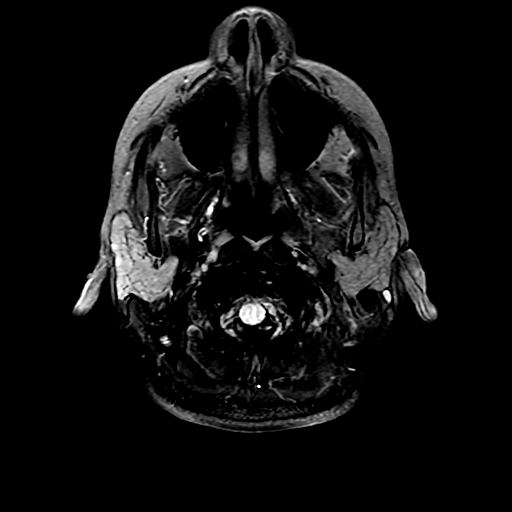

[Series 13: T2 post-contrast · coronal · 5.0mm · 0.20mm/px · 1 of 28 slices shown]
[im 1/28]
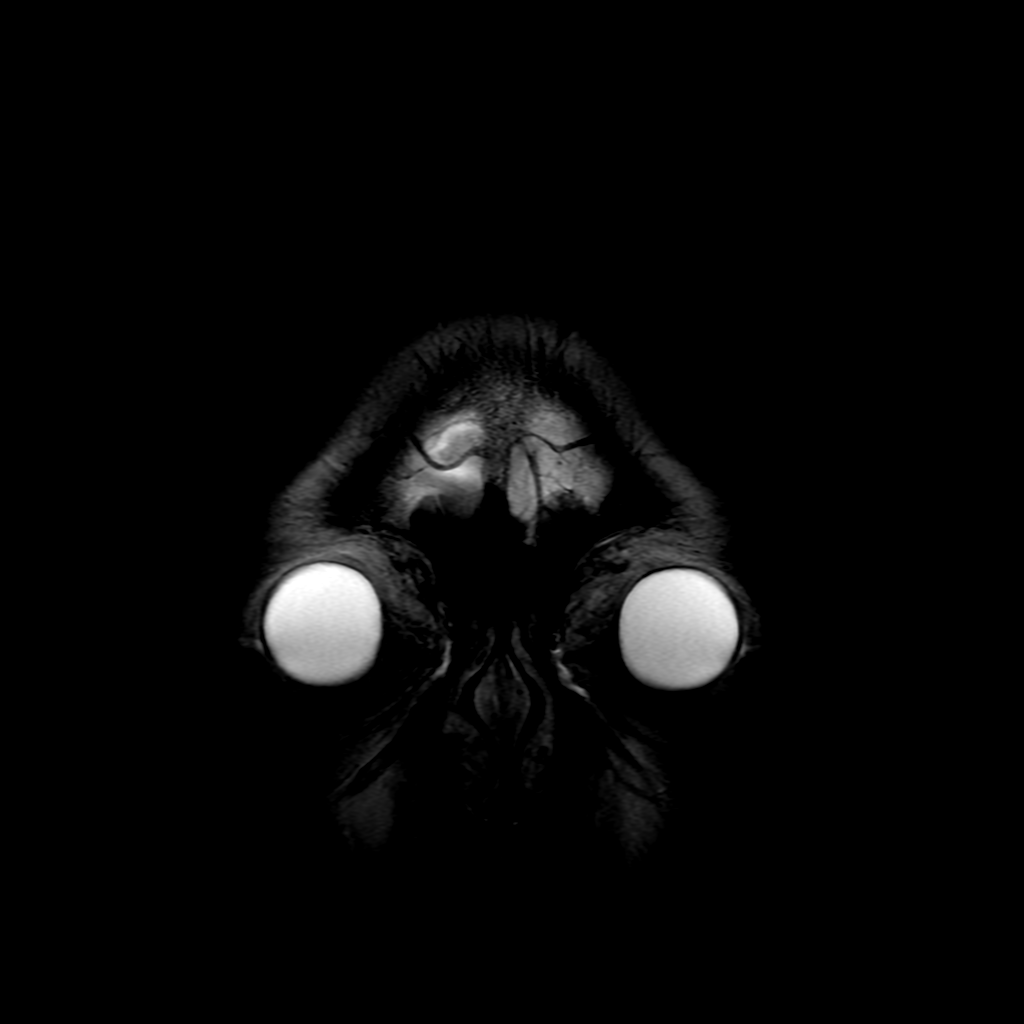

[Series 14: T1 · axial · 3.0mm · 0.94mm/px · z∈[-85,+38]mm · 2 of 48 slices shown (1 of 2)]
[im 1/48]
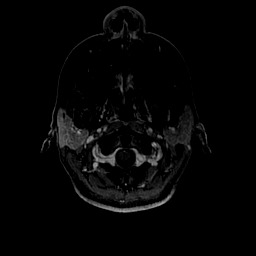
[im 48/48]
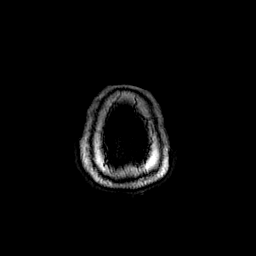

[Series 15: T1 · coronal · 5.0mm · 0.39mm/px · 1 of 28 slices shown (2 of 2)]
[im 1/28]
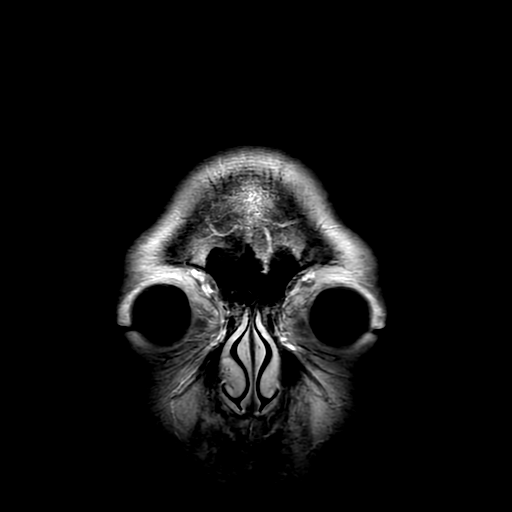

[Series 250: ADC · axial · 3.0mm · 0.94mm/px · z∈[-85,+35]mm · 2 of 47 slices shown (1 of 2)]
[im 1/47]
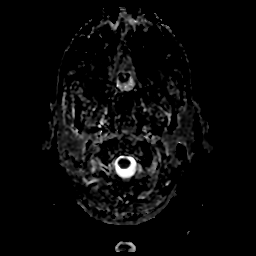
[im 47/47]
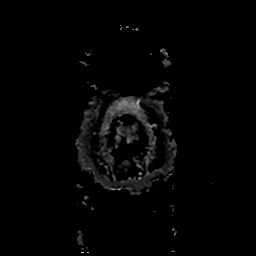

[Series 450: ADC · coronal · 4.0mm · 0.94mm/px · 1 of 34 slices shown (2 of 2)]
[im 1/34]
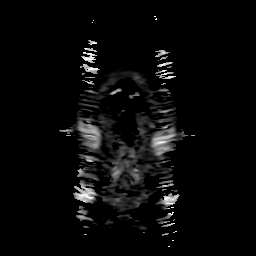

[16 of 48 positions shown; findings below may reference images not displayed]

FINDINGS: MRI HEAD

Brain: There is no acute infarction or intracranial hemorrhage.
There is no intracranial mass, mass effect, or edema. There is no
hydrocephalus or extra-axial fluid collection. Ventricles and sulci
are normal in size and configuration.

Vascular: Major vessel flow voids at the skull base are preserved.

Skull and upper cervical spine: Normal marrow signal is preserved.

Sinuses/Orbits: Minor mucosal thickening.  Orbits are unremarkable.

Other: Sella is unremarkable.  Mastoid air cells are clear.

MRA HEAD

Intracranial internal carotid arteries are patent. Middle and
anterior cerebral arteries are patent. Intracranial vertebral
arteries, basilar artery, posterior cerebral arteries are patent.
Left posterior communicating artery is present. There is no
significant stenosis or aneurysm.

MRA NECK

Great vessel origins are patent. Common, internal, and external
carotid arteries are patent. Vertebral arteries are patent. The left
vertebral is dominant. There is no hemodynamically significant
stenosis or evidence of dissection.
IMPRESSION: No evidence of recent infarction, hemorrhage, or mass.

No large vessel occlusion, hemodynamically significant stenosis, or
evidence of dissection.

## 2021-09-10 MED ORDER — DULOXETINE HCL 60 MG PO CPEP
60.0000 mg | ORAL_CAPSULE | Freq: Every day | ORAL | Status: DC
  Administered 2021-09-10: 60 mg via ORAL
  Filled 2021-09-10: qty 1

## 2021-09-10 MED ORDER — INSULIN ASPART 100 UNIT/ML IJ SOLN: 0.0000 [IU] | INTRAMUSCULAR | Status: DC

## 2021-09-10 MED ORDER — ACETAMINOPHEN 650 MG RE SUPP: 650.0000 mg | Freq: Four times a day (QID) | RECTAL | Status: DC | PRN

## 2021-09-10 MED ORDER — INSULIN LISPRO (1 UNIT DIAL) 100 UNIT/ML (KWIKPEN)
0.0000 [IU] | PEN_INJECTOR | Freq: Three times a day (TID) | SUBCUTANEOUS | 11 refills | Status: AC
  Filled 2021-09-10: qty 6, 25d supply, fill #0

## 2021-09-10 MED ORDER — PHENYTOIN SODIUM EXTENDED 100 MG PO CAPS
200.0000 mg | ORAL_CAPSULE | Freq: Two times a day (BID) | ORAL | Status: DC
  Administered 2021-09-10 (×2): 200 mg via ORAL
  Filled 2021-09-10 (×2): qty 2

## 2021-09-10 MED ORDER — INSULIN PEN NEEDLE 32G X 4 MM MISC
11 refills | Status: AC
  Filled 2021-09-10: qty 100, fill #0

## 2021-09-10 MED ORDER — INSULIN GLARGINE 100 UNIT/ML SOLOSTAR PEN
20.0000 [IU] | PEN_INJECTOR | Freq: Every day | SUBCUTANEOUS | 0 refills | Status: AC
  Filled 2021-09-10: qty 6, 30d supply, fill #0

## 2021-09-10 MED ORDER — ENOXAPARIN SODIUM 40 MG/0.4ML IJ SOSY
40.0000 mg | PREFILLED_SYRINGE | INTRAMUSCULAR | Status: DC
  Administered 2021-09-10: 40 mg via SUBCUTANEOUS
  Filled 2021-09-10: qty 0.4

## 2021-09-10 MED ORDER — GABAPENTIN 600 MG PO TABS
600.0000 mg | ORAL_TABLET | Freq: Two times a day (BID) | ORAL | Status: DC
  Administered 2021-09-10 (×2): 600 mg via ORAL
  Filled 2021-09-10 (×2): qty 1

## 2021-09-10 MED ORDER — LISINOPRIL 2.5 MG PO TABS
5.0000 mg | ORAL_TABLET | Freq: Every day | ORAL | Status: DC
Start: 2021-09-10 — End: 2021-09-10

## 2021-09-10 MED ORDER — GADOBUTROL 1 MMOL/ML IV SOLN
7.0000 mL | Freq: Once | INTRAVENOUS | Status: AC | PRN
  Administered 2021-09-10: 7 mL via INTRAVENOUS

## 2021-09-10 MED ORDER — INSULIN PEN NEEDLE 32G X 4 MM MISC
11 refills | Status: DC
  Filled 2021-09-10: qty 100, 25d supply, fill #0

## 2021-09-10 MED ORDER — INSULIN ASPART 100 UNIT/ML ~~LOC~~ SOLN: 0.0000 [IU] | Freq: Three times a day (TID) | SUBCUTANEOUS | 11 refills | Status: DC

## 2021-09-10 MED ORDER — INSULIN GLARGINE 100 UNIT/ML ~~LOC~~ SOLN: 20.0000 [IU] | Freq: Every day | SUBCUTANEOUS | 0 refills | Status: DC

## 2021-09-10 MED ORDER — RISPERIDONE 0.5 MG PO TABS
0.2500 mg | ORAL_TABLET | Freq: Two times a day (BID) | ORAL | Status: DC
  Administered 2021-09-10 (×2): 0.25 mg via ORAL
  Filled 2021-09-10 (×2): qty 1

## 2021-09-10 MED ORDER — SIMVASTATIN 20 MG PO TABS: 20.0000 mg | ORAL_TABLET | Freq: Every evening | ORAL | Status: DC

## 2021-09-10 MED ORDER — LEVOTHYROXINE SODIUM 25 MCG PO TABS
125.0000 ug | ORAL_TABLET | Freq: Every day | ORAL | Status: DC
  Administered 2021-09-10: 125 ug via ORAL
  Filled 2021-09-10: qty 1

## 2021-09-10 MED ORDER — ACETAMINOPHEN 325 MG PO TABS: 650.0000 mg | ORAL_TABLET | Freq: Four times a day (QID) | ORAL | Status: DC | PRN

## 2021-09-10 NOTE — Plan of Care (Signed)
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
## 2021-09-10 NOTE — TOC Transition Note (Signed)
Transition of Care [facility]) - CM/SW Discharge Note   Patient Details  Name: Zoe Clay MRN: [id number] Date of Birth: Aug 09, 1975  Transition of [facility]) CM/SW Contact:  Angelita Ingles, RN Phone Number:[phone]  09/10/2021, 12:01 PM   Clinical Narrative:    MATCH completed to provide uninsured patient with 30 day supply of medications. Medication has been delivered to patient         Patient Goals and CMS Choice        Discharge Placement                       Discharge Plan and Services                                     Social Determinants of Health (SDOH) Interventions     Readmission Risk Interventions     View : No data to display.

## 2021-11-09 ENCOUNTER — Emergency Department (HOSPITAL_BASED_OUTPATIENT_CLINIC_OR_DEPARTMENT_OTHER): Payer: Commercial Managed Care - HMO

## 2021-11-09 ENCOUNTER — Emergency Department (HOSPITAL_BASED_OUTPATIENT_CLINIC_OR_DEPARTMENT_OTHER)
Admission: EM | Admit: 2021-11-09 | Discharge: 2021-11-09 | Disposition: A | Discharge status: Home / Self Care | Payer: Commercial Managed Care - HMO | Attending: Emergency Medicine | Admitting: Emergency Medicine

## 2021-11-09 ENCOUNTER — Other Ambulatory Visit: Payer: Self-pay

## 2021-11-09 ENCOUNTER — Encounter (HOSPITAL_BASED_OUTPATIENT_CLINIC_OR_DEPARTMENT_OTHER): Payer: Self-pay | Admitting: Emergency Medicine

## 2021-11-09 DIAGNOSIS — I10 Essential (primary) hypertension: Secondary | ICD-10-CM | POA: Diagnosis not present

## 2021-11-09 DIAGNOSIS — M62838 Other muscle spasm: Secondary | ICD-10-CM | POA: Diagnosis not present

## 2021-11-09 DIAGNOSIS — Z794 Long term (current) use of insulin: Secondary | ICD-10-CM | POA: Insufficient documentation

## 2021-11-09 DIAGNOSIS — Z7982 Long term (current) use of aspirin: Secondary | ICD-10-CM | POA: Insufficient documentation

## 2021-11-09 DIAGNOSIS — Z20822 Contact with and (suspected) exposure to covid-19: Secondary | ICD-10-CM | POA: Diagnosis not present

## 2021-11-09 DIAGNOSIS — Z79899 Other long term (current) drug therapy: Secondary | ICD-10-CM | POA: Insufficient documentation

## 2021-11-09 DIAGNOSIS — R946 Abnormal results of thyroid function studies: Secondary | ICD-10-CM | POA: Insufficient documentation

## 2021-11-09 DIAGNOSIS — E1165 Type 2 diabetes mellitus with hyperglycemia: Secondary | ICD-10-CM

## 2021-11-09 DIAGNOSIS — R7989 Other specified abnormal findings of blood chemistry: Secondary | ICD-10-CM

## 2021-11-09 DIAGNOSIS — R739 Hyperglycemia, unspecified: Secondary | ICD-10-CM

## 2021-11-09 LAB — TSH: TSH: 14.878 u[IU]/mL — ABNORMAL HIGH (ref 0.350–4.500)

## 2021-11-09 LAB — CBC WITH DIFFERENTIAL/PLATELET
Abs Immature Granulocytes: 0.01 10*3/uL (ref 0.00–0.07)
Basophils Absolute: 0 10*3/uL (ref 0.0–0.1)
Basophils Relative: 1 %
Eosinophils Absolute: 0.1 10*3/uL (ref 0.0–0.5)
Eosinophils Relative: 1 %
HCT: 32.6 % — ABNORMAL LOW (ref 36.0–46.0)
Hemoglobin: 11 g/dL — ABNORMAL LOW (ref 12.0–15.0)
Immature Granulocytes: 0 %
Lymphocytes Relative: 43 %
Lymphs Abs: 2.7 10*3/uL (ref 0.7–4.0)
MCH: 30.6 pg (ref 26.0–34.0)
MCHC: 33.7 g/dL (ref 30.0–36.0)
MCV: 90.6 fL (ref 80.0–100.0)
Monocytes Absolute: 0.3 10*3/uL (ref 0.1–1.0)
Monocytes Relative: 5 %
Neutro Abs: 3.1 10*3/uL (ref 1.7–7.7)
Neutrophils Relative %: 50 %
Platelets: 295 10*3/uL (ref 150–400)
RBC: 3.6 MIL/uL — ABNORMAL LOW (ref 3.87–5.11)
RDW: 14.4 % (ref 11.5–15.5)
WBC: 6.1 10*3/uL (ref 4.0–10.5)
nRBC: 0 % (ref 0.0–0.2)

## 2021-11-09 LAB — CBG MONITORING, ED
Glucose-Capillary: >600 mg/dL (ref 70–99)
Glucose-Capillary: 599 mg/dL (ref 70–99)
Glucose-Capillary: 433 mg/dL — ABNORMAL HIGH (ref 70–99)

## 2021-11-09 LAB — LIPASE, BLOOD: Lipase: 20 U/L (ref 11–51)

## 2021-11-09 LAB — URINALYSIS, ROUTINE W REFLEX MICROSCOPIC
Bilirubin Urine: NEGATIVE
Glucose, UA: >=500 mg/dL — AB
Ketones, ur: 15 mg/dL — AB
Leukocytes,Ua: NEGATIVE
Nitrite: NEGATIVE
Protein, ur: NEGATIVE mg/dL
Specific Gravity, Urine: <=1.005 (ref 1.005–1.030)
pH: 5.5 (ref 5.0–8.0)

## 2021-11-09 LAB — I-STAT VENOUS BLOOD GAS, ED
Acid-base deficit: 1 mmol/L (ref 0.0–2.0)
Bicarbonate: 25.2 mmol/L (ref 20.0–28.0)
Calcium, Ion: 1.13 mmol/L — ABNORMAL LOW (ref 1.15–1.40)
HCT: 37 % (ref 36.0–46.0)
Hemoglobin: 12.6 g/dL (ref 12.0–15.0)
O2 Saturation: 64 %
Patient temperature: 98.6
Potassium: 3.8 mmol/L (ref 3.5–5.1)
Sodium: 131 mmol/L — ABNORMAL LOW (ref 135–145)
TCO2: 27 mmol/L (ref 22–32)
pCO2, Ven: 47.2 mmHg (ref 44–60)
pH, Ven: 7.335 (ref 7.25–7.43)
pO2, Ven: 36 mmHg (ref 32–45)

## 2021-11-09 LAB — BETA-HYDROXYBUTYRIC ACID: Beta-Hydroxybutyric Acid: 2.24 mmol/L — ABNORMAL HIGH (ref 0.05–0.27)

## 2021-11-09 LAB — COMPREHENSIVE METABOLIC PANEL
ALT: 27 U/L (ref 0–44)
AST: 21 U/L (ref 15–41)
Albumin: 3.8 g/dL (ref 3.5–5.0)
Alkaline Phosphatase: 114 U/L (ref 38–126)
Anion gap: 11 (ref 5–15)
BUN: 17 mg/dL (ref 6–20)
CO2: 24 mmol/L (ref 22–32)
Calcium: 8.3 mg/dL — ABNORMAL LOW (ref 8.9–10.3)
Chloride: 94 mmol/L — ABNORMAL LOW (ref 98–111)
Creatinine, Ser: 0.94 mg/dL (ref 0.44–1.00)
GFR, Estimated: >60 mL/min (ref >60)
Glucose, Bld: 762 mg/dL (ref 70–99)
Potassium: 3.6 mmol/L (ref 3.5–5.1)
Sodium: 129 mmol/L — ABNORMAL LOW (ref 135–145)
Total Bilirubin: 0.8 mg/dL (ref 0.3–1.2)
Total Protein: 7.3 g/dL (ref 6.5–8.1)

## 2021-11-09 LAB — OSMOLALITY: Osmolality: 316 mOsm/kg — ABNORMAL HIGH (ref 275–295)

## 2021-11-09 LAB — PHOSPHORUS: Phosphorus: 3.4 mg/dL (ref 2.5–4.6)

## 2021-11-09 LAB — HCG, SERUM, QUALITATIVE: Preg, Serum: NEGATIVE

## 2021-11-09 LAB — URINALYSIS, MICROSCOPIC (REFLEX)

## 2021-11-09 LAB — MAGNESIUM: Magnesium: 1.5 mg/dL — ABNORMAL LOW (ref 1.7–2.4)

## 2021-11-09 LAB — RESP PANEL BY RT-PCR (FLU A&B, COVID) ARPGX2
Influenza A by PCR: NEGATIVE
Influenza B by PCR: NEGATIVE
SARS Coronavirus 2 by RT PCR: NEGATIVE

## 2021-11-09 MED ORDER — POTASSIUM CHLORIDE CRYS ER 20 MEQ PO TBCR: 20.0000 meq | EXTENDED_RELEASE_TABLET | Freq: Two times a day (BID) | ORAL | 0 refills | Status: AC

## 2021-11-09 MED ORDER — POTASSIUM CHLORIDE CRYS ER 20 MEQ PO TBCR: 40.0000 meq | EXTENDED_RELEASE_TABLET | Freq: Once | ORAL | Status: DC

## 2021-11-09 MED ORDER — INSULIN ASPART 100 UNIT/ML IJ SOLN
10.0000 [IU] | Freq: Once | INTRAMUSCULAR | Status: AC
  Administered 2021-11-09: 10 [IU] via SUBCUTANEOUS

## 2021-11-09 MED ORDER — DIAZEPAM 5 MG PO TABS
5.0000 mg | ORAL_TABLET | Freq: Once | ORAL | Status: AC
  Administered 2021-11-09: 5 mg via ORAL
  Filled 2021-11-09: qty 1

## 2021-11-09 MED ORDER — LACTATED RINGERS IV BOLUS
1000.0000 mL | Freq: Once | INTRAVENOUS | Status: AC
Start: 2021-11-09 — End: 2021-11-09
  Administered 2021-11-09: 1000 mL via INTRAVENOUS

## 2021-11-09 MED ORDER — MAGNESIUM SULFATE 2 GM/50ML IV SOLN
2.0000 g | Freq: Once | INTRAVENOUS | Status: AC
Start: 2021-11-09 — End: 2021-11-09
  Administered 2021-11-09: 2 g via INTRAVENOUS
  Filled 2021-11-09: qty 50

## 2021-11-09 MED ORDER — LACTATED RINGERS IV BOLUS
1000.0000 mL | Freq: Once | INTRAVENOUS | Status: AC
  Administered 2021-11-09: 1000 mL via INTRAVENOUS

## 2021-11-09 MED ORDER — METHOCARBAMOL 500 MG PO TABS: 1000.0000 mg | ORAL_TABLET | Freq: Two times a day (BID) | ORAL | 0 refills | Status: AC | PRN

## 2021-11-09 NOTE — ED Notes (Signed)
Pt used the restroom before being asked for a urine sample, will ask for a urine sample later.

## 2021-11-09 NOTE — Discharge Instructions (Addendum)
It was a pleasure caring for you today in the emergency department.  Please return to the emergency department for any worsening or worrisome symptoms.  Please follow-up with your PCP regarding poorly controlled diabetes and thyroid abnormalities   Please follow-up with neurology regarding your muscle spasms

## 2021-11-09 NOTE — ED Notes (Signed)
Reviewed AVS/discharge instruction with patient. Time allotted for and all questions answered. Patient is agreeable for d/c and escorted to ed exit by staff.  

## 2021-11-09 NOTE — ED Triage Notes (Signed)
Patient reports involuntary movement of right arm throughout the day, states she has some pain that radiates up into her neck, all of her symptoms started at 8 am this morning.

## 2021-11-09 NOTE — ED Provider Notes (Signed)
[facility] DEPARTMENT Provider Note   CSN: [id number] Arrival date & time: 11/09/21  1857     History  Chief Complaint  Patient presents with  . Spasms    Zoe Clay is a 46 y.o. female.  Patient as above with significant medical history as below, including bipolar 1 disorder, insulin-dependent diabetes, peripheral neuropathy, schizophrenia, seizures, thyroid disease who presents to the ED with complaint of high blood sugar, right arm spasm.  Patient was admitted 5/29 secondary to right arm numbness.  She was discharged 5/30.  She had MRI and MRA at that time which was unremarkable.  She was discharged with 45-monthsupply of insulin.  Patient reports over the past few days she has had increased thirst, blood sugar has been high the past few days, glucose at home measuring over 300.  She reports she has been compliant with her insulin.  History of noncompliance in the past.  She is also been having spasms to her primarily right upper extremity over the past 3 months.  She is scheduled to see a specialist in the future but is unsure when the appointment is.  She was started on gabapentin.  She thinks her spasms have mildly worsened over the past week.  No weakness or numbness.  No falls.  No trauma.  No fevers or chills.  No chest pain or dyspnea.  No nausea or vomiting.  No change to bowel function, does report polyuria in the past few days.  Mildly increased thirst from baseline.  No sick contacts or recent travel.   Past Medical History:  Diagnosis Date  . Bipolar 1 disorder ([place]   . Diabetes mellitus   . High cholesterol   . Hypertension   . Peripheral neuropathy   . Schizophrenia, acute ([place]   . Seizures ([place]    last seizure May 2016  . Thyroid disease     Past Surgical History:  Procedure Laterality Date  . ANKLE SURGERY Bilateral 2009  . IR RADIOLOGIST EVAL & MGMT  03/27/2021  . TUBAL LIGATION  1999     The history is provided by the  patient. No language interpreter was used.  Extremity Weakness Pertinent negatives include no chest pain, no abdominal pain, no headaches and no shortness of breath.       Home Medications Prior to Admission medications   Medication Sig Start Date End Date Taking? Authorizing Provider  methocarbamol (ROBAXIN) 500 MG tablet Take 2 tablets (1,000 mg total) by mouth 2 (two) times daily as needed for up to 7 days for muscle spasms. 11/09/21 11/16/21 Yes GWynona DoveA, DO  potassium chloride SA (KLOR-CON M) 20 MEQ tablet Take 1 tablet (20 mEq total) by mouth 2 (two) times daily for 3 days. 11/09/21 11/12/21 Yes GJeanell Sparrow DO  acetaminophen (TYLENOL) 500 MG tablet Take 1,000 mg by mouth every 6 (six) hours as needed for moderate pain or headache.    [provider]  aspirin 81 MG EC tablet Take 81 mg by mouth daily. 04/10/16   [provider]  Diclofenac Sodium CR 100 MG 24 hr tablet Take 1 tablet (100 mg total) by mouth daily. Patient not taking: Reported on 09/09/2021 01/01/21   Palumbo, April, MD  DULoxetine (CYMBALTA) 60 MG capsule Take 60 mg by mouth daily.    [provider]  gabapentin (NEURONTIN) 600 MG tablet Take 1 tablet (600 mg total) by mouth 2 (two) times daily. 01/10/15   PKatheren Shams DO  insulin lispro (HUMALOG) 100 UNIT/ML KwikPen Inject 0-8 Units into the skin 3 (three) times daily before meals. Use sliding scale. 09/10/21   Nita Sells, MD  insulin glargine (LANTUS) 100 UNIT/ML Solostar Pen Inject 20 Units into the skin at bedtime. 09/10/21   Nita Sells, MD  Insulin Pen Needle 32G X 4 MM MISC Use to inject insulin 4 times daily as directed. 09/10/21   Nita Sells, MD  levothyroxine (SYNTHROID, LEVOTHROID) 125 MCG tablet Take 125 mcg by mouth daily.      [provider]  lidocaine (LIDODERM) 5 % Place 1 patch onto the skin daily. Remove & Discard patch within 12 hours or as directed by MD Patient not taking: Reported on  09/09/2021 01/01/21   Palumbo, April, MD  lisinopril (PRINIVIL,ZESTRIL) 5 MG tablet Take 5 mg by mouth daily.    [provider]  phenytoin (DILANTIN) 100 MG ER capsule Take 200 mg by mouth 2 (two) times daily.     [provider]  risperiDONE (RISPERDAL) 0.25 MG tablet Take 0.25 mg by mouth 2 (two) times daily.    [provider]  simvastatin (ZOCOR) 20 MG tablet Take 20 mg by mouth every evening.    [provider]  insulin aspart (NOVOLOG) 100 UNIT/ML injection Inject 0-8 Units into the skin 3 (three) times daily before meals. Per sliding scale 09/10/21 09/10/21  Nita Sells, MD      Allergies    Contrast media [iodinated contrast media] and Iohexol    Review of Systems   Review of Systems  Constitutional:  Negative for activity change and fever.  HENT:  Negative for facial swelling and trouble swallowing.   Eyes:  Negative for discharge and redness.  Respiratory:  Negative for cough and shortness of breath.   Cardiovascular:  Negative for chest pain and palpitations.  Gastrointestinal:  Negative for abdominal pain and nausea.  Endocrine: Positive for polydipsia and polyuria.  Genitourinary:  Negative for dysuria and flank pain.  Musculoskeletal:  Positive for extremity weakness. Negative for back pain and gait problem.  Skin:  Negative for pallor and rash.  Neurological:  Negative for syncope and headaches.  Psychiatric/Behavioral:  The patient is nervous/anxious.     Physical Exam Updated Vital Signs BP (!) 147/76   Pulse 88   Temp 98.6 F (37 C) (Oral)   Resp (!) 23   Ht '5\' 5"'$  (1.651 m)   Wt 72.6 kg   SpO2 97%   BMI 26.63 kg/m  Physical Exam Vitals and nursing note reviewed.  Constitutional:      General: She is not in acute distress.    Appearance: Normal appearance. She is not ill-appearing, toxic-appearing or diaphoretic.  HENT:     Head: Normocephalic and atraumatic.     Right Ear: External ear normal.     Left Ear:  External ear normal.     Nose: Nose normal.     Mouth/Throat:     Mouth: Mucous membranes are moist.  Eyes:     General: No scleral icterus.       Right eye: No discharge.        Left eye: No discharge.     Extraocular Movements: Extraocular movements intact.     Pupils: Pupils are equal, round, and reactive to light.  Cardiovascular:     Rate and Rhythm: Normal rate and regular rhythm.     Pulses: Normal pulses.          Radial pulses are 2+ on  the right side and 2+ on the left side.       Dorsalis pedis pulses are 2+ on the right side and 2+ on the left side.     Heart sounds: Normal heart sounds.  Pulmonary:     Effort: Pulmonary effort is normal. No respiratory distress.     Breath sounds: Normal breath sounds.  Abdominal:     General: Abdomen is flat.     Tenderness: There is no abdominal tenderness.  Musculoskeletal:        General: Normal range of motion.     Cervical back: Normal range of motion.     Right lower leg: No edema.     Left lower leg: No edema.  Skin:    General: Skin is warm and dry.     Capillary Refill: Capillary refill takes less than 2 seconds.  Neurological:     General: No focal deficit present.     Mental Status: She is alert and oriented to person, place, and time.     GCS: GCS eye subscore is 4. GCS verbal subscore is 5. GCS motor subscore is 6.     Cranial Nerves: Cranial nerves 2-12 are intact. No dysarthria.     Sensory: Sensation is intact.     Motor: Motor function is intact. No tremor.     Coordination: Coordination is intact. Coordination normal.     Gait: Gait is intact.  Psychiatric:        Mood and Affect: Mood normal.        Behavior: Behavior normal.     ED Results / Procedures / Treatments   Labs (all labs ordered are listed, but only abnormal results are displayed) Labs Reviewed  CBC WITH DIFFERENTIAL/PLATELET - Abnormal; Notable for the following components:      Result Value   RBC 3.60 (*)    Hemoglobin 11.0 (*)    HCT  32.6 (*)    All other components within normal limits  COMPREHENSIVE METABOLIC PANEL - Abnormal; Notable for the following components:   Sodium 129 (*)    Chloride 94 (*)    Glucose, Bld 762 (*)    Calcium 8.3 (*)    All other components within normal limits  URINALYSIS, ROUTINE W REFLEX MICROSCOPIC - Abnormal; Notable for the following components:   Glucose, UA >=500 (*)    Hgb urine dipstick TRACE (*)    Ketones, ur 15 (*)    All other components within normal limits  OSMOLALITY - Abnormal; Notable for the following components:   Osmolality 316 (*)    All other components within normal limits  BETA-HYDROXYBUTYRIC ACID - Abnormal; Notable for the following components:   Beta-Hydroxybutyric Acid 2.24 (*)    All other components within normal limits  MAGNESIUM - Abnormal; Notable for the following components:   Magnesium 1.5 (*)    All other components within normal limits  TSH - Abnormal; Notable for the following components:   TSH 14.878 (*)    All other components within normal limits  URINALYSIS, MICROSCOPIC (REFLEX) - Abnormal; Notable for the following components:   Bacteria, UA RARE (*)    All other components within normal limits  CBG MONITORING, ED - Abnormal; Notable for the following components:   Glucose-Capillary >600 (*)    All other components within normal limits  I-STAT VENOUS BLOOD GAS, ED - Abnormal; Notable for the following components:   Sodium 131 (*)    Calcium, Ion 1.13 (*)  All other components within normal limits  CBG MONITORING, ED - Abnormal; Notable for the following components:   Glucose-Capillary 599 (*)    All other components within normal limits  CBG MONITORING, ED - Abnormal; Notable for the following components:   Glucose-Capillary 433 (*)    All other components within normal limits  RESP PANEL BY RT-PCR (FLU A&B, COVID) ARPGX2  LIPASE, BLOOD  HCG, SERUM, QUALITATIVE  PHOSPHORUS  BLOOD GAS, VENOUS  PHENYTOIN LEVEL, FREE AND TOTAL     EKG EKG Interpretation  Date/Time:  Saturday November 09 2021 19:39:14 EDT Ventricular Rate:  97 PR Interval:  143 QRS Duration: 86 QT Interval:  373 QTC Calculation: 474 R Axis:   51 Text Interpretation: Sinus rhythm Probable left atrial enlargement Probable anteroseptal infarct, old Similar to prior, no STEMI Confirmed by Wynona Dove ([id number]) on 11/09/2021 11:20:28 PM  Radiology DG Chest Portable 1 View  Result Date: 11/09/2021 CLINICAL DATA:  Pain EXAM: PORTABLE CHEST 1 VIEW COMPARISON:  Chest x-ray 03/07/2021 FINDINGS: The heart size and mediastinal contours are within normal limits. Both lungs are clear. The visualized skeletal structures are unremarkable. IMPRESSION: No active disease. Electronically Signed   By: Ronney Asters M.D.   On: 11/09/2021 21:19    Procedures Ultrasound ED Peripheral IV (Provider)  Date/Time: 11/09/2021 8:31 PM  Performed by: Jeanell Sparrow, DO Authorized by: Jeanell Sparrow, DO   Procedure details:    Indications: multiple failed IV attempts and poor IV access     Skin Prep: chlorhexidine gluconate     Location:  Left AC   Angiocath:  20 G   Bedside Ultrasound Guided: Yes     Images: not archived     Patient tolerated procedure without complications: Yes     Dressing applied: Yes       Medications Ordered in ED Medications  potassium chloride SA (KLOR-CON M) CR tablet 40 mEq (has no administration in time range)  lactated ringers bolus 1,000 mL (0 mLs Intravenous Stopped 11/09/21 2054)  diazepam (VALIUM) tablet 5 mg (5 mg Oral Given 11/09/21 2054)  insulin aspart (novoLOG) injection 10 Units (10 Units Subcutaneous Given 11/09/21 2142)  lactated ringers bolus 1,000 mL (0 mLs Intravenous Stopped 11/09/21 2246)  magnesium sulfate IVPB 2 g 50 mL (2 g Intravenous New Bag/Given 11/09/21 2146)    ED Course/ Medical Decision Making/ A&P Clinical Course as of 11/09/21 2327  Sat Nov 09, 2021  2139 Anion gap: 11 [SG]  2139 Glucose(!!): 762 [SG]  2139  Ketones, ur(!): 15 [SG]  2139 pH, Ven: 7.335 [SG]  2139 CO2: 24 pH normal, bicarb is normal, she has ketonuria and elevated glucose; less likely is DKA or HHS, favor hyperglycemia; will give insulin, continue IVF. Also give valium for muscle spasm with MgSO4 [SG]  2327 Hemoglobin(!): 11.0 Similar to baseline.  [SG]    Clinical Course User Index [SG] Jeanell Sparrow, DO                           Medical Decision Making Amount and/or Complexity of Data Reviewed Labs: ordered. Decision-making details documented in ED Course. Radiology: ordered. ECG/medicine tests: ordered.  Risk Prescription drug management.    CC: spasm, high glucose  This patient presents to the Emergency Department for the above complaint. This involves an extensive number of treatment options and is a complaint that carries with it a high risk of complications and morbidity. Vital signs were reviewed.  Serious etiologies considered.  Ddx includes but not limited to DKA, HHS, hyperglycemic crisis, electrolyte derangement, metabolic, endocrine, psychogenic, other acute etiologies considered  Record review:  Previous records obtained and reviewed prior admission, prior labs/imaging, home meds  Additional history obtained from [place] and surgical history as noted above.   Work up as above, notable for:  Labs & imaging results that were available during my care of the patient were visualized by me and considered in my medical decision making.  Physical exam as above.   I ordered imaging studies which included chest x-ray. I visualized the imaging, interpreted images, and I agree with radiologist interpretation.  No acute abnormalities  Cardiac monitoring reviewed and interpreted personally which shows NSR   Labs reviewed, she is hyperglycemic without evidence of DKA or HHS. Potassium is normal however insulin was given so will replace potassium p.o. TSH is elevated, similar to prior.  Advise she  follow-up with PCP regarding Synthroid dosing, no evidence at this time for myxedema coma or thyroid storm.  Management: IVF, insulin, magnesium replacement > Oral potassium replacement ordered  ED Course: Clinical Course as of 11/09/21 2327  Sat Nov 09, 2021  2139 Anion gap: 11 [SG]  2139 Glucose(!!): 762 [SG]  2139 Ketones, ur(!): 15 [SG]  2139 pH, Ven: 7.335 [SG]  2139 CO2: 24 pH normal, bicarb is normal, she has ketonuria and elevated glucose; less likely is DKA or HHS, favor hyperglycemia; will give insulin, continue IVF. Also give valium for muscle spasm with MgSO4 [SG]  2327 Hemoglobin(!): 11.0 Similar to baseline.  [SG]    Clinical Course User Index [SG] Wynona Dove A, DO     Reassessment:  Symptoms improving, no longer having spasm  Admission was considered.   Patient's glucose has improved after insulin.  I stressed importance with the patient of adhering to her home insulin regimen and strict monitoring of her glucose.  Does have a history of variable compliance with her medication regimen.  She is agreeable to close monitoring of her blood sugar at home and reports she will take her insulin as prescribed; does not need refill.  She will follow-up with her PCP regarding poorly managed diabetes.  Advised her to follow-up with her neurologist regarding muscle spasms, she has follow-up appointment in place  She is not psychotic. She is able to contract for her safety.   The patient improved significantly and was discharged in stable condition. Detailed discussions were had with the patient regarding current findings, and need for close f/u with PCP or on call doctor. The patient has been instructed to return immediately if the symptoms worsen in any way for re-evaluation. Patient verbalized understanding and is in agreement with current care plan. All questions answered prior to discharge.              Social determinants of health include -  Counseled patient  for approximately 3 minutes regarding smoking cessation. Discussed risks of smoking and how they applied and affected their visit here today. Patient not ready to quit at this time, however will follow up with their primary doctor when they are.   CPT code: [phone]: intermediate counseling for smoking cessation   Social History   Socioeconomic History  . Marital status: Single    Spouse name: Not on file  . Number of children: Not on file  . Years of education: Not on file  . Highest education level: Not on file  Occupational History  . Not on file  Tobacco Use  . Smoking status: Every Day    Packs/day: 0.25    Types: Cigarettes  . Smokeless tobacco: Never  Vaping Use  . Vaping Use: Never used  Substance and Sexual Activity  . Alcohol use: No  . Drug use: No  . Sexual activity: Not on file  Other Topics Concern  . Not on file  Social History Narrative  . Not on file   Social Determinants of Health   Financial Resource Strain: Not on file  Food Insecurity: Not on file  Transportation Needs: Not on file  Physical Activity: Not on file  Stress: Not on file  Social Connections: Not on file  Intimate Partner Violence: Not on file      This chart was dictated using voice recognition software.  Despite best efforts to proofread,  errors can occur which can change the documentation meaning.         Final Clinical Impression(s) / ED Diagnoses Final diagnoses:  Poorly controlled diabetes mellitus ([place])  Hyperglycemia  Muscle spasm  Elevated TSH    Rx / DC Orders ED Discharge Orders          Ordered    methocarbamol (ROBAXIN) 500 MG tablet  2 times daily PRN        11/09/21 2316    potassium chloride SA (KLOR-CON M) 20 MEQ tablet  2 times daily        11/09/21 2321              Jeanell Sparrow, DO 11/09/21 2326

## 2021-11-12 LAB — PHENYTOIN LEVEL, FREE AND TOTAL
Phenytoin, Free: NOT DETECTED ug/mL (ref 1.0–2.0)
Phenytoin, Total: <0.8 ug/mL — ABNORMAL LOW (ref 10.0–20.0)

## 2021-11-19 ENCOUNTER — Emergency Department (HOSPITAL_BASED_OUTPATIENT_CLINIC_OR_DEPARTMENT_OTHER)
Admission: EM | Admit: 2021-11-19 | Discharge: 2021-11-19 | Disposition: A | Discharge status: Home / Self Care | Payer: Commercial Managed Care - HMO | Attending: Emergency Medicine | Admitting: Emergency Medicine

## 2021-11-19 ENCOUNTER — Encounter (HOSPITAL_BASED_OUTPATIENT_CLINIC_OR_DEPARTMENT_OTHER): Payer: Self-pay | Admitting: Pediatrics

## 2021-11-19 ENCOUNTER — Other Ambulatory Visit: Payer: Self-pay

## 2021-11-19 DIAGNOSIS — Z79899 Other long term (current) drug therapy: Secondary | ICD-10-CM | POA: Insufficient documentation

## 2021-11-19 DIAGNOSIS — F17200 Nicotine dependence, unspecified, uncomplicated: Secondary | ICD-10-CM | POA: Insufficient documentation

## 2021-11-19 DIAGNOSIS — E162 Hypoglycemia, unspecified: Secondary | ICD-10-CM

## 2021-11-19 DIAGNOSIS — R252 Cramp and spasm: Secondary | ICD-10-CM | POA: Diagnosis present

## 2021-11-19 DIAGNOSIS — I10 Essential (primary) hypertension: Secondary | ICD-10-CM | POA: Insufficient documentation

## 2021-11-19 DIAGNOSIS — Z7984 Long term (current) use of oral hypoglycemic drugs: Secondary | ICD-10-CM | POA: Insufficient documentation

## 2021-11-19 DIAGNOSIS — Z7982 Long term (current) use of aspirin: Secondary | ICD-10-CM | POA: Diagnosis not present

## 2021-11-19 DIAGNOSIS — E11649 Type 2 diabetes mellitus with hypoglycemia without coma: Secondary | ICD-10-CM | POA: Diagnosis not present

## 2021-11-19 LAB — CBC WITH DIFFERENTIAL/PLATELET
Abs Immature Granulocytes: 0.01 10*3/uL (ref 0.00–0.07)
Basophils Absolute: 0 10*3/uL (ref 0.0–0.1)
Basophils Relative: 1 %
Eosinophils Absolute: 0.1 10*3/uL (ref 0.0–0.5)
Eosinophils Relative: 1 %
HCT: 38.3 % (ref 36.0–46.0)
Hemoglobin: 12.8 g/dL (ref 12.0–15.0)
Immature Granulocytes: 0 %
Lymphocytes Relative: 56 %
Lymphs Abs: 4.5 10*3/uL — ABNORMAL HIGH (ref 0.7–4.0)
MCH: 30.4 pg (ref 26.0–34.0)
MCHC: 33.4 g/dL (ref 30.0–36.0)
MCV: 91 fL (ref 80.0–100.0)
Monocytes Absolute: 0.4 10*3/uL (ref 0.1–1.0)
Monocytes Relative: 5 %
Neutro Abs: 2.9 10*3/uL (ref 1.7–7.7)
Neutrophils Relative %: 37 %
Platelets: 438 10*3/uL — ABNORMAL HIGH (ref 150–400)
RBC: 4.21 MIL/uL (ref 3.87–5.11)
RDW: 15.1 % (ref 11.5–15.5)
WBC: 7.9 10*3/uL (ref 4.0–10.5)
nRBC: 0 % (ref 0.0–0.2)

## 2021-11-19 LAB — BASIC METABOLIC PANEL
Anion gap: 8 (ref 5–15)
BUN: 13 mg/dL (ref 6–20)
CO2: 26 mmol/L (ref 22–32)
Calcium: 8.9 mg/dL (ref 8.9–10.3)
Chloride: 103 mmol/L (ref 98–111)
Creatinine, Ser: 0.63 mg/dL (ref 0.44–1.00)
GFR, Estimated: >60 mL/min (ref >60)
Glucose, Bld: 68 mg/dL — ABNORMAL LOW (ref 70–99)
Potassium: 3.7 mmol/L (ref 3.5–5.1)
Sodium: 137 mmol/L (ref 135–145)

## 2021-11-19 LAB — CBG MONITORING, ED
Glucose-Capillary: 43 mg/dL — CL (ref 70–99)
Glucose-Capillary: 46 mg/dL — ABNORMAL LOW (ref 70–99)
Glucose-Capillary: 78 mg/dL (ref 70–99)
Glucose-Capillary: 71 mg/dL (ref 70–99)
Glucose-Capillary: 106 mg/dL — ABNORMAL HIGH (ref 70–99)

## 2021-11-19 MED ORDER — SODIUM CHLORIDE 0.9 % IV SOLN: INTRAVENOUS | Status: DC

## 2021-11-19 MED ORDER — LORAZEPAM 1 MG PO TABS
1.0000 mg | ORAL_TABLET | Freq: Once | ORAL | Status: AC
  Administered 2021-11-19: 1 mg via ORAL
  Filled 2021-11-19: qty 1

## 2021-11-19 MED ORDER — DEXTROSE 50 % IV SOLN: 1.0000 | Freq: Once | INTRAVENOUS | Status: DC

## 2021-11-19 NOTE — ED Provider Notes (Addendum)
[place] EMERGENCY DEPARTMENT Provider Note   CSN: [id number] Arrival date & time: 11/19/21  1621     History  Chief Complaint  Patient presents with   Spasms    Zoe Clay is a 46 y.o. female.  Patient presenting with concerns for spasms or tic-like's symptoms that have been ongoing for a while.  Being followed by neurology and her primary for this.  Predominantly in the arms and legs.  Cause not known.  Patient past medical history significant for diabetes thyroid disease hypertension high cholesterol bipolar disease history of seizures but last seizure in 2016.  Said this spasm is not seizure-like.  Has peripheral neuropathy as well and acute schizophrenia.  Past surgical history significant for tubal ligation.  Patient is an everyday smoker.  Patient seen here July 29 for spasms and poorly controlled diabetes.  Of significance it was noted that on May 30 patient had MRI MRA head and neck without any acute findings.  Which is reassuring because that would rule out any kind of tumors.  This was done during an admission for right arm numbness.  Patient was admitted to [facility] Medications Prior to Admission medications   Medication Sig Start Date End Date Taking? Authorizing Provider  acetaminophen (TYLENOL) 500 MG tablet Take 1,000 mg by mouth every 6 (six) hours as needed for moderate pain or headache.    [provider]  aspirin 81 MG EC tablet Take 81 mg by mouth daily. 04/10/16   [provider]  Diclofenac Sodium CR 100 MG 24 hr tablet Take 1 tablet (100 mg total) by mouth daily. Patient not taking: Reported on 09/09/2021 01/01/21   Palumbo, April, MD  DULoxetine (CYMBALTA) 60 MG capsule Take 60 mg by mouth daily.    [provider]  gabapentin (NEURONTIN) 600 MG tablet Take 1 tablet (600 mg total) by mouth 2 (two) times daily. 01/10/15   Katheren Shams, DO  insulin lispro (HUMALOG) 100 UNIT/ML KwikPen Inject 0-8  Units into the skin 3 (three) times daily before meals. Use sliding scale. 09/10/21   Nita Sells, MD  insulin glargine (LANTUS) 100 UNIT/ML Solostar Pen Inject 20 Units into the skin at bedtime. 09/10/21   Nita Sells, MD  Insulin Pen Needle 32G X 4 MM MISC Use to inject insulin 4 times daily as directed. 09/10/21   Nita Sells, MD  levothyroxine (SYNTHROID, LEVOTHROID) 125 MCG tablet Take 125 mcg by mouth daily.      [provider]  lidocaine (LIDODERM) 5 % Place 1 patch onto the skin daily. Remove & Discard patch within 12 hours or as directed by MD Patient not taking: Reported on 09/09/2021 01/01/21   Palumbo, April, MD  lisinopril (PRINIVIL,ZESTRIL) 5 MG tablet Take 5 mg by mouth daily.    [provider]  phenytoin (DILANTIN) 100 MG ER capsule Take 200 mg by mouth 2 (two) times daily.     [provider]  potassium chloride SA (KLOR-CON M) 20 MEQ tablet Take 1 tablet (20 mEq total) by mouth 2 (two) times daily for 3 days. 11/09/21 11/12/21  Wynona Dove A, DO  risperiDONE (RISPERDAL) 0.25 MG tablet Take 0.25 mg by mouth 2 (two) times daily.    [provider]  simvastatin (ZOCOR) 20 MG tablet Take 20 mg by mouth every evening.    [provider]  insulin aspart (NOVOLOG) 100 UNIT/ML injection Inject 0-8 Units into the skin 3 (  three) times daily before meals. Per sliding scale 09/10/21 09/10/21  Nita Sells, MD      Allergies    Contrast media [iodinated contrast media] and Iohexol    Review of Systems   Review of Systems  Constitutional:  Negative for chills and fever.  HENT:  Negative for ear pain and sore throat.   Eyes:  Negative for pain and visual disturbance.  Respiratory:  Negative for cough and shortness of breath.   Cardiovascular:  Negative for chest pain and palpitations.  Gastrointestinal:  Negative for abdominal pain and vomiting.  Genitourinary:  Negative for dysuria and hematuria.  Musculoskeletal:   Negative for arthralgias and back pain.  Skin:  Negative for color change and rash.  Neurological:  Positive for tremors. Negative for seizures and syncope.  All other systems reviewed and are negative.   Physical Exam Updated Vital Signs BP 114/87   Pulse 92   Temp 98 F (36.7 C) (Oral)   Resp 18   Ht 1.651 m ('5\' 5"'$ )   Wt 72.6 kg   SpO2 99%   BMI 26.63 kg/m  Physical Exam Vitals and nursing note reviewed.  Constitutional:      General: She is not in acute distress.    Appearance: Normal appearance. She is well-developed.  HENT:     Head: Normocephalic and atraumatic.  Eyes:     Extraocular Movements: Extraocular movements intact.     Conjunctiva/sclera: Conjunctivae normal.     Pupils: Pupils are equal, round, and reactive to light.  Cardiovascular:     Rate and Rhythm: Normal rate and regular rhythm.     Heart sounds: No murmur heard. Pulmonary:     Effort: Pulmonary effort is normal. No respiratory distress.     Breath sounds: Normal breath sounds.  Abdominal:     Palpations: Abdomen is soft.     Tenderness: There is no abdominal tenderness.  Musculoskeletal:        General: No swelling.     Cervical back: Normal range of motion and neck supple.  Skin:    General: Skin is warm and dry.     Capillary Refill: Capillary refill takes less than 2 seconds.  Neurological:     General: No focal deficit present.     Mental Status: She is alert and oriented to person, place, and time.     Cranial Nerves: No cranial nerve deficit.     Sensory: No sensory deficit.     Motor: No weakness.     Comments: Somewhat of a tick mostly related to the right upper extremity  Psychiatric:        Mood and Affect: Mood normal.     ED Results / Procedures / Treatments   Labs (all labs ordered are listed, but only abnormal results are displayed) Labs Reviewed  CBC WITH DIFFERENTIAL/PLATELET - Abnormal; Notable for the following components:      Result Value   Platelets 438 (*)     Lymphs Abs 4.5 (*)    All other components within normal limits  BASIC METABOLIC PANEL - Abnormal; Notable for the following components:   Glucose, Bld 68 (*)    All other components within normal limits  CBG MONITORING, ED - Abnormal; Notable for the following components:   Glucose-Capillary 43 (*)    All other components within normal limits  CBG MONITORING, ED - Abnormal; Notable for the following components:   Glucose-Capillary 46 (*)    All other components within normal limits  CBG MONITORING, ED - Abnormal; Notable for the following components:   Glucose-Capillary 106 (*)    All other components within normal limits  BASIC METABOLIC PANEL  CBG MONITORING, ED  CBG MONITORING, ED    EKG None  Radiology No results found.  Procedures Procedures    Medications Ordered in ED Medications  0.9 %  sodium chloride infusion (has no administration in time range)  dextrose 50 % solution 50 mL (50 mLs Intravenous Not Given 11/19/21 1850)  LORazepam (ATIVAN) tablet 1 mg (1 mg Oral Given 11/19/21 1932)    ED Course/ Medical Decision Making/ A&P                           Medical Decision Making Amount and/or Complexity of Data Reviewed Labs: ordered.  Risk Prescription drug management.  CRITICAL CARE Performed by: Fredia Sorrow Total critical care time: 35 minutes Critical care time was exclusive of separately billable procedures and treating other patients. Critical care was necessary to treat or prevent imminent or life-threatening deterioration. Critical care was time spent personally by me on the following activities: development of treatment plan with patient and/or surrogate as well as nursing, discussions with consultants, evaluation of patient's response to treatment, examination of patient, obtaining history from patient or surrogate, ordering and performing treatments and interventions, ordering and review of laboratory studies, ordering and review of radiographic  studies, pulse oximetry and re-evaluation of patient's condition.  Patient surprisingly blood sugar was 40 here.  Patient states that she took her sliding scale insulin at about 1400.  But she has not had anything to eat or drink since that time.  We ordered an amp of D50.  But the head struggle to get IV.  Patient was given a snack and her blood sugars came up to around 71 without ever receiving the amp of D50.  We struggled to get a basic metabolic panel on her so we did not know where she was at.  Once we got that her blood sugars are back down to 68.  Patient given a full meal here and blood sugar came up to 106.  Unsure that her regular insulin is wearing off.  Patient also given 1 dose of oral Ativan which has helped the tics.  I do not think that this is a focal seizure but not completely ruled out.  She will follow back up with her neurologist for further evaluation.  Patient is on Neurontin.  Lisinopril and respite all.  Patient not on any seizure medicines.  Patient also on Cymbalta.  Patient now stable for discharge home and follow-up with her neurologist Final Clinical Impression(s) / ED Diagnoses Final diagnoses:  Hypoglycemia  Spasm    Rx / DC Orders ED Discharge Orders     None         Fredia Sorrow, MD 11/19/21 2145    Fredia Sorrow, MD 11/19/21 2146

## 2021-11-19 NOTE — Discharge Instructions (Signed)
Be careful with your sliding scale.  Make sure you are eating properly.  Follow-up with your neurologist and your doctors regarding the spasm which were not sure because of that.  But your blood sugars have now stabilized.

## 2021-11-19 NOTE — ED Notes (Signed)
Pt given microwaved food and orange per doctor order.

## 2021-11-19 NOTE — ED Notes (Signed)
Pt requesting a frozen dinner to eat. Pt selected chicken and dressing and mashed potatoes with OJ to drink.

## 2021-11-19 NOTE — ED Triage Notes (Signed)
C/O "body spasms" in the chest / torso area sometimes in arms and leg;

## 2022-01-14 ENCOUNTER — Emergency Department (HOSPITAL_BASED_OUTPATIENT_CLINIC_OR_DEPARTMENT_OTHER): Payer: Commercial Managed Care - HMO

## 2022-01-14 ENCOUNTER — Other Ambulatory Visit: Payer: Self-pay

## 2022-01-14 ENCOUNTER — Encounter (HOSPITAL_BASED_OUTPATIENT_CLINIC_OR_DEPARTMENT_OTHER): Payer: Self-pay | Admitting: Pediatrics

## 2022-01-14 ENCOUNTER — Emergency Department (HOSPITAL_BASED_OUTPATIENT_CLINIC_OR_DEPARTMENT_OTHER)
Admission: EM | Admit: 2022-01-14 | Discharge: 2022-01-14 | Disposition: A | Discharge status: Home / Self Care | Payer: Commercial Managed Care - HMO | Attending: Emergency Medicine | Admitting: Emergency Medicine

## 2022-01-14 DIAGNOSIS — Z7982 Long term (current) use of aspirin: Secondary | ICD-10-CM | POA: Diagnosis not present

## 2022-01-14 DIAGNOSIS — R200 Anesthesia of skin: Secondary | ICD-10-CM | POA: Diagnosis not present

## 2022-01-14 DIAGNOSIS — Z794 Long term (current) use of insulin: Secondary | ICD-10-CM | POA: Diagnosis not present

## 2022-01-14 DIAGNOSIS — R739 Hyperglycemia, unspecified: Secondary | ICD-10-CM

## 2022-01-14 DIAGNOSIS — E1165 Type 2 diabetes mellitus with hyperglycemia: Secondary | ICD-10-CM | POA: Diagnosis not present

## 2022-01-14 DIAGNOSIS — R519 Headache, unspecified: Secondary | ICD-10-CM | POA: Diagnosis present

## 2022-01-14 DIAGNOSIS — I10 Essential (primary) hypertension: Secondary | ICD-10-CM | POA: Insufficient documentation

## 2022-01-14 DIAGNOSIS — R251 Tremor, unspecified: Secondary | ICD-10-CM | POA: Insufficient documentation

## 2022-01-14 DIAGNOSIS — Z79899 Other long term (current) drug therapy: Secondary | ICD-10-CM | POA: Insufficient documentation

## 2022-01-14 LAB — CBC WITH DIFFERENTIAL/PLATELET
Abs Immature Granulocytes: 0.02 10*3/uL (ref 0.00–0.07)
Basophils Absolute: 0 10*3/uL (ref 0.0–0.1)
Basophils Relative: 0 %
Eosinophils Absolute: 0 10*3/uL (ref 0.0–0.5)
Eosinophils Relative: 0 %
HCT: 32.8 % — ABNORMAL LOW (ref 36.0–46.0)
Hemoglobin: 11.1 g/dL — ABNORMAL LOW (ref 12.0–15.0)
Immature Granulocytes: 0 %
Lymphocytes Relative: 20 %
Lymphs Abs: 1.4 10*3/uL (ref 0.7–4.0)
MCH: 31.2 pg (ref 26.0–34.0)
MCHC: 33.8 g/dL (ref 30.0–36.0)
MCV: 92.1 fL (ref 80.0–100.0)
Monocytes Absolute: 0.2 10*3/uL (ref 0.1–1.0)
Monocytes Relative: 3 %
Neutro Abs: 5.5 10*3/uL (ref 1.7–7.7)
Neutrophils Relative %: 77 %
Platelets: 436 10*3/uL — ABNORMAL HIGH (ref 150–400)
RBC: 3.56 MIL/uL — ABNORMAL LOW (ref 3.87–5.11)
RDW: 17.1 % — ABNORMAL HIGH (ref 11.5–15.5)
WBC: 7.3 10*3/uL (ref 4.0–10.5)
nRBC: 0 % (ref 0.0–0.2)

## 2022-01-14 LAB — COMPREHENSIVE METABOLIC PANEL
ALT: 15 U/L (ref 0–44)
AST: 19 U/L (ref 15–41)
Albumin: 3.4 g/dL — ABNORMAL LOW (ref 3.5–5.0)
Alkaline Phosphatase: 123 U/L (ref 38–126)
Anion gap: 12 (ref 5–15)
BUN: 13 mg/dL (ref 6–20)
CO2: 21 mmol/L — ABNORMAL LOW (ref 22–32)
Calcium: 8.1 mg/dL — ABNORMAL LOW (ref 8.9–10.3)
Chloride: 97 mmol/L — ABNORMAL LOW (ref 98–111)
Creatinine, Ser: 0.83 mg/dL (ref 0.44–1.00)
GFR, Estimated: >60 mL/min (ref >60)
Glucose, Bld: 627 mg/dL (ref 70–99)
Potassium: 4.4 mmol/L (ref 3.5–5.1)
Sodium: 130 mmol/L — ABNORMAL LOW (ref 135–145)
Total Bilirubin: 0.8 mg/dL (ref 0.3–1.2)
Total Protein: 7 g/dL (ref 6.5–8.1)

## 2022-01-14 LAB — I-STAT VENOUS BLOOD GAS, ED
Acid-base deficit: 3 mmol/L — ABNORMAL HIGH (ref 0.0–2.0)
Bicarbonate: 23.2 mmol/L (ref 20.0–28.0)
Calcium, Ion: 1.1 mmol/L — ABNORMAL LOW (ref 1.15–1.40)
HCT: 36 % (ref 36.0–46.0)
Hemoglobin: 12.2 g/dL (ref 12.0–15.0)
O2 Saturation: 60 %
Patient temperature: 98
Potassium: 4.5 mmol/L (ref 3.5–5.1)
Sodium: 130 mmol/L — ABNORMAL LOW (ref 135–145)
TCO2: 25 mmol/L (ref 22–32)
pCO2, Ven: 42.8 mmHg — ABNORMAL LOW (ref 44–60)
pH, Ven: 7.341 (ref 7.25–7.43)
pO2, Ven: 33 mmHg (ref 32–45)

## 2022-01-14 LAB — URINALYSIS, MICROSCOPIC (REFLEX)

## 2022-01-14 LAB — URINALYSIS, ROUTINE W REFLEX MICROSCOPIC
Bilirubin Urine: NEGATIVE
Glucose, UA: >=500 mg/dL — AB
Hgb urine dipstick: NEGATIVE
Ketones, ur: 40 mg/dL — AB
Leukocytes,Ua: NEGATIVE
Nitrite: NEGATIVE
Protein, ur: NEGATIVE mg/dL
Specific Gravity, Urine: 1.01 (ref 1.005–1.030)
pH: 5 (ref 5.0–8.0)

## 2022-01-14 LAB — CBG MONITORING, ED
Glucose-Capillary: 459 mg/dL — ABNORMAL HIGH (ref 70–99)
Glucose-Capillary: 374 mg/dL — ABNORMAL HIGH (ref 70–99)

## 2022-01-14 LAB — LIPASE, BLOOD: Lipase: 21 U/L (ref 11–51)

## 2022-01-14 MED ORDER — PROCHLORPERAZINE MALEATE 10 MG PO TABS
10.0000 mg | ORAL_TABLET | Freq: Once | ORAL | Status: AC
  Administered 2022-01-14: 10 mg via ORAL
  Filled 2022-01-14: qty 1

## 2022-01-14 MED ORDER — DIPHENHYDRAMINE HCL 25 MG PO CAPS
25.0000 mg | ORAL_CAPSULE | Freq: Once | ORAL | Status: AC
  Administered 2022-01-14: 25 mg via ORAL
  Filled 2022-01-14: qty 1

## 2022-01-14 MED ORDER — PROCHLORPERAZINE EDISYLATE 10 MG/2ML IJ SOLN
10.0000 mg | Freq: Once | INTRAMUSCULAR | Status: DC
  Filled 2022-01-14: qty 2

## 2022-01-14 MED ORDER — DIPHENHYDRAMINE HCL 50 MG/ML IJ SOLN
12.5000 mg | Freq: Once | INTRAMUSCULAR | Status: DC
  Filled 2022-01-14: qty 1

## 2022-01-14 MED ORDER — PROCHLORPERAZINE MALEATE 10 MG PO TABS: 10.0000 mg | ORAL_TABLET | Freq: Four times a day (QID) | ORAL | Status: DC | PRN

## 2022-01-14 MED ORDER — INSULIN ASPART 100 UNIT/ML IJ SOLN
8.0000 [IU] | Freq: Once | INTRAMUSCULAR | Status: AC
Start: 2022-01-14 — End: 2022-01-14
  Administered 2022-01-14: 8 [IU] via SUBCUTANEOUS

## 2022-01-14 MED ORDER — SODIUM CHLORIDE 0.9 % IV BOLUS
1000.0000 mL | Freq: Once | INTRAVENOUS | Status: AC
  Administered 2022-01-14: 1000 mL via INTRAVENOUS

## 2022-01-14 MED ORDER — SODIUM CHLORIDE 0.9 % IV BOLUS: 1000.0000 mL | Freq: Once | INTRAVENOUS | Status: DC

## 2022-01-14 NOTE — ED Triage Notes (Signed)
Reported woke up around 1030 this morning and she felt numbness from neck down to both arms; equal grip strength in triage, able to feel sensation on both arms but feels dull. Denies any issues with lower extremities; denies any recent fall or injuries. Reports jerking movements on lower extremity when laying flat. Stated has pending Neurology consult at end of month.

## 2022-01-14 NOTE — ED Notes (Signed)
Results reported to Dr. Ronnald Nian VBG- 59 temp Ph: 7.341 PCO2: 42.8 PO2: 33 BE,B: -3 HCO3: 23.2 TCO2: 25 sO2: 60%  Na:130 K: 4.5 iCa: 1.10 Hct: 36% Hb: 12.2

## 2022-01-14 NOTE — ED Notes (Signed)
Client has consumed PO fluids very well, tolerated without complaints of nausea, took in 3 cups of water, 276m each (total7212m

## 2022-01-14 NOTE — ED Provider Notes (Signed)
  Physical Exam  BP 130/77   Pulse 85   Temp 98 F (36.7 C)   Resp 18   Ht '5\' 5"'$  (1.651 m)   Wt 72.6 kg   SpO2 100%   BMI 26.63 kg/m   Physical Exam  Procedures  Procedures  ED Course / MDM      46 year old female with history of schizophrenia, bipolar disorder, seizures, hypothyroidism, insulin-dependent diabetes, peripheral neuropathy, hypertension, hyperlipidemia, who has had bilateral upper extremity and truncal spasms over the last 3 to 4 months, had a prior MRI brain with and without contrast, MRA head and neck in May 2023, prior emergency department [facility] presentation with these symptoms where she was found to be hyperglycemic, who presents with continued symptoms of numbness and involuntary movements of her trunk and arms.    Given bilateral nature, have low suspicion that this represents a focal seizure.  CT head was completed and shows no evidence of intracranial hemorrhage or other acute abnormalities.  Does not have focal findings to suggest acute CVA.  Does not have weakness to suggest need for transfer for emergent MR CSpine to evaluate for MS or other abnormalities.  Lab work shows hyperglycemia, without signs of DKA.  Do not suspect HHS. No significant hypocalcemia.  Glucose improving after fluid and insulin.  Recommend close follow up with PCP regarding glucose control as well as close follow up with Neurology.        Gareth Morgan, MD 01/14/22 1556

## 2022-01-14 NOTE — ED Provider Notes (Signed)
[place] EMERGENCY DEPARTMENT Provider Note   CSN: [id number] Arrival date & time: 01/14/22  1135     History  Chief Complaint  Patient presents with   Numbness    Zoe Clay is a 46 y.o. female.  Patient here with headache, numbness.  History of insulin-dependent diabetes, bipolar, seizures, peripheral neuropathy, schizophrenia, she continues to have upper body shakes/tics.  She supposed to follow-up with neurology later this month.  She denies any weakness, numbness, chills, vision loss.  She is still with a mild headache.  She states that she gets headaches fairly often.  May be coinsides with some of her numbness and tingling that she had.  She has been having the symptoms on and off for the last several months.  She had MRI imaging recently that was unremarkable.  She states maybe her blood sugars make things worse.  Overall she is feeling much better now.  Still with a mild headache.  Denies any chest pain, abdominal pain, nausea, vomiting.  The history is provided by the patient.       Home Medications Prior to Admission medications   Medication Sig Start Date End Date Taking? Authorizing Provider  acetaminophen (TYLENOL) 500 MG tablet Take 1,000 mg by mouth every 6 (six) hours as needed for moderate pain or headache.    [provider]  aspirin 81 MG EC tablet Take 81 mg by mouth daily. 04/10/16   [provider]  Diclofenac Sodium CR 100 MG 24 hr tablet Take 1 tablet (100 mg total) by mouth daily. Patient not taking: Reported on 09/09/2021 01/01/21   Palumbo, April, MD  DULoxetine (CYMBALTA) 60 MG capsule Take 60 mg by mouth daily.    [provider]  gabapentin (NEURONTIN) 600 MG tablet Take 1 tablet (600 mg total) by mouth 2 (two) times daily. 01/10/15   Katheren Shams, DO  insulin lispro (HUMALOG) 100 UNIT/ML KwikPen Inject 0-8 Units into the skin 3 (three) times daily before meals. Use sliding scale. 09/10/21   Nita Sells, MD  insulin glargine (LANTUS) 100 UNIT/ML Solostar Pen Inject 20 Units into the skin at bedtime. 09/10/21   Nita Sells, MD  Insulin Pen Needle 32G X 4 MM MISC Use to inject insulin 4 times daily as directed. 09/10/21   Nita Sells, MD  levothyroxine (SYNTHROID, LEVOTHROID) 125 MCG tablet Take 125 mcg by mouth daily.      [provider]  lidocaine (LIDODERM) 5 % Place 1 patch onto the skin daily. Remove & Discard patch within 12 hours or as directed by MD Patient not taking: Reported on 09/09/2021 01/01/21   Palumbo, April, MD  lisinopril (PRINIVIL,ZESTRIL) 5 MG tablet Take 5 mg by mouth daily.    [provider]  phenytoin (DILANTIN) 100 MG ER capsule Take 200 mg by mouth 2 (two) times daily.     [provider]  potassium chloride SA (KLOR-CON M) 20 MEQ tablet Take 1 tablet (20 mEq total) by mouth 2 (two) times daily for 3 days. 11/09/21 11/12/21  Wynona Dove A, DO  risperiDONE (RISPERDAL) 0.25 MG tablet Take 0.25 mg by mouth 2 (two) times daily.    [provider]  simvastatin (ZOCOR) 20 MG tablet Take 20 mg by mouth every evening.    [provider]  insulin aspart (NOVOLOG) 100 UNIT/ML injection Inject 0-8 Units into the skin 3 (three) times daily before meals. Per sliding scale 09/10/21 09/10/21  Nita Sells, MD  Allergies    Contrast media [iodinated contrast media] and Iohexol    Review of Systems   Review of Systems  Physical Exam Updated Vital Signs BP (!) 159/88   Pulse 81   Temp 98 F (36.7 C)   Resp 17   Ht '5\' 5"'$  (1.651 m)   Wt 72.6 kg   SpO2 100%   BMI 26.63 kg/m  Physical Exam Vitals and nursing note reviewed.  Constitutional:      General: She is not in acute distress.    Appearance: She is well-developed. She is not ill-appearing.  HENT:     Head: Normocephalic and atraumatic.     Right Ear: Tympanic membrane normal.     Left Ear: Tympanic membrane normal.     Nose: Nose normal.      Mouth/Throat:     Mouth: Mucous membranes are moist.  Eyes:     Extraocular Movements: Extraocular movements intact.     Conjunctiva/sclera: Conjunctivae normal.     Pupils: Pupils are equal, round, and reactive to light.  Cardiovascular:     Rate and Rhythm: Normal rate and regular rhythm.     Pulses: Normal pulses.     Heart sounds: Normal heart sounds. No murmur heard. Pulmonary:     Effort: Pulmonary effort is normal. No respiratory distress.     Breath sounds: Normal breath sounds.  Abdominal:     Palpations: Abdomen is soft.     Tenderness: There is no abdominal tenderness.  Musculoskeletal:        General: No swelling or tenderness. Normal range of motion.     Cervical back: Normal range of motion and neck supple.  Skin:    General: Skin is warm and dry.     Capillary Refill: Capillary refill takes less than 2 seconds.  Neurological:     General: No focal deficit present.     Mental Status: She is alert and oriented to person, place, and time.     Cranial Nerves: No cranial nerve deficit.     Sensory: No sensory deficit.     Motor: No weakness.     Coordination: Coordination normal.     Comments: 5+ out of 5 strength throughout, normal sensation, no drift, normal finger-nose-finger, appears to have may be some tics to her upper extremity with some shaking of the upper arms at times  Psychiatric:        Mood and Affect: Mood normal.     ED Results / Procedures / Treatments   Labs (all labs ordered are listed, but only abnormal results are displayed) Labs Reviewed  CBC WITH DIFFERENTIAL/PLATELET - Abnormal; Notable for the following components:      Result Value   RBC 3.56 (*)    Hemoglobin 11.1 (*)    HCT 32.8 (*)    RDW 17.1 (*)    Platelets 436 (*)    All other components within normal limits  COMPREHENSIVE METABOLIC PANEL - Abnormal; Notable for the following components:   Sodium 130 (*)    Chloride 97 (*)    CO2 21 (*)    Glucose, Bld 627 (*)     Calcium 8.1 (*)    Albumin 3.4 (*)    All other components within normal limits  URINALYSIS, ROUTINE W REFLEX MICROSCOPIC - Abnormal; Notable for the following components:   Glucose, UA >=500 (*)    Ketones, ur 40 (*)    All other components within normal limits  URINALYSIS, MICROSCOPIC (REFLEX) -  Abnormal; Notable for the following components:   Bacteria, UA RARE (*)    All other components within normal limits  I-STAT VENOUS BLOOD GAS, ED - Abnormal; Notable for the following components:   pCO2, Ven 42.8 (*)    Acid-base deficit 3.0 (*)    Sodium 130 (*)    Calcium, Ion 1.10 (*)    All other components within normal limits  LIPASE, BLOOD  CBG MONITORING, ED    EKG EKG Interpretation  Date/Time:  Tuesday January 14 2022 11:57:07 EDT Ventricular Rate:  100 PR Interval:  140 QRS Duration: 76 QT Interval:  348 QTC Calculation: 448 R Axis:   74 Text Interpretation: Normal sinus rhythm When compared with ECG of 09-Nov-2021 19:39, PREVIOUS ECG IS PRESENT Confirmed by Lennice Sites ([id number]) on 01/14/2022 11:59:38 AM  Radiology CT Head Wo Contrast  Result Date: 01/14/2022 CLINICAL DATA:  Headache EXAM: CT HEAD WITHOUT CONTRAST TECHNIQUE: Contiguous axial images were obtained from the base of the skull through the vertex without intravenous contrast. RADIATION DOSE REDUCTION: This exam was performed according to the departmental dose-optimization program which includes automated exposure control, adjustment of the mA and/or kV according to patient size and/or use of iterative reconstruction technique. COMPARISON:  11/29/2021, 09/10/2021 FINDINGS: Brain: No evidence of acute infarction, hemorrhage, hydrocephalus, extra-axial collection or mass lesion/mass effect. Vascular: No hyperdense vessel or unexpected calcification. Skull: Normal. Negative for fracture or focal lesion. Sinuses/Orbits: Unchanged partial opacification of the right sphenoid sinus. Otherwise clear. Other: None. IMPRESSION:  1. No acute intracranial abnormality. 2. Unchanged partial opacification of the right sphenoid sinus. Electronically Signed   By: Davina Poke D.O.   On: 01/14/2022 12:59    Procedures Procedures    Medications Ordered in ED Medications  diphenhydrAMINE (BENADRYL) capsule 25 mg (25 mg Oral Given 01/14/22 1326)  prochlorperazine (COMPAZINE) tablet 10 mg (10 mg Oral Given 01/14/22 1327)  insulin aspart (novoLOG) injection 8 Units (8 Units Subcutaneous Given 01/14/22 1415)  sodium chloride 0.9 % bolus 1,000 mL (1,000 mLs Intravenous New Bag/Given 01/14/22 1410)    ED Course/ Medical Decision Making/ A&P                           Medical Decision Making Amount and/or Complexity of Data Reviewed Labs: ordered. Radiology: ordered.  Risk Prescription drug management.   Zoe Clay is here with headache and numbness.  History of hypertension, bipolar, seizures, schizophrenia, neuropathy, diabetes.  Patient with normal vitals.  No fever.  Had an episode of headache, tingling in her upper arms similar to prior headaches.  She had MRI couple months ago that were unremarkable.  She appears to have tic-like action in her upper extremities on exam.  These appear to be documented in the past.  States that these have been ongoing on and off for the last several months.  Not sure if it is related to stress or sugars which she thinks it might be.  She has not been sleeping well.  Neurologically she appears to be intact.  She complains of a mild headache.  I have no concern for stroke or TIA but will get head CT.  Differential diagnosis includes possibly electrolyte abnormality and will evaluate for DKA.  This is possibly migraine related/complex migraine.  Will give IV fluids, IV Compazine and IV Benadryl and reevaluate.  This could also be acute on chronic neuropathy.  On reevaluation patient is resting, she has no shaking of the upper  extremities.    pH is 7.33, CO2 is 21, anion gap is 12.  While  blood sugar is 627 patient's not in DKA.  She took her short acting insulin this morning did have a little bit to eat and drink prior to coming here but did not take any correction dose.  She is feeling a lot better with IV fluids and Compazine and Benadryl.  Her shakes have improved.  Neurologically she seems to be intact.  Overall she is having some motor tics that seem to be more chronic, possibly functional.  Not sure if these coincide when her blood sugars get elevated but it is possible.  Have given her fluids and insulin here.  She feels comfortable doing her sliding scale at home which she is on for a long time..  She is not confused or encephalopathic and I think it is safe for her to be discharged.  She has follow-up with neurology in place.  Strongly consider her to follow-up with her primary care doctor to continue to work on diabetes plan.  This chart was dictated using voice recognition software.  Despite best efforts to proofread,  errors can occur which can change the documentation meaning.         Final Clinical Impression(s) / ED Diagnoses Final diagnoses:  Hyperglycemia  Shaking    Rx / DC Orders ED Discharge Orders     None         Lennice Sites, DO 01/14/22 1447

## 2022-01-14 NOTE — Discharge Instructions (Addendum)
Continue to do your sliding scale insulin tonight.  Continue outpatient follow-up with neurology.  Follow-up with primary care doctor to continue to work on your diabetes plan.  Suspect that your shaking episodes do somewhat coincide with your blood sugars elevated so it is important to try to keep those as well-controlled as possible.

## 2023-02-11 ENCOUNTER — Emergency Department (HOSPITAL_BASED_OUTPATIENT_CLINIC_OR_DEPARTMENT_OTHER)
Admission: EM | Admit: 2023-02-11 | Discharge: 2023-02-11 | Disposition: A | Discharge status: Home / Self Care | Payer: 59 | Attending: Emergency Medicine | Admitting: Emergency Medicine

## 2023-02-11 ENCOUNTER — Encounter (HOSPITAL_BASED_OUTPATIENT_CLINIC_OR_DEPARTMENT_OTHER): Payer: Self-pay

## 2023-02-11 DIAGNOSIS — Z7982 Long term (current) use of aspirin: Secondary | ICD-10-CM | POA: Insufficient documentation

## 2023-02-11 DIAGNOSIS — E162 Hypoglycemia, unspecified: Secondary | ICD-10-CM | POA: Diagnosis not present

## 2023-02-11 DIAGNOSIS — G40909 Epilepsy, unspecified, not intractable, without status epilepticus: Secondary | ICD-10-CM | POA: Diagnosis not present

## 2023-02-11 DIAGNOSIS — J069 Acute upper respiratory infection, unspecified: Secondary | ICD-10-CM

## 2023-02-11 DIAGNOSIS — Z20822 Contact with and (suspected) exposure to covid-19: Secondary | ICD-10-CM | POA: Diagnosis not present

## 2023-02-11 DIAGNOSIS — J029 Acute pharyngitis, unspecified: Secondary | ICD-10-CM | POA: Diagnosis present

## 2023-02-11 DIAGNOSIS — Z794 Long term (current) use of insulin: Secondary | ICD-10-CM | POA: Diagnosis not present

## 2023-02-11 DIAGNOSIS — B349 Viral infection, unspecified: Secondary | ICD-10-CM | POA: Diagnosis not present

## 2023-02-11 DIAGNOSIS — R569 Unspecified convulsions: Secondary | ICD-10-CM

## 2023-02-11 LAB — CBG MONITORING, ED
Glucose-Capillary: 58 mg/dL — ABNORMAL LOW (ref 70–99)
Glucose-Capillary: 146 mg/dL — ABNORMAL HIGH (ref 70–99)

## 2023-02-11 LAB — RESP PANEL BY RT-PCR (RSV, FLU A&B, COVID)  RVPGX2
Influenza A by PCR: NEGATIVE
Influenza B by PCR: NEGATIVE
Resp Syncytial Virus by PCR: NEGATIVE
SARS Coronavirus 2 by RT PCR: NEGATIVE

## 2023-02-11 MED ORDER — BENZONATATE 100 MG PO CAPS
100.0000 mg | ORAL_CAPSULE | Freq: Three times a day (TID) | ORAL | 0 refills | Status: AC
Start: 2023-02-11 — End: ?

## 2023-02-11 NOTE — ED Triage Notes (Signed)
C/o bodyaches, sore throat, cough, runny nose, fever. Had a seizure this morning, takes keppra but missed her dose this morning. States fell from standing position, unsure if she hit her head.

## 2023-02-11 NOTE — ED Notes (Signed)
CBG 58 , juice and peanut butter crackers offered .

## 2023-02-11 NOTE — ED Provider Notes (Signed)
[facility] Provider Note   CSN: [id number] Arrival date & time: 02/11/23  4098     History  Chief Complaint  Patient presents with   URI   Seizures    Zoe Clay is a 47 y.o. female.  Patient with history of seizures on Keppra presents to the emergency department for 3 to 4 days of respiratory symptoms.  These includes sore throat, generalized bodyaches, nasal congestion, cough, runny nose.  She had a temperature to 100.4 F yesterday.  She has lost her voice.  She had some chest tenderness which was resolved with a muscle relaxer yesterday.  No nausea, vomiting, or diarrhea.  No urinary symptoms.  Patient had a seizure this morning.  She did not take her Keppra this morning.  Last seizure was a couple of months ago.  She has recovered well from her seizure and does not note any unusual symptoms regarding her seizure today.  No headache or signs of injury.       Home Medications Prior to Admission medications   Medication Sig Start Date End Date Taking? Authorizing Provider  acetaminophen (TYLENOL) 500 MG tablet Take 1,000 mg by mouth every 6 (six) hours as needed for moderate pain or headache.    [provider]  aspirin 81 MG EC tablet Take 81 mg by mouth daily. 04/10/16   [provider]  Diclofenac Sodium CR 100 MG 24 hr tablet Take 1 tablet (100 mg total) by mouth daily. Patient not taking: Reported on 09/09/2021 01/01/21   Palumbo, April, MD  DULoxetine (CYMBALTA) 60 MG capsule Take 60 mg by mouth daily.    [provider]  gabapentin (NEURONTIN) 600 MG tablet Take 1 tablet (600 mg total) by mouth 2 (two) times daily. 01/10/15   Pincus Large, DO  insulin lispro (HUMALOG) 100 UNIT/ML KwikPen Inject 0-8 Units into the skin 3 (three) times daily before meals. Use sliding scale. 09/10/21   Rhetta Mura, MD  insulin glargine (LANTUS) 100 UNIT/ML Solostar Pen Inject 20 Units into the skin at  bedtime. 09/10/21   Rhetta Mura, MD  Insulin Pen Needle 32G X 4 MM MISC Use to inject insulin 4 times daily as directed. 09/10/21   Rhetta Mura, MD  levothyroxine (SYNTHROID, LEVOTHROID) 125 MCG tablet Take 125 mcg by mouth daily.      [provider]  lidocaine (LIDODERM) 5 % Place 1 patch onto the skin daily. Remove & Discard patch within 12 hours or as directed by MD Patient not taking: Reported on 09/09/2021 01/01/21   Palumbo, April, MD  lisinopril (PRINIVIL,ZESTRIL) 5 MG tablet Take 5 mg by mouth daily.    [provider]  phenytoin (DILANTIN) 100 MG ER capsule Take 200 mg by mouth 2 (two) times daily.     [provider]  potassium chloride SA (KLOR-CON M) 20 MEQ tablet Take 1 tablet (20 mEq total) by mouth 2 (two) times daily for 3 days. 11/09/21 11/12/21  Tanda Rockers A, DO  risperiDONE (RISPERDAL) 0.25 MG tablet Take 0.25 mg by mouth 2 (two) times daily.    [provider]  simvastatin (ZOCOR) 20 MG tablet Take 20 mg by mouth every evening.    [provider]  insulin aspart (NOVOLOG) 100 UNIT/ML injection Inject 0-8 Units into the skin 3 (three) times daily before meals. Per sliding scale 09/10/21 09/10/21  Rhetta Mura, MD      Allergies    Contrast media [iodinated contrast  media] and Iohexol    Review of Systems   Review of Systems  Physical Exam Updated Vital Signs BP 133/84 (BP Location: Left Arm)   Pulse 84   Temp 97.6 F (36.4 C) (Oral)   Resp 18   Ht 5\' 5"  (1.651 m)   Wt 78.9 kg   SpO2 100%   BMI 28.96 kg/m  Physical Exam Vitals and nursing note reviewed.  Constitutional:      Appearance: She is well-developed.  HENT:     Head: Normocephalic and atraumatic.     Jaw: No trismus.     Comments: No signs of trauma, bruising, ecchymosis.    Right Ear: Tympanic membrane, ear canal and external ear normal.     Left Ear: Tympanic membrane, ear canal and external ear normal.     Nose: Congestion present.  No mucosal edema or rhinorrhea.     Mouth/Throat:     Mouth: Mucous membranes are moist. Mucous membranes are not dry. No oral lesions.     Pharynx: Uvula midline. Posterior oropharyngeal erythema present. No oropharyngeal exudate or uvula swelling.     Tonsils: No tonsillar abscesses.     Comments: Voice is hoarse Eyes:     General:        Right eye: No discharge.        Left eye: No discharge.     Conjunctiva/sclera: Conjunctivae normal.  Neck:     Comments: No C-spine tenderness Cardiovascular:     Rate and Rhythm: Normal rate and regular rhythm.     Heart sounds: Normal heart sounds.  Pulmonary:     Effort: Pulmonary effort is normal. No respiratory distress.     Breath sounds: Wheezing (Upper) present. No rales.  Abdominal:     Palpations: Abdomen is soft.     Tenderness: There is no abdominal tenderness.  Musculoskeletal:     Cervical back: Normal range of motion and neck supple.  Lymphadenopathy:     Cervical: No cervical adenopathy.  Skin:    General: Skin is warm and dry.  Neurological:     Mental Status: She is alert.  Psychiatric:        Mood and Affect: Mood normal.     ED Results / Procedures / Treatments   Labs (all labs ordered are listed, but only abnormal results are displayed) Labs Reviewed  CBG MONITORING, ED - Abnormal; Notable for the following components:      Result Value   Glucose-Capillary 58 (*)    All other components within normal limits  CBG MONITORING, ED - Abnormal; Notable for the following components:   Glucose-Capillary 146 (*)    All other components within normal limits  RESP PANEL BY RT-PCR (RSV, FLU A&B, COVID)  [id number]    EKG None  Radiology No results found.  Procedures Procedures    Medications Ordered in ED Medications - No data to display  ED Course/ Medical Decision Making/ A&P    Patient seen and examined. History obtained directly from patient.   Labs/EKG: CBG was ordered, was low, will give something to  eat and drink  Imaging: None ordered, lungs are clear other than some mild wheezing in the upper fields  Medications/Fluids: None ordered  Most recent vital signs reviewed and are as follows: BP 133/84 (BP Location: Left Arm)   Pulse 84   Temp 97.6 F (36.4 C) (Oral)   Resp 18   Ht 5\' 5"  (1.651 m)   Wt 78.9 kg   SpO2  100%   BMI 28.96 kg/m   Initial impression: Viral URI, seizure reported with recovery  10:37 AM Reassessment performed. Patient appears well, stable.  Labs personally reviewed and interpreted including: Flu/COVID-negative, blood sugar improved after eating and drinking to 146.  Reviewed pertinent lab work and imaging with patient at bedside. Questions answered.   Most current vital signs reviewed and are as follows: BP 133/84 (BP Location: Left Arm)   Pulse 84   Temp 97.6 F (36.4 C) (Oral)   Resp 18   Ht 5\' 5"  (1.651 m)   Wt 78.9 kg   SpO2 100%   BMI 28.96 kg/m   Plan: Discharge to home.   Prescriptions written for: Tessalon  Other home care instructions discussed: OTC treatments, patient encouraged to take Keppra as prescribed  ED return instructions discussed: Additional seizure-like activity, worsening shortness of breath or trouble breathing, new or worsening symptoms.  Follow-up instructions discussed: Patient encouraged to follow-up with their PCP in 3-5 days if not improving.                                     Medical Decision Making Risk Prescription drug management.   Patient with symptoms consistent with a viral syndrome. Vitals are stable, no fever. No signs of dehydration. Lung exam normal, no signs of pneumonia.  Flu and COVID-negative.  No hypoxia or tachypnea.  Supportive therapy indicated with return if symptoms worsen.    Isolated seizure, typical for patient's history.  Patient has fully recovered.  No significant injury suspected from seizure.  She is on Keppra, missed dose this morning.  She also has an infection which may  lower her seizure threshold.  No indications for further workup or evaluation at this time, along she does not have additional seizure-like activity.         Final Clinical Impression(s) / ED Diagnoses Final diagnoses:  Upper respiratory tract infection, unspecified type  Seizure (HCC)  Hypoglycemia    Rx / DC Orders ED Discharge Orders          Ordered    benzonatate (TESSALON) 100 MG capsule  Every 8 hours        02/11/23 1035              Renne Crigler, PA-C 02/11/23 1039    Lonell Grandchild, MD 02/11/23 1153

## 2023-02-11 NOTE — Discharge Instructions (Signed)
Please read and follow all provided instructions.  Your diagnoses today include:  1. Upper respiratory tract infection, unspecified type   2. Seizure (HCC)   3. Hypoglycemia     You appear to have an upper respiratory infection (URI). An upper respiratory tract infection, or cold, is a viral infection of the air passages leading to the lungs. It should improve gradually after 5-7 days. You may have a lingering cough that lasts for 2- 4 weeks after the infection.  Tests performed today include: Vital signs. See below for your results today.  Flu and COVID testing was negative Your blood sugar was low, improved after crackers  Medications prescribed:  Tessalon Perles - cough suppressant medication  Take any prescribed medications only as directed. Treatment for your infection is aimed at treating the symptoms. There are no medications, such as antibiotics, that will cure your infection.   Home care instructions:  You can take Tylenol and/or Ibuprofen as directed on the packaging for fever reduction and pain relief.    For cough: honey 1/2 to 1 teaspoon (you can dilute the honey in water or another fluid).  You can also use guaifenesin and dextromethorphan for cough. You can use a humidifier for chest congestion and cough.  If you don't have a humidifier, you can sit in the bathroom with the hot shower running.      For sore throat: try warm salt water gargles, cepacol lozenges, throat spray, warm tea or water with lemon/honey, popsicles or ice, or OTC cold relief medicine for throat discomfort.    For congestion: take a daily anti-histamine like Zyrtec, Claritin, and a oral decongestant, such as pseudoephedrine.  You can also use Flonase 1-2 sprays in each nostril daily.    It is important to stay hydrated: drink plenty of fluids (water, gatorade/powerade/pedialyte, juices, or teas) to keep your throat moisturized and help further relieve irritation/discomfort.   Your illness is  contagious and can be spread to others, especially during the first 3 or 4 days. It cannot be cured by antibiotics or other medicines. Take basic precautions such as washing your hands often, covering your mouth when you cough or sneeze, and avoiding public places where you could spread your illness to others.   Please continue drinking plenty of fluids.  Use over-the-counter medicines as needed as directed on packaging for symptom relief.  You may also use ibuprofen or tylenol as directed on packaging for pain or fever.  Do not take multiple medicines containing Tylenol or acetaminophen to avoid taking too much of this medication.  Follow-up instructions: Please follow-up with your primary care provider in the next 3 days for further evaluation of your symptoms if you are not feeling better.   Return instructions:  Please return to the Emergency Department if you experience worsening symptoms.  RETURN IMMEDIATELY IF you develop shortness of breath, confusion or altered mental status, a new rash, become dizzy, faint, or poorly responsive, or are unable to be cared for at home. Please return if you have persistent vomiting and cannot keep down fluids or develop a fever that is not controlled by tylenol or motrin.   Please return if you have any other emergent concerns.  Additional Information:  Your vital signs today were: BP 133/84 (BP Location: Left Arm)   Pulse 84   Temp 97.6 F (36.4 C) (Oral)   Resp 18   Ht 5\' 5"  (1.651 m)   Wt 78.9 kg   SpO2 100%   BMI 28.96  kg/m  If your blood pressure (BP) was elevated above 135/85 this visit, please have this repeated by your doctor within one month. --------------

## 2023-04-17 ENCOUNTER — Encounter (HOSPITAL_BASED_OUTPATIENT_CLINIC_OR_DEPARTMENT_OTHER): Payer: Self-pay | Admitting: Emergency Medicine

## 2023-04-17 ENCOUNTER — Other Ambulatory Visit: Payer: Self-pay

## 2023-04-17 ENCOUNTER — Emergency Department (HOSPITAL_BASED_OUTPATIENT_CLINIC_OR_DEPARTMENT_OTHER)
Admission: EM | Admit: 2023-04-17 | Discharge: 2023-04-18 | Discharge status: Against Medical Advice | Payer: 59 | Attending: Emergency Medicine | Admitting: Emergency Medicine

## 2023-04-17 ENCOUNTER — Emergency Department (HOSPITAL_BASED_OUTPATIENT_CLINIC_OR_DEPARTMENT_OTHER): Payer: 59

## 2023-04-17 DIAGNOSIS — R059 Cough, unspecified: Secondary | ICD-10-CM | POA: Diagnosis present

## 2023-04-17 DIAGNOSIS — Z20822 Contact with and (suspected) exposure to covid-19: Secondary | ICD-10-CM | POA: Diagnosis not present

## 2023-04-17 DIAGNOSIS — R509 Fever, unspecified: Secondary | ICD-10-CM | POA: Insufficient documentation

## 2023-04-17 LAB — RESP PANEL BY RT-PCR (RSV, FLU A&B, COVID)  RVPGX2
Influenza A by PCR: NEGATIVE
Influenza B by PCR: NEGATIVE
Resp Syncytial Virus by PCR: NEGATIVE
SARS Coronavirus 2 by RT PCR: NEGATIVE

## 2023-04-17 NOTE — ED Triage Notes (Signed)
 Pt presents to the ED via with complaints of Cough x 1 day. Pt notes having intermittent fevers yesterday and has been exposed to grandson who was diagnosed with a URI. No meds taken PTA. A&Ox4 at this time. Denies CP or SOB.

## 2023-04-18 NOTE — ED Notes (Signed)
 Pt called 3 x to be roomed but did not answer.

## 2023-10-10 ENCOUNTER — Emergency Department (HOSPITAL_BASED_OUTPATIENT_CLINIC_OR_DEPARTMENT_OTHER)
Admission: EM | Admit: 2023-10-10 | Discharge: 2023-10-10 | Disposition: A | Discharge status: Home / Self Care | Payer: Self-pay | Attending: Emergency Medicine | Admitting: Emergency Medicine

## 2023-10-10 ENCOUNTER — Encounter (HOSPITAL_BASED_OUTPATIENT_CLINIC_OR_DEPARTMENT_OTHER): Payer: Self-pay | Admitting: Emergency Medicine

## 2023-10-10 ENCOUNTER — Other Ambulatory Visit: Payer: Self-pay

## 2023-10-10 DIAGNOSIS — I10 Essential (primary) hypertension: Secondary | ICD-10-CM | POA: Insufficient documentation

## 2023-10-10 DIAGNOSIS — L03211 Cellulitis of face: Secondary | ICD-10-CM | POA: Insufficient documentation

## 2023-10-10 DIAGNOSIS — E1142 Type 2 diabetes mellitus with diabetic polyneuropathy: Secondary | ICD-10-CM | POA: Insufficient documentation

## 2023-10-10 DIAGNOSIS — R220 Localized swelling, mass and lump, head: Secondary | ICD-10-CM | POA: Diagnosis present

## 2023-10-10 MED ORDER — DOXYCYCLINE HYCLATE 100 MG PO TABS
100.0000 mg | ORAL_TABLET | Freq: Once | ORAL | Status: AC
  Administered 2023-10-10: 100 mg via ORAL
  Filled 2023-10-10: qty 1

## 2023-10-10 MED ORDER — DOXYCYCLINE HYCLATE 100 MG PO CAPS: 100.0000 mg | ORAL_CAPSULE | Freq: Two times a day (BID) | ORAL | 0 refills | Status: AC

## 2023-10-10 MED ORDER — IBUPROFEN 800 MG PO TABS
800.0000 mg | ORAL_TABLET | Freq: Once | ORAL | Status: AC
  Administered 2023-10-10: 800 mg via ORAL
  Filled 2023-10-10: qty 1

## 2023-10-10 NOTE — ED Triage Notes (Signed)
 Pt with abscess to LT side face/jaw; swelling noted

## 2023-10-10 NOTE — ED Provider Notes (Signed)
 Emergency Department Provider Note   I have reviewed the triage vital signs and the nursing notes.   HISTORY  Chief Complaint Abscess   HPI Zoe Clay is a 48 y.o. female past history of diabetes presents emergency department with swelling to the left jaw and face with redness and some pain.  Symptoms been present for the past several days.  Initially started as a small bump remind her of prior boils that she has had in other places.  Symptoms of spread.  No fevers.  No difficulty swallowing or breathing.  Past Medical History:  Diagnosis Date   Bipolar 1 disorder (HCC)    Diabetes mellitus    High cholesterol    Hypertension    Peripheral neuropathy    Schizophrenia, acute (HCC)    Seizures (HCC)    last seizure May 2016   Thyroid  disease     Review of Systems  Constitutional: No fever/chills Cardiovascular: Denies chest pain. Respiratory: Denies shortness of breath. Gastrointestinal: No abdominal pain.  No nausea, no vomiting.   Skin: Left jaw swelling.  Neurological: Negative for headaches.  ____________________________________________   PHYSICAL EXAM:  VITAL SIGNS: ED Triage Vitals  Encounter Vitals Group     BP 10/10/23 2119 (!) 152/78     Pulse Rate 10/10/23 2119 (!) 108     Resp 10/10/23 2119 20     Temp 10/10/23 2119 97.9 F (36.6 C)     Temp src --      SpO2 10/10/23 2119 100 %     Weight 10/10/23 2119 178 lb (80.7 kg)     Height 10/10/23 2119 5' 5 (1.651 m)   Constitutional: Alert and oriented. Well appearing and in no acute distress. Eyes: Conjunctivae are normal.  Head: Atraumatic. Nose: No congestion/rhinnorhea. Mouth/Throat: Mucous membranes are moist.  Widely patent oropharynx.  No trismus. Soft submandibular compartment.  Neck: No stridor.  Cardiovascular: Normal rate, regular rhythm. Good peripheral circulation. Grossly normal heart sounds.   Respiratory: Normal respiratory effort.  No retractions. Lungs  CTAB. Gastrointestinal: Soft and nontender. No distention.  Musculoskeletal: No lower extremity tenderness nor edema. No gross deformities of extremities. Neurologic:  Normal speech and language. No gross focal neurologic deficits are appreciated.  Skin:  Skin is warm and dry. 3 x 3 cm area of induration to the angle of the left mandible. Mild erythema. No fluctuance.    ____________________________________________   PROCEDURES  Procedure(s) performed:   Procedures  ULTRASOUND LIMITED SOFT TISSUE/ MUSCULOSKELETAL: left face Indication: Swelling and pain.  Linear probe used to evaluate area of interest in two planes. Findings:  Cellulitis noted. No fluid collection.  Performed by: Dr Darra Images saved electronically  ____________________________________________   INITIAL IMPRESSION / ASSESSMENT AND PLAN / ED COURSE  Pertinent labs & imaging results that were available during my care of the patient were reviewed by me and considered in my medical decision making (see chart for details).   This patient is Presenting for Evaluation of face pain, which does require a range of treatment options, and is a complaint that involves a high risk of morbidity and mortality.  The Differential Diagnoses include cellulitis, abscess, sialoadenitis, dental abscess, etc.  Critical Interventions-    Medications  doxycycline  (VIBRA -TABS) tablet 100 mg (has no administration in time range)  ibuprofen  (ADVIL ) tablet 800 mg (has no administration in time range)    Reassessment after intervention: pain improved.   Radiologic Tests: Considered head/neck imaging but no hard signs of deeper space  or spreading infection.  Defer imaging for now.  Medical Decision Making: Summary:  The patient presents emergency department with swelling to the left jaw.  This appears superficial and consistent with cellulitis.  Considered possible developing/early cutaneous abscess but bedside ultrasound does not  demonstrate a drainable pocket of fluid.  Advised patient to continue warm compresses and will start with antibiotics here as well as at home.  Discussed strict ED return precautions.  Patient's presentation is most consistent with acute, uncomplicated illness.   Disposition: discharge  ____________________________________________  FINAL CLINICAL IMPRESSION(S) / ED DIAGNOSES  Final diagnoses:  Cellulitis of face     NEW OUTPATIENT MEDICATIONS STARTED DURING THIS VISIT:  New Prescriptions   DOXYCYCLINE  (VIBRAMYCIN ) 100 MG CAPSULE    Take 1 capsule (100 mg total) by mouth 2 (two) times daily for 7 days.    Note:  This document was prepared using Dragon voice recognition software and may include unintentional dictation errors.  Fonda Law, MD, [facility]    Khrystal Jeanmarie, Fonda MATSU, MD 10/10/23 2218

## 2023-10-10 NOTE — Discharge Instructions (Signed)
 Please take the antibiotics as prescribed.  You may take Tylenol  and ibuprofen  as needed for pain.  Apply warm compress to the face to help promote healing.  If you develop worsening swelling despite being on antibiotics, difficulty swallowing, difficulty breathing you should return to the emergency department immediately for reevaluation.
